# Patient Record
Sex: Female | Born: 1970 | Race: White | Hispanic: No | Marital: Married | Smoking: Never smoker
Health system: Southern US, Community
[De-identification: ages and names within clinical notes are randomized; demographics above are authoritative.]

## PROBLEM LIST (undated history)

## (undated) DIAGNOSIS — G35 Multiple sclerosis: Secondary | ICD-10-CM

## (undated) DIAGNOSIS — F32A Depression, unspecified: Secondary | ICD-10-CM

## (undated) DIAGNOSIS — E119 Type 2 diabetes mellitus without complications: Secondary | ICD-10-CM

## (undated) DIAGNOSIS — A630 Anogenital (venereal) warts: Secondary | ICD-10-CM

## (undated) DIAGNOSIS — F329 Major depressive disorder, single episode, unspecified: Secondary | ICD-10-CM

## (undated) DIAGNOSIS — H539 Unspecified visual disturbance: Secondary | ICD-10-CM

## (undated) DIAGNOSIS — E7800 Pure hypercholesterolemia, unspecified: Secondary | ICD-10-CM

## (undated) DIAGNOSIS — F419 Anxiety disorder, unspecified: Secondary | ICD-10-CM

## (undated) DIAGNOSIS — R200 Anesthesia of skin: Secondary | ICD-10-CM

## (undated) DIAGNOSIS — C801 Malignant (primary) neoplasm, unspecified: Secondary | ICD-10-CM

## (undated) DIAGNOSIS — I10 Essential (primary) hypertension: Secondary | ICD-10-CM

## (undated) HISTORY — DX: Unspecified visual disturbance: H53.9

## (undated) HISTORY — DX: Anxiety disorder, unspecified: F41.9

## (undated) HISTORY — DX: Multiple sclerosis: G35

## (undated) HISTORY — PX: COLPOSCOPY: SHX161

## (undated) HISTORY — PX: CERVICAL BIOPSY  W/ LOOP ELECTRODE EXCISION: SUR135

## (undated) HISTORY — DX: Major depressive disorder, single episode, unspecified: F32.9

## (undated) HISTORY — DX: Anogenital (venereal) warts: A63.0

## (undated) HISTORY — PX: ABDOMINAL HYSTERECTOMY: SHX81

## (undated) HISTORY — DX: Type 2 diabetes mellitus without complications: E11.9

## (undated) HISTORY — DX: Depression, unspecified: F32.A

## (undated) HISTORY — DX: Malignant (primary) neoplasm, unspecified: C80.1

---

## 1992-10-18 HISTORY — PX: DILATION AND CURETTAGE OF UTERUS: SHX78

## 2016-01-17 DIAGNOSIS — G35 Multiple sclerosis: Secondary | ICD-10-CM

## 2016-01-17 HISTORY — DX: Multiple sclerosis: G35

## 2016-02-03 ENCOUNTER — Observation Stay (HOSPITAL_COMMUNITY): Payer: 59

## 2016-02-03 ENCOUNTER — Emergency Department (HOSPITAL_COMMUNITY): Payer: 59

## 2016-02-03 ENCOUNTER — Encounter (HOSPITAL_COMMUNITY): Payer: Self-pay | Admitting: Emergency Medicine

## 2016-02-03 ENCOUNTER — Inpatient Hospital Stay (HOSPITAL_COMMUNITY)
Admission: EM | Admit: 2016-02-03 | Discharge: 2016-02-09 | DRG: 060 | Disposition: A | Discharge status: Home / Self Care | Payer: 59 | Attending: Internal Medicine | Admitting: Internal Medicine

## 2016-02-03 DIAGNOSIS — R471 Dysarthria and anarthria: Secondary | ICD-10-CM

## 2016-02-03 DIAGNOSIS — J3089 Other allergic rhinitis: Secondary | ICD-10-CM | POA: Diagnosis not present

## 2016-02-03 DIAGNOSIS — I10 Essential (primary) hypertension: Secondary | ICD-10-CM | POA: Diagnosis not present

## 2016-02-03 DIAGNOSIS — R202 Paresthesia of skin: Secondary | ICD-10-CM | POA: Diagnosis present

## 2016-02-03 DIAGNOSIS — Z791 Long term (current) use of non-steroidal anti-inflammatories (NSAID): Secondary | ICD-10-CM

## 2016-02-03 DIAGNOSIS — G969 Disorder of central nervous system, unspecified: Secondary | ICD-10-CM | POA: Diagnosis present

## 2016-02-03 DIAGNOSIS — J309 Allergic rhinitis, unspecified: Secondary | ICD-10-CM | POA: Diagnosis present

## 2016-02-03 DIAGNOSIS — R4701 Aphasia: Secondary | ICD-10-CM | POA: Diagnosis present

## 2016-02-03 DIAGNOSIS — R4781 Slurred speech: Secondary | ICD-10-CM | POA: Diagnosis present

## 2016-02-03 DIAGNOSIS — R208 Other disturbances of skin sensation: Secondary | ICD-10-CM | POA: Diagnosis not present

## 2016-02-03 DIAGNOSIS — T502X5A Adverse effect of carbonic-anhydrase inhibitors, benzothiadiazides and other diuretics, initial encounter: Secondary | ICD-10-CM | POA: Diagnosis not present

## 2016-02-03 DIAGNOSIS — E785 Hyperlipidemia, unspecified: Secondary | ICD-10-CM | POA: Diagnosis present

## 2016-02-03 DIAGNOSIS — R200 Anesthesia of skin: Secondary | ICD-10-CM | POA: Diagnosis present

## 2016-02-03 DIAGNOSIS — R209 Unspecified disturbances of skin sensation: Secondary | ICD-10-CM | POA: Diagnosis present

## 2016-02-03 DIAGNOSIS — G35 Multiple sclerosis: Secondary | ICD-10-CM | POA: Diagnosis not present

## 2016-02-03 DIAGNOSIS — M4802 Spinal stenosis, cervical region: Secondary | ICD-10-CM | POA: Diagnosis present

## 2016-02-03 DIAGNOSIS — E876 Hypokalemia: Secondary | ICD-10-CM | POA: Diagnosis not present

## 2016-02-03 DIAGNOSIS — Z79899 Other long term (current) drug therapy: Secondary | ICD-10-CM

## 2016-02-03 HISTORY — DX: Pure hypercholesterolemia, unspecified: E78.00

## 2016-02-03 HISTORY — DX: Essential (primary) hypertension: I10

## 2016-02-03 LAB — I-STAT CHEM 8, ED
BUN: 17 mg/dL (ref 6–20)
CALCIUM ION: 1.19 mmol/L (ref 1.12–1.23)
CHLORIDE: 103 mmol/L (ref 101–111)
Creatinine, Ser: 0.7 mg/dL (ref 0.44–1.00)
GLUCOSE: 108 mg/dL — AB (ref 65–99)
HCT: 51 % — ABNORMAL HIGH (ref 36.0–46.0)
Hemoglobin: 17.3 g/dL — ABNORMAL HIGH (ref 12.0–15.0)
Potassium: 3.4 mmol/L — ABNORMAL LOW (ref 3.5–5.1)
Sodium: 139 mmol/L (ref 135–145)
TCO2: 22 mmol/L (ref 0–100)

## 2016-02-03 LAB — DIFFERENTIAL
BASOS ABS: 0 10*3/uL (ref 0.0–0.1)
BASOS PCT: 0 %
EOS ABS: 0.2 10*3/uL (ref 0.0–0.7)
Eosinophils Relative: 2 %
Lymphocytes Relative: 32 %
Lymphs Abs: 2.5 10*3/uL (ref 0.7–4.0)
Monocytes Absolute: 0.5 10*3/uL (ref 0.1–1.0)
Monocytes Relative: 6 %
NEUTROS PCT: 60 %
Neutro Abs: 4.7 10*3/uL (ref 1.7–7.7)

## 2016-02-03 LAB — COMPREHENSIVE METABOLIC PANEL
ALBUMIN: 4.6 g/dL (ref 3.5–5.0)
ALT: 26 U/L (ref 14–54)
AST: 23 U/L (ref 15–41)
Alkaline Phosphatase: 61 U/L (ref 38–126)
Anion gap: 14 (ref 5–15)
BUN: 15 mg/dL (ref 6–20)
CHLORIDE: 102 mmol/L (ref 101–111)
CO2: 20 mmol/L — ABNORMAL LOW (ref 22–32)
Calcium: 10.2 mg/dL (ref 8.9–10.3)
Creatinine, Ser: 0.74 mg/dL (ref 0.44–1.00)
GFR calc Af Amer: >60 mL/min (ref >60)
GFR calc non Af Amer: >60 mL/min (ref >60)
GLUCOSE: 104 mg/dL — AB (ref 65–99)
POTASSIUM: 3.5 mmol/L (ref 3.5–5.1)
SODIUM: 136 mmol/L (ref 135–145)
Total Bilirubin: 0.8 mg/dL (ref 0.3–1.2)
Total Protein: 8.7 g/dL — ABNORMAL HIGH (ref 6.5–8.1)

## 2016-02-03 LAB — PROTIME-INR
INR: 1 (ref 0.00–1.49)
Prothrombin Time: 13.4 seconds (ref 11.6–15.2)

## 2016-02-03 LAB — APTT: APTT: 30 s (ref 24–37)

## 2016-02-03 LAB — CBC
HCT: 44.6 % (ref 36.0–46.0)
HEMOGLOBIN: 14.4 g/dL (ref 12.0–15.0)
MCH: 26.1 pg (ref 26.0–34.0)
MCHC: 32.3 g/dL (ref 30.0–36.0)
MCV: 80.9 fL (ref 78.0–100.0)
Platelets: 266 10*3/uL (ref 150–400)
RBC: 5.51 MIL/uL — ABNORMAL HIGH (ref 3.87–5.11)
RDW: 13.9 % (ref 11.5–15.5)
WBC: 7.9 10*3/uL (ref 4.0–10.5)

## 2016-02-03 MED ORDER — VITAMIN C 500 MG PO TABS
500.0000 mg | ORAL_TABLET | Freq: Every day | ORAL | Status: DC
  Administered 2016-02-04 – 2016-02-09 (×6): 500 mg via ORAL
  Filled 2016-02-03 (×6): qty 1

## 2016-02-03 MED ORDER — ONDANSETRON HCL 4 MG/2ML IJ SOLN: 4.0000 mg | Freq: Four times a day (QID) | INTRAMUSCULAR | Status: DC | PRN

## 2016-02-03 MED ORDER — LISINOPRIL-HYDROCHLOROTHIAZIDE 20-12.5 MG PO TABS: 1.0000 | ORAL_TABLET | Freq: Every day | ORAL | Status: DC

## 2016-02-03 MED ORDER — HYDROCODONE-ACETAMINOPHEN 5-325 MG PO TABS: 1.0000 | ORAL_TABLET | ORAL | Status: DC | PRN

## 2016-02-03 MED ORDER — NAPROXEN SODIUM 220 MG PO TABS: 220.0000 mg | ORAL_TABLET | Freq: Every day | ORAL | Status: DC

## 2016-02-03 MED ORDER — BISACODYL 5 MG PO TBEC: 5.0000 mg | DELAYED_RELEASE_TABLET | Freq: Every day | ORAL | Status: DC | PRN

## 2016-02-03 MED ORDER — VITAMIN D 1000 UNITS PO TABS
1000.0000 [IU] | ORAL_TABLET | Freq: Every day | ORAL | Status: DC
  Administered 2016-02-04 – 2016-02-09 (×6): 1000 [IU] via ORAL
  Filled 2016-02-03 (×8): qty 1

## 2016-02-03 MED ORDER — MONTELUKAST SODIUM 10 MG PO TABS
10.0000 mg | ORAL_TABLET | Freq: Every day | ORAL | Status: DC
  Administered 2016-02-04 – 2016-02-08 (×5): 10 mg via ORAL
  Filled 2016-02-03 (×5): qty 1

## 2016-02-03 MED ORDER — ACETAMINOPHEN 325 MG PO TABS: 650.0000 mg | ORAL_TABLET | Freq: Four times a day (QID) | ORAL | Status: DC | PRN

## 2016-02-03 MED ORDER — ONDANSETRON HCL 4 MG PO TABS: 4.0000 mg | ORAL_TABLET | Freq: Four times a day (QID) | ORAL | Status: DC | PRN

## 2016-02-03 MED ORDER — LORATADINE 10 MG PO TABS
10.0000 mg | ORAL_TABLET | Freq: Every day | ORAL | Status: DC
  Administered 2016-02-04 – 2016-02-09 (×6): 10 mg via ORAL
  Filled 2016-02-03 (×6): qty 1

## 2016-02-03 MED ORDER — ACETAMINOPHEN 650 MG RE SUPP: 650.0000 mg | Freq: Four times a day (QID) | RECTAL | Status: DC | PRN

## 2016-02-03 MED ORDER — ENOXAPARIN SODIUM 40 MG/0.4ML ~~LOC~~ SOLN
40.0000 mg | SUBCUTANEOUS | Status: DC
  Administered 2016-02-04 – 2016-02-08 (×6): 40 mg via SUBCUTANEOUS
  Filled 2016-02-03 (×6): qty 0.4

## 2016-02-03 MED ORDER — POLYETHYLENE GLYCOL 3350 17 G PO PACK: 17.0000 g | PACK | Freq: Every day | ORAL | Status: DC | PRN

## 2016-02-03 NOTE — ED Notes (Signed)
Ptmoob to br with steady gait.

## 2016-02-03 NOTE — Consult Note (Signed)
Neurology Consultation Reason for Consult: dysarthria and left hand tingling Referring Physician: ED  CC: as above  History is obtained from patient  HPI: Beverly Beltran is a 45 y.o. female hx of ebesity, HTN and prediabetes.  P/w dysarthria starting last Wednesday and without improvement since.  Around same time also had left arm numbness.  Denies any prior neurological sx.  This week has had trouble urinating.  pateint takes aleve every day.  occassional gait unsteadiness as well. No fevers or chills.  No fam hx of MS or autoimmune dz    ROS: A 14 point ROS was performed and is negative except as noted in the HPI.  Past Medical History  Diagnosis Date  . Hypertension   . Hypercholesteremia     History reviewed. No pertinent family history.  Social History:  reports that she has never smoked. She does not have any smokeless tobacco history on file. She reports that she does not drink alcohol or use illicit drugs.  Exam: Current vital signs: BP 110/66 mmHg  Pulse 76  Temp(Src) 98.9 F (37.2 C) (Oral)  Resp 10  SpO2 99%  LMP 01/16/2016 Vital signs in last 24 hours: Temp:  [98.9 F (37.2 C)] 98.9 F (37.2 C) (04/18 1454) Pulse Rate:  [65-94] 76 (04/18 2200) Resp:  [8-19] 10 (04/18 1900) BP: (101-146)/(59-103) 110/66 mmHg (04/18 2200) SpO2:  [96 %-100 %] 99 % (04/18 2200)   Physical Exam  Constitutional: Appears well-developed and well-nourished.  Psych: Affect appropriate to situation Eyes: No scleral injection HENT: No OP obstrucion Head: Normocephalic.  Cardiovascular: Normal rate and regular rhythm.  Respiratory: Effort normal and breath sounds normal to anterior ascultation GI: Soft.  No distension. There is no tenderness.  Skin: WDI  Neuro: Mental Status: Patient is awake, alert, oriented to person, place, month, year, and situation Patient is able to give a clear and coherent history No signs of aphasia or neglect but she has minor but obvious  dysarthria Cranial Nerves: II: Visual Fields are full. Pupils are equal, round, and reactive to light. III,IV, VI: EOMI without ptosis or diploplia.  V: Facial sensation is symmetric to temperature VII: Facial movement is symmetric.  VIII: hearing is intact to voice X: Uvula elevates symmetrically XI: Shoulder shrug is symmetric. XII: tongue is midline without atrophy or fasciculations.  Motor: Tone is normal. Bulk is normal. 5/5 strength was present in all four extremities. Sensory: Decreased sensation in left arm - else normal Deep Tendon Reflexes: 2+ and symmetric in the biceps and patellae Plantars: Toes are downgoing bilaterally. Cerebellar: FNF and HKS are intact bilaterally    I have reviewed labs and imaging in epic and the results pertinent to this consultation are:  MRI and MRI with contrast C and T spine MRI   Impression: possible MS - needs C and T spine MRI for dx.  Also needs MRI +/- to better understand lesions.  Will sign out to to nandigan.  Will follow MRI ;ater and decide whether steroids are necessary.  Recommendations: 1) as above

## 2016-02-03 NOTE — ED Notes (Signed)
Patient transported to MRI 

## 2016-02-03 NOTE — H&P (Signed)
History and Physical    Beverly Beltran IWO:[id number] DOB: 03-09-1971 DOA: 02/03/2016  Referring Provider: EDP PCP: No primary care provider on file.  Outpatient Specialists: None listed  Patient coming from: home   Chief Complaint: Slurred speech  HPI: Beverly Beltran is a 45 y.o. female with medical history significant for hypertension, hyperlipidemia, and allergic rhinitis who presents to the ED with slurred speech and left arm paresthesia of 6 days duration. Patient reports that she typically enjoys fairly good health and was at her baseline until approximately 6 days ago when she developed some slurred speech. She had been in the process of moving with her family from [place] and attributed the speech changed to her exhaustion after moving boxes all day. The next day, the speech difficulty persisted and now remains unchanged from time of onset. Over the same interval, the patient is noted numbness in her left upper extremity extending from near the shoulder down through to the hand. Patient denies any chest pain or palpitations. She denies any headaches, vision change, or hearing loss. There is been no loss of coordination or confusion. Patient does not drink alcohol and denies use of illicit drugs. She has never experienced similar symptoms previously.   ED Course: Upon arrival to the ED, patient is found to be afebrile, saturating well on room air, and with vital signs stable. EKG features a normal sinus rhythm and head CT without contrast is negative for acute intracranial abnormality. CMP is unremarkable and CBC is entirely within the normal limits. MRI of the brain was obtained and notable for a 16 mm dominant lesion in the subcortical white matter of the left middle frontal gyrus with suspicion for MS. Neurology was consulted by the EDP and has evaluated the patient in the ER. Admission to the hospital as recommended with MRI of the spinal cord. Neurology will follow up on the imaging and  make a determination as to whether steroids are recommended.  Review of Systems:  All other systems reviewed and apart from HPI, are negative.  Past Medical History  Diagnosis Date  . Hypertension   . Hypercholesteremia     History reviewed. No pertinent past surgical history.   reports that she has never smoked. She does not have any smokeless tobacco history on file. She reports that she does not drink alcohol or use illicit drugs.  No Known Allergies  History reviewed. No pertinent family history.   Prior to Admission medications   Medication Sig Start Date End Date Taking? Authorizing Provider  cetirizine (ZYRTEC) 10 MG tablet Take 10 mg by mouth daily.   Yes Historical Provider, MD  cholecalciferol (VITAMIN D) 1000 units tablet Take 1,000 Units by mouth daily.   Yes Historical Provider, MD  Cyanocobalamin (VITAMIN B 12 PO) Take 1 tablet by mouth daily.    Yes Historical Provider, MD  Lactobacillus (PROBIOTIC ACIDOPHILUS PO) Take 1 capsule by mouth daily.   Yes Historical Provider, MD  lisinopril-hydrochlorothiazide (PRINZIDE,ZESTORETIC) 20-12.5 MG tablet Take 1 tablet by mouth daily.   Yes Historical Provider, MD  MAGNESIUM PO Take by mouth.   Yes Historical Provider, MD  montelukast (SINGULAIR) 10 MG tablet Take 10 mg by mouth at bedtime.   Yes Historical Provider, MD  naproxen sodium (ANAPROX) 220 MG tablet Take 220 mg by mouth at bedtime.   Yes Historical Provider, MD  vitamin C (ASCORBIC ACID) 500 MG tablet Take 500 mg by mouth daily.   Yes Historical Provider, MD    Physical Exam: Danley Danker  Vitals:   02/03/16 2145 02/03/16 2200 02/03/16 2230 02/03/16 2300  BP: 132/84 110/66 109/63 107/62  Pulse: 84 76 78 78  Temp:      TempSrc:      Resp:      SpO2: 99% 99% 99% 98%      Constitutional: NAD, calm, comfortable Eyes: PERTLA, lids and conjunctivae normal ENMT: Mucous membranes are moist. Posterior pharynx clear of any exudate or lesions. Normal dentition.  Neck:  normal, supple, no masses, no thyromegaly Respiratory: clear to auscultation bilaterally, no wheezing, no crackles. Normal respiratory effort. No accessory muscle use.  Cardiovascular: S1 & S2 heard, regular rate and rhythm, no murmurs / rubs / gallops. No extremity edema. 2+ pedal pulses. No carotid bruits.  Abdomen: No distension, no tenderness, no masses palpated. No hepatosplenomegaly. Bowel sounds normal.  Musculoskeletal: no clubbing / cyanosis. No joint deformity upper and lower extremities. Good ROM, no contractures. Normal muscle tone.  Skin: no rashes, lesions, ulcers. No induration Neurologic: CN 2-12 grossly intact. Sensation intact, DTR normal. Strength 5/5 in all 4 limbs.  Psychiatric: Normal judgment and insight. Alert and oriented x 3. Normal mood.     Labs on Admission: I have personally reviewed following labs and imaging studies  CBC:  Recent Labs Lab 02/03/16 1426 02/03/16 1454  WBC 7.9  --   NEUTROABS 4.7  --   HGB 14.4 17.3*  HCT 44.6 51.0*  MCV 80.9  --   PLT 266  --    Basic Metabolic Panel:  Recent Labs Lab 02/03/16 1426 02/03/16 1454  NA 136 139  K 3.5 3.4*  CL 102 103  CO2 20*  --   GLUCOSE 104* 108*  BUN 15 17  CREATININE 0.74 0.70  CALCIUM 10.2  --    GFR: CrCl cannot be calculated (Unknown ideal weight.). Liver Function Tests:  Recent Labs Lab 02/03/16 1426  AST 23  ALT 26  ALKPHOS 61  BILITOT 0.8  PROT 8.7*  ALBUMIN 4.6   No results for input(s): LIPASE, AMYLASE in the last 168 hours. No results for input(s): AMMONIA in the last 168 hours. Coagulation Profile:  Recent Labs Lab 02/03/16 1426  INR 1.00   Cardiac Enzymes: No results for input(s): CKTOTAL, CKMB, CKMBINDEX, TROPONINI in the last 168 hours. BNP (last 3 results) No results for input(s): PROBNP in the last 8760 hours. HbA1C: No results for input(s): HGBA1C in the last 72 hours. CBG: No results for input(s): GLUCAP in the last 168 hours. Lipid Profile: No  results for input(s): CHOL, HDL, LDLCALC, TRIG, CHOLHDL, LDLDIRECT in the last 72 hours. Thyroid Function Tests: No results for input(s): TSH, T4TOTAL, FREET4, T3FREE, THYROIDAB in the last 72 hours. Anemia Panel: No results for input(s): VITAMINB12, FOLATE, FERRITIN, TIBC, IRON, RETICCTPCT in the last 72 hours. Urine analysis: No results found for: COLORURINE, APPEARANCEUR, LABSPEC, PHURINE, GLUCOSEU, HGBUR, BILIRUBINUR, KETONESUR, PROTEINUR, UROBILINOGEN, NITRITE, LEUKOCYTESUR Sepsis Labs: @LABRCNTIP (procalcitonin:4,lacticidven:4) )No results found for this or any previous visit (from the past 240 hour(s)).   Radiological Exams on Admission: Ct Head Wo Contrast  02/03/2016  CLINICAL DATA:  Slurred speech for 6 days. Gait disturbance. Dyscoordination. EXAM: CT HEAD WITHOUT CONTRAST TECHNIQUE: Contiguous axial images were obtained from the base of the skull through the vertex without intravenous contrast. COMPARISON:  None. FINDINGS: The lateral and third ventricles are normal in size and configuration. There is enlargement of the fourth ventricle. 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 There is no intracranial mass,  hemorrhage, extra-axial fluid collection, or midline shift. No gray or white compartment lesions are evident. No acute infarct evident. The bony calvarium appears intact. The mastoid air cells are clear. No intraorbital lesions are evident. 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 Lateral and third ventricles appear normal. No intracranial mass, hemorrhage, or focal gray - white compartment lesion. No acute infarct evident. Electronically Signed   By: Lowella Grip III M.D.   On: 02/03/2016 14:45   Mr Brain Wo Contrast  02/03/2016  CLINICAL DATA:  45 year old female with slurred speech for 6 days. Mild facial droop, dropping things with her left hand this week. Initial encounter.  EXAM: MRI HEAD WITHOUT CONTRAST TECHNIQUE: Multiplanar, multiecho pulse sequences of the brain and surrounding structures were obtained without intravenous contrast. COMPARISON:  Noncontrast head CT 1438 hours today FINDINGS: 16 mm round area of abnormal white matter T2 and FLAIR hyperintensity associated with increased trace diffusion signal, but appears facilitated on ADC. This is in the left middle frontal gyrus (series 10, image 16) in proximity to Broca's area. Mild if any associated mass effect. This is largely occult on the earlier CT. Multiple additional smaller bilateral cerebral white matter lesions including some periventricular and subcortical areas of involvement elsewhere in both hemispheres. The temporal lobes appear spared. No definite corpus callosum involvement. None of these are restricted or conspicuous on diffusion weighted imaging. Cerebral volume is within normal limits. Mega cisterna magna variant. No restricted diffusion to suggest acute infarction. No midline shift, ventriculomegaly, extra-axial collection or acute intracranial hemorrhage. Cervicomedullary junction and pituitary are within normal limits. Major intracranial vascular flow voids are preserved with mild vertebrobasilar tortuosity. No cortical encephalomalacia or chronic cerebral blood products. Signal in the deep gray matter, brainstem, and cerebellum is normal. Visible internal auditory structures appear normal. Mastoids are clear. Trace paranasal sinus mucosal thickening. Negative orbit soft tissues. Grossly negative visualized cervical spine. Negative scalp soft tissues. Visualized bone marrow signal is within normal limits. IMPRESSION: 1. Dominant lesion measuring 16 mm in the subcortical white matter of the left middle frontal gyrus is favored to be the symptomatic abnormality. The noncontrast appearance of that lesion, as well as the presence of multiple additional age advanced white matter signal changes, favors Multiple  Sclerosis. Other inflammatory, infectious, or neoplastic considerations are all less likely. Recommend Neurology consultation. 2. No deep gray matter or posterior fossa involvement, and otherwise negative noncontrast MRI appearance of the brain. Electronically Signed   By: Genevie Ann M.D.   On: 02/03/2016 20:24    EKG: Independently reviewed. Sinus rhythm  Assessment/Plan  1. Aphasia, left arm paresthesia  - Subcortical white matter lesion noted on MRI suspicious for MS  - Neurology consulting and much appreciated  - Spinal cord MRI pending; neurology to review and decide on whether to treat with steroids   2. Hypertension  - At goal currently  - Managed with lisinopril and HCTZ at home, will continue    3. Allergic rhinitis   - Stable  - Continue Singulair, antihistamine     DVT prophylaxis: sq Lovenox  Code Status: Full   Family Communication: Husband at bedside  Disposition Plan: Admit to med-surg  Consults called: Neurology   Admission status: Observation   Vianne Bulls, MD [facility] Pager [phone]  If 7PM-7AM, please contact night-coverage [email or web address] Password [facility]  02/03/2016, 11:24 PM

## 2016-02-03 NOTE — ED Provider Notes (Signed)
CSN: [id number]     Arrival date & time 02/03/16  1347 History   First MD Initiated Contact with Patient 02/03/16 1503     Chief Complaint  Patient presents with  . Aphasia     (Consider location/radiation/quality/duration/timing/severity/associated sxs/prior Treatment) Patient is a 45 y.o. female presenting with weakness. The history is provided by the patient (Patient states Wednesday night she noticed that she would occasionally slur speech she's also had some occasional numbness and weakness in her left hand).  Weakness This is a new problem. The current episode started more than 2 days ago. The problem occurs constantly. The problem has not changed since onset.Pertinent negatives include no chest pain, no abdominal pain and no headaches. Nothing aggravates the symptoms. Nothing relieves the symptoms.    Past Medical History  Diagnosis Date  . Hypertension   . Hypercholesteremia    History reviewed. No pertinent past surgical history. History reviewed. No pertinent family history. Social History  Substance Use Topics  . Smoking status: Never Smoker   . Smokeless tobacco: None  . Alcohol Use: No   OB History    No data available     Review of Systems  Constitutional: Negative for appetite change and fatigue.  HENT: Negative for congestion, ear discharge and sinus pressure.   Eyes: Negative for discharge.  Respiratory: Negative for cough.   Cardiovascular: Negative for chest pain.  Gastrointestinal: Negative for abdominal pain and diarrhea.  Genitourinary: Negative for frequency and hematuria.  Musculoskeletal: Negative for back pain.  Skin: Negative for rash.  Neurological: Positive for speech difficulty and weakness. Negative for seizures and headaches.  Psychiatric/Behavioral: Negative for hallucinations.      Allergies  Review of patient's allergies indicates no known allergies.  Home Medications   Prior to Admission medications   Medication Sig Start Date  End Date Taking? Authorizing Provider  cetirizine (ZYRTEC) 10 MG tablet Take 10 mg by mouth daily.   Yes Historical Provider, MD  cholecalciferol (VITAMIN D) 1000 units tablet Take 1,000 Units by mouth daily.   Yes Historical Provider, MD  Cyanocobalamin (VITAMIN B 12 PO) Take 1 tablet by mouth daily.    Yes Historical Provider, MD  Lactobacillus (PROBIOTIC ACIDOPHILUS PO) Take 1 capsule by mouth daily.   Yes Historical Provider, MD  lisinopril-hydrochlorothiazide (PRINZIDE,ZESTORETIC) 20-12.5 MG tablet Take 1 tablet by mouth daily.   Yes Historical Provider, MD  MAGNESIUM PO Take by mouth.   Yes Historical Provider, MD  montelukast (SINGULAIR) 10 MG tablet Take 10 mg by mouth at bedtime.   Yes Historical Provider, MD  naproxen sodium (ANAPROX) 220 MG tablet Take 220 mg by mouth at bedtime.   Yes Historical Provider, MD  vitamin C (ASCORBIC ACID) 500 MG tablet Take 500 mg by mouth daily.   Yes Historical Provider, MD   BP 112/70 mmHg  Pulse 75  Temp(Src) 98.9 F (37.2 C) (Oral)  Resp 15  SpO2 100% Physical Exam  Constitutional: She is oriented to person, place, and time. She appears well-developed.  HENT:  Head: Normocephalic.  Eyes: Conjunctivae and EOM are normal. No scleral icterus.  Neck: Neck supple. No thyromegaly present.  Cardiovascular: Normal rate and regular rhythm.  Exam reveals no gallop and no friction rub.   No murmur heard. Pulmonary/Chest: No stridor. She has no wheezes. She has no rales. She exhibits no tenderness.  Abdominal: She exhibits no distension. There is no tenderness. There is no rebound.  Musculoskeletal: Normal range of motion. She exhibits no edema.  Lymphadenopathy:    She has no cervical adenopathy.  Neurological: She is oriented to person, place, and time. She exhibits normal muscle tone. Coordination normal.  Very minimal slurring of speech. Sounds like patient has a very minor lisp at times  Skin: No rash noted. No erythema.  Psychiatric: She has a  normal mood and affect. Her behavior is normal.    ED Course  Procedures (including critical care time) Labs Review Labs Reviewed  CBC - Abnormal; Notable for the following:    RBC 5.51 (*)    All other components within normal limits  COMPREHENSIVE METABOLIC PANEL - Abnormal; Notable for the following:    CO2 20 (*)    Glucose, Bld 104 (*)    Total Protein 8.7 (*)    All other components within normal limits  I-STAT CHEM 8, ED - Abnormal; Notable for the following:    Potassium 3.4 (*)    Glucose, Bld 108 (*)    Hemoglobin 17.3 (*)    HCT 51.0 (*)    All other components within normal limits  PROTIME-INR  APTT  DIFFERENTIAL  I-STAT TROPOININ, ED  CBG MONITORING, ED    Imaging Review Ct Head Wo Contrast  02/03/2016  CLINICAL DATA:  Slurred speech for 6 days. Gait disturbance. Dyscoordination. EXAM: CT HEAD WITHOUT CONTRAST TECHNIQUE: Contiguous axial images were obtained from the base of the skull through the vertex without intravenous contrast. COMPARISON:  None. FINDINGS: The lateral and third ventricles are normal in size and configuration. There is enlargement of the fourth ventricle. 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 There is no intracranial mass, hemorrhage, extra-axial fluid collection, or midline shift. No gray or white compartment lesions are evident. No acute infarct evident. The bony calvarium appears intact. The mastoid air cells are clear. No intraorbital lesions are evident. 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 Lateral and third ventricles appear normal. No intracranial mass, hemorrhage, or focal gray - white compartment lesion. No acute infarct evident. Electronically Signed   By: Lowella Grip III M.D.   On: 02/03/2016 14:45   I have personally reviewed and evaluated these images and lab results as part of my medical decision-making.   EKG  Interpretation None      MDM   Final diagnoses:  None    Slurred speech.  Mri suggests ms   Milton Ferguson, MD 02/04/16 1319

## 2016-02-03 NOTE — ED Notes (Signed)
Pt here with slurred speech and aphasia x 6 days; pt noted to have mild facial droop; pt sts dropping some things with left hand this week

## 2016-02-03 NOTE — ED Notes (Signed)
Dr. Wendee Beavers and Rapid response at bedside

## 2016-02-03 NOTE — ED Notes (Signed)
IV attempted x2 without success.

## 2016-02-03 NOTE — ED Notes (Signed)
Pt awaiting MRI

## 2016-02-04 DIAGNOSIS — E785 Hyperlipidemia, unspecified: Secondary | ICD-10-CM

## 2016-02-04 DIAGNOSIS — E876 Hypokalemia: Secondary | ICD-10-CM | POA: Diagnosis not present

## 2016-02-04 DIAGNOSIS — J309 Allergic rhinitis, unspecified: Secondary | ICD-10-CM | POA: Diagnosis present

## 2016-02-04 DIAGNOSIS — R4701 Aphasia: Secondary | ICD-10-CM

## 2016-02-04 DIAGNOSIS — Z79899 Other long term (current) drug therapy: Secondary | ICD-10-CM | POA: Diagnosis not present

## 2016-02-04 DIAGNOSIS — R200 Anesthesia of skin: Secondary | ICD-10-CM | POA: Diagnosis present

## 2016-02-04 DIAGNOSIS — J3089 Other allergic rhinitis: Secondary | ICD-10-CM | POA: Diagnosis present

## 2016-02-04 DIAGNOSIS — T502X5A Adverse effect of carbonic-anhydrase inhibitors, benzothiadiazides and other diuretics, initial encounter: Secondary | ICD-10-CM | POA: Diagnosis not present

## 2016-02-04 DIAGNOSIS — M4802 Spinal stenosis, cervical region: Secondary | ICD-10-CM

## 2016-02-04 DIAGNOSIS — R202 Paresthesia of skin: Secondary | ICD-10-CM

## 2016-02-04 DIAGNOSIS — G35 Multiple sclerosis: Secondary | ICD-10-CM | POA: Diagnosis present

## 2016-02-04 DIAGNOSIS — R209 Unspecified disturbances of skin sensation: Secondary | ICD-10-CM | POA: Diagnosis present

## 2016-02-04 DIAGNOSIS — I10 Essential (primary) hypertension: Secondary | ICD-10-CM | POA: Diagnosis present

## 2016-02-04 DIAGNOSIS — Z791 Long term (current) use of non-steroidal anti-inflammatories (NSAID): Secondary | ICD-10-CM | POA: Diagnosis not present

## 2016-02-04 DIAGNOSIS — R4781 Slurred speech: Secondary | ICD-10-CM | POA: Diagnosis present

## 2016-02-04 DIAGNOSIS — G969 Disorder of central nervous system, unspecified: Secondary | ICD-10-CM | POA: Diagnosis not present

## 2016-02-04 LAB — GLUCOSE, CAPILLARY: GLUCOSE-CAPILLARY: 106 mg/dL — AB (ref 65–99)

## 2016-02-04 MED ORDER — SODIUM CHLORIDE 0.9 % IV SOLN
1000.0000 mg | Freq: Every day | INTRAVENOUS | Status: DC
  Administered 2016-02-04 – 2016-02-05 (×2): 1000 mg via INTRAVENOUS
  Filled 2016-02-04 (×2): qty 8

## 2016-02-04 MED ORDER — GADOBENATE DIMEGLUMINE 529 MG/ML IV SOLN
20.0000 mL | Freq: Once | INTRAVENOUS | Status: AC | PRN
  Administered 2016-02-04: 20 mL via INTRAVENOUS

## 2016-02-04 MED ORDER — LISINOPRIL 20 MG PO TABS
20.0000 mg | ORAL_TABLET | Freq: Every day | ORAL | Status: DC
  Administered 2016-02-04 – 2016-02-09 (×6): 20 mg via ORAL
  Filled 2016-02-04 (×6): qty 1

## 2016-02-04 MED ORDER — HYDROCHLOROTHIAZIDE 12.5 MG PO CAPS
12.5000 mg | ORAL_CAPSULE | Freq: Every day | ORAL | Status: DC
  Administered 2016-02-04 – 2016-02-09 (×6): 12.5 mg via ORAL
  Filled 2016-02-04 (×6): qty 1

## 2016-02-04 MED ORDER — NAPROXEN 250 MG PO TABS
250.0000 mg | ORAL_TABLET | Freq: Every day | ORAL | Status: DC
  Administered 2016-02-04 – 2016-02-08 (×6): 250 mg via ORAL
  Filled 2016-02-04 (×6): qty 1

## 2016-02-04 MED ORDER — POTASSIUM CHLORIDE CRYS ER 20 MEQ PO TBCR
20.0000 meq | EXTENDED_RELEASE_TABLET | Freq: Every day | ORAL | Status: DC
  Administered 2016-02-04 – 2016-02-05 (×2): 20 meq via ORAL
  Filled 2016-02-04 (×2): qty 1

## 2016-02-04 NOTE — Progress Notes (Signed)
Progress Note    PROGRESS NOTE  Beverly Beltran  WJX:[id number] DOB: [birth year]  DOA: 02/03/2016 PCP: No primary care provider on file.   Outpatient Specialists:   None.   Brief Narrative:   Beverly Beltran is an 45 y.o. female with a PMH of hypertension, hyperlipidemia and allergic rhinitis who was admitted 02/03/16 with a 6 day history of slurred speech and left arm paresthesias. MRI of the brain was obtained and notable for a 16 mm dominant lesion in the subcortical white matter of the left middle frontal gyrus with suspicion for MS. Neurology was consulted by the EDP and evaluated the patient in the ER.   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 Brain MRI showed a left frontal lobe enhancing lesion characteristic of active demyelination. No cervical or thoracic spinal demyelinating lesions. High-dose steroids initiated by neurology.  Active Problems:   Cervical spinal stenosis/paresthesia of left arm MRI of the cervical spine shows severe right and moderate to severe left C5-6 neural foraminal narrowing. Steroids might improve.    Hypokalemia Likely from diuretics. Will place on routine low dose supplementation.    Hypertension Continue lisinopril/HCTZ.    Hyperlipidemia Heart healthy diet.    Other allergic rhinitis Continue Singulair and Claritin.    Family Communication/Anticipated D/C date and plan/Code Status   DVT prophylaxis: Lovenox ordered. Code Status: Full Code.  Family Communication: No family currently at the bedside. Disposition Plan: Home in several days once complete with high-dose steroids.   Medical Consultants:    Neurology   Procedures:    Anti-Infectives:   Anti-infectives    None      Subjective:   Beverly Beltran continues to have speech changes  including sensation that she has a new list and some mild word finding difficulty. Continues to have left arm/hand paresthesias. No dyspnea, pain, or nausea/vomiting.  Objective:    Filed Vitals:   02/03/16 2230 02/03/16 2300 02/04/16 0155 02/04/16 0447  BP: 109/63 107/62 125/81 128/75  Pulse: 78 78 81 87  Temp:   98 F (36.7 C) 98.2 F (36.8 C)  TempSrc:   Oral Oral  Resp:   16 18  Height:   5' 3"  (1.6 m)   Weight:   93.078 kg (205 lb 3.2 oz)   SpO2: 99% 98% 99% 97%    Intake/Output Summary (Last 24 hours) at 02/04/16 0830 Last data filed at 02/04/16 0200  Gross per 24 hour  Intake    240 ml  Output      0 ml  Net    240 ml   Filed Weights   02/04/16 0155  Weight: 93.078 kg (205 lb 3.2 oz)    Exam: General exam: Appears calm and comfortable.  Respiratory system: Clear to auscultation. Respiratory effort normal. Cardiovascular system: S1 & S2 heard, RRR. No JVD, murmurs, rubs, gallops or clicks. No pedal edema. Gastrointestinal system: Abdomen is nondistended, soft and nontender. No organomegaly or masses felt. Normal bowel sounds heard. Central nervous system: Alert and oriented. No focal neurological deficits. Extremities: Symmetric 5 x 5 power. No clubbing, edema, or cyanosis. Skin: No rashes, lesions or ulcers Psychiatry: Judgement and insight appear normal. Mood & affect appropriate.   Data Reviewed:   I have personally reviewed following labs and imaging studies:  Labs: Basic Metabolic Panel:  Recent Labs Lab 02/03/16 1426 02/03/16 1454  NA 136 139  K 3.5 3.4*  CL 102 103  CO2 20*  --   GLUCOSE 104* 108*  BUN 15 17  CREATININE 0.74 0.70  CALCIUM 10.2  --    GFR Estimated Creatinine Clearance: 97.3 mL/min (by C-G formula based on Cr of 0.7). Liver Function Tests:  Recent Labs Lab 02/03/16 1426  AST 23  ALT 26  ALKPHOS 61  BILITOT 0.8  PROT 8.7*  ALBUMIN 4.6   Coagulation profile  Recent Labs Lab 02/03/16 1426  INR 1.00     CBC:  Recent Labs Lab 02/03/16 1426 02/03/16 1454  WBC 7.9  --   NEUTROABS 4.7  --   HGB 14.4 17.3*  HCT 44.6 51.0*  MCV 80.9  --   PLT 266  --    CBG:  Recent Labs Lab 02/04/16 0609  GLUCAP 106*   Microbiology No results found for this or any previous visit (from the past 240 hour(s)).  Radiology: Ct Head Wo Contrast  02/03/2016  CLINICAL DATA:  Slurred speech for 6 days. Gait disturbance. Dyscoordination. EXAM: CT HEAD WITHOUT CONTRAST TECHNIQUE: Contiguous axial images were obtained from the base of the skull through the vertex without intravenous contrast. COMPARISON:  None. FINDINGS: The lateral and third ventricles are normal in size and configuration. There is enlargement of the fourth ventricle. 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 There is no intracranial mass, hemorrhage, extra-axial fluid collection, or midline shift. No gray or white compartment lesions are evident. No acute infarct evident. The bony calvarium appears intact. The mastoid air cells are clear. No intraorbital lesions are evident. 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 Lateral and third ventricles appear normal. No intracranial mass, hemorrhage, or focal gray - white compartment lesion. No acute infarct evident. Electronically Signed   By: Lowella Grip III M.D.   On: 02/03/2016 14:45   Mr Brain Wo Contrast  02/03/2016  CLINICAL DATA:  45 year old female with slurred speech for 6 days. Mild facial droop, dropping things with her left hand this week. Initial encounter. EXAM: MRI HEAD WITHOUT CONTRAST TECHNIQUE: Multiplanar, multiecho pulse sequences of the brain and surrounding structures were obtained without intravenous contrast. COMPARISON:  Noncontrast head CT 1438 hours today FINDINGS: 16 mm round area of abnormal white matter T2 and FLAIR hyperintensity associated with increased trace  diffusion signal, but appears facilitated on ADC. This is in the left middle frontal gyrus (series 10, image 16) in proximity to Broca's area. Mild if any associated mass effect. This is largely occult on the earlier CT. Multiple additional smaller bilateral cerebral white matter lesions including some periventricular and subcortical areas of involvement elsewhere in both hemispheres. The temporal lobes appear spared. No definite corpus callosum involvement. None of these are restricted or conspicuous on diffusion weighted imaging. Cerebral volume is within normal limits. Mega cisterna magna variant. No restricted diffusion to suggest acute infarction. No midline shift, ventriculomegaly, extra-axial collection or acute intracranial hemorrhage. Cervicomedullary junction and pituitary are within normal limits. Major intracranial vascular flow voids are preserved with mild vertebrobasilar tortuosity. No cortical encephalomalacia or chronic cerebral blood products. Signal in the deep gray matter, brainstem, and cerebellum is normal. Visible internal auditory structures appear normal. Mastoids are clear. Trace paranasal sinus mucosal thickening. Negative orbit soft tissues. Grossly negative visualized cervical spine. Negative scalp soft tissues. Visualized bone marrow signal is within normal limits. IMPRESSION: 1. Dominant lesion measuring 16 mm in the subcortical  white matter of the left middle frontal gyrus is favored to be the symptomatic abnormality. The noncontrast appearance of that lesion, as well as the presence of multiple additional age advanced white matter signal changes, favors Multiple Sclerosis. Other inflammatory, infectious, or neoplastic considerations are all less likely. Recommend Neurology consultation. 2. No deep gray matter or posterior fossa involvement, and otherwise negative noncontrast MRI appearance of the brain. Electronically Signed   By: Genevie Ann M.D.   On: 02/03/2016 20:24   Mr Brain W  Contrast  02/04/2016  CLINICAL DATA:  Dysarthria beginning 6 days ago, LEFT arm numbness. Symptoms not improving. Difficulty urinating, and unsteady gait. History of multiple sclerosis, hypertension and hypercholesterolemia. EXAM: MRI HEAD WITH CONTRAST TECHNIQUE: Multiplanar, multiecho pulse sequences of the brain and surrounding structures were obtained with intravenous contrast. COMPARISON:  MRI of the brain without contrast February 03, 2016 CONTRAST:  40m MULTIHANCE GADOBENATE DIMEGLUMINE 529 MG/ML IV SOLN FINDINGS: 15 x 12 mm peripherally enhancing LEFT frontal lobe mass corresponding to FLAIR abnormality from yesterday's MRI, with central area nonenhancing halo type appearance. No additional enhancing lesions. No abnormal extra-axial enhancement or extra-axial masses. IMPRESSION: 15 x 12 mm LEFT frontal lobe enhancing lesion with imaging characteristics of active demyelination. Electronically Signed   By: CElon AlasM.D.   On: 02/04/2016 01:26   Mr Cervical Spine Wo Contrast  02/04/2016  CLINICAL DATA:  Dysarthria beginning 6 days ago, LEFT arm numbness. Symptoms not improving. Difficulty urinating. Unsteady gait. History of multiple sclerosis, hypertension and hypercholesterolemia. EXAM: MRI CERVICAL SPINE WITHOUT CONTRAST TECHNIQUE: Multiplanar, multisequence MR imaging of the cervical spine was performed. No intravenous contrast was administered. COMPARISON:  MRI of the brain January 24, 2016 at 1941 hours FINDINGS: Cervical vertebral bodies intact and aligned with straightened cervical lordosis. Moderate C5-6 disc height loss, remaining disc morphology's maintained. Mild chronic discogenic endplate changes CB7-6and C5-6. No bone marrow signal abnormality to suggest acute osseous process. Cervical spinal cord is normal morphology and signal characteristics from the cervical medullary junction to level of T2-3 , the most caudal well visualized level. Included prevertebral and paraspinal soft tissues  are normal. Level by level evaluation: C2-3: No disc bulge, canal stenosis nor neural foraminal narrowing. C3-4: Tiny central disc protrusion without canal stenosis. Mild RIGHT facet arthropathy and mild RIGHT neural foraminal narrowing. C4-5: Tiny central disc protrusion. Mild facet arthropathy without canal stenosis or neural foraminal narrowing. C5-6: 3 mm broad-based RIGHT central disc protrusion results in mild canal stenosis. Severe RIGHT, moderate to severe LEFT neural foraminal narrowing. C6-7 and C7-T1: No disc bulge, canal stenosis nor neural foraminal narrowing. IMPRESSION: No MR findings of cervical spine acute nor chronic demyelination. Degenerative change of the cervical spine results in mild canal stenosis at C5-6. Severe RIGHT and moderate to severe LEFT C5-6 neural foraminal narrowing. Electronically Signed   By: CElon AlasM.D.   On: 02/04/2016 00:43   Mr Thoracic Spine Wo Contrast  02/04/2016  CLINICAL DATA:  Dysarthria for 6 days, not improving. LEFT arm numbness. History of hypertension, hypercholesterolemia and multiple sclerosis. EXAM: MRI THORACIC SPINE WITHOUT CONTRAST TECHNIQUE: Multiplanar, multisequence MR imaging of the thoracic spine was performed. No intravenous contrast was administered. COMPARISON:  MRI of the cervical spine February 03, 2016. FINDINGS: Thoracic vertebral bodies intact and aligned and maintenance of thoracic kyphosis. Intervertebral disc morphology is generally preserved with slightly decreased T2 signal within the mid to lower thoracic disc compatible with mild desiccation and mild subacute to chronic discogenic endplate  changes. No STIR signal abnormality to suggest acute osseous process. Multilevel ventral thoracic spinal cord deformity as described below due to disc extrusions without MR findings of cord edema. No myelomalacia. No syrinx. Included prevertebral and paraspinal soft tissues are normal. At T7-8 is a 5 x 8 mm (AP by transverse) RIGHT central  disc extrusion without migration deforming the RIGHT ventral spinal cord and, resulting in mild canal stenosis. At T8-9 is a 7 x 6 mm (AP by transverse) central disc extrusion with 10 mm of superior and to lesser extent inferior migration deforming the ventral spinal cord at resulting in mild canal stenosis. At T9-10 is a 4 x 6 mm (AP by transverse) central disc extrusion with 9 mm of superior contiguous migration mildly deforming the ventral spinal cord and resulting in mild canal stenosis. No neural foraminal narrowing at any level. IMPRESSION: No MR findings of acute nor chronic demyelination in the thoracic spine. 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 No neural foraminal narrowing. Electronically Signed   By: Elon Alas M.D.   On: 02/04/2016 01:13    Medications:   . cholecalciferol  1,000 Units Oral Daily  . enoxaparin (LOVENOX) injection  40 mg Subcutaneous Q24H  . lisinopril  20 mg Oral Daily   And  . hydrochlorothiazide  12.5 mg Oral Daily  . loratadine  10 mg Oral Daily  . methylPREDNISolone (SOLU-MEDROL) injection  1,000 mg Intravenous Daily  . montelukast  10 mg Oral QHS  . naproxen  250 mg Oral QHS  . vitamin C  500 mg Oral Daily   Continuous Infusions:   Time spent: 35 minutes with > 50% of time discussing current diagnostic test results, clinical impression and plan of care.   LOS: 1 day   RAMA,CHRISTINA  [facility] Pager [phone]. If unable to reach me by pager, please call my cell phone at [phone].  *Please refer to [email or web address], password [email or web address] to get updated schedule on who will round on this patient, as hospitalists switch teams weekly. If 7PM-7AM, please contact night-coverage at [email or web address], password [email or web address] for any overnight needs.  02/04/2016, 8:30 AM

## 2016-02-04 NOTE — Care Management Note (Signed)
Case Management Note  Patient Details  Name: Beverly Beltran MRN: [id number] Date of Birth: April 13, 1971  Subjective/Objective:                    Action/Plan: Patient admitted with CNS lesion. She is from home with her spouse. CM following for discharge needs.   Expected Discharge Date:                  Expected Discharge Plan:     In-House Referral:     Discharge planning Services     Post Acute Care Choice:    Choice offered to:     DME Arranged:    DME Agency:     HH Arranged:    HH Agency:     Status of Service:  In process, will continue to follow  Medicare Important Message Given:    Date Medicare IM Given:    Medicare IM give by:    Date Additional Medicare IM Given:    Additional Medicare Important Message give by:     If discussed at [place] of Stay Meetings, dates discussed:    Additional Comments:  Pollie Friar, RN 02/04/2016, 3:14 PM

## 2016-02-04 NOTE — Progress Notes (Signed)
Pt arrived on unit 0130 hrs, A&O, no C/O, no obvious distress, admission orders implemented, pt oriented to room and equipment.

## 2016-02-05 LAB — GLUCOSE, CAPILLARY: GLUCOSE-CAPILLARY: 157 mg/dL — AB (ref 65–99)

## 2016-02-05 MED ORDER — PANTOPRAZOLE SODIUM 40 MG PO TBEC
40.0000 mg | DELAYED_RELEASE_TABLET | Freq: Every day | ORAL | Status: DC
  Administered 2016-02-05 – 2016-02-09 (×5): 40 mg via ORAL
  Filled 2016-02-05 (×5): qty 1

## 2016-02-05 MED ORDER — SODIUM CHLORIDE 0.9 % IV SOLN
250.0000 mg | Freq: Four times a day (QID) | INTRAVENOUS | Status: DC
  Administered 2016-02-05 – 2016-02-09 (×15): 250 mg via INTRAVENOUS
  Filled 2016-02-05 (×22): qty 2

## 2016-02-05 NOTE — Progress Notes (Signed)
Pt has questions and concerns about test results requesting to speak with Neurologist after speaking with MD.  Neuro MD and PA paged per pt request.

## 2016-02-05 NOTE — Progress Notes (Signed)
Progress Note    PROGRESS NOTE  Beverly Beltran  KGU:[id number] DOB: October 18, 1971  DOA: 02/03/2016 PCP: No primary care provider on file.   Outpatient Specialists:   None.   Brief Narrative:   Beverly Beltran is an 45 y.o. female with a PMH of hypertension, hyperlipidemia and allergic rhinitis who was admitted 02/03/16 with a 6 day history of slurred speech and left arm paresthesias. MRI of the brain was obtained and notable for a 16 mm dominant lesion in the subcortical white matter of the left middle frontal gyrus with suspicion for MS. Neurology was consulted by the EDP and evaluated the patient in the ER.   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 Brain MRI showed a left frontal lobe enhancing lesion characteristic of active demyelination. No cervical or thoracic spinal demyelinating lesions. High-dose steroids initiated by neurology.  Active Problems:   Cervical spinal stenosis/paresthesia of left arm MRI of the cervical spine shows severe right and moderate to severe left C5-6 neural foraminal narrowing. Steroids might improve.    Hypokalemia Likely from diuretics. Will place on routine low dose supplementation.    Hypertension Continue lisinopril/HCTZ.    Hyperlipidemia Heart healthy diet.    Other allergic rhinitis Continue Singulair and Claritin.    Family Communication/Anticipated D/C date and plan/Code Status   DVT prophylaxis: Lovenox ordered. Code Status: Full Code.  Family Communication: Husband and children at the bedside. Disposition Plan: Home in several days once complete with high-dose steroids.   Medical Consultants:    Neurology   Procedures:    Anti-Infectives:   Anti-infectives    None      Subjective:   Beverly Beltran says she thinks her speech has  improved.  She does not have any paresthesias today.  No new complaints.  Appetite OK.    Objective:    Filed Vitals:   02/04/16 1758 02/04/16 2116 02/05/16 0120 02/05/16 0645  BP: 116/74 135/75 121/63 129/69  Pulse: 109 104 84 80  Temp: 98.8 F (37.1 C) 98.3 F (36.8 C) 97.9 F (36.6 C) 97.9 F (36.6 C)  TempSrc: Oral Oral Oral Oral  Resp: 18 18 18 18   Height:      Weight:      SpO2: 95% 97% 96% 97%   No intake or output data in the 24 hours ending 02/05/16 0814 Filed Weights   02/04/16 0155  Weight: 93.078 kg (205 lb 3.2 oz)    Exam: General exam: Appears calm and comfortable.  Respiratory system: Clear to auscultation. Respiratory effort normal. Cardiovascular system: S1 & S2 heard, RRR. No JVD, murmurs, rubs, gallops or clicks. No pedal edema. Gastrointestinal system: Abdomen is nondistended, soft and nontender. No organomegaly or masses felt. Normal bowel sounds heard. Central nervous system: Alert and oriented. No focal neurological deficits. Extremities: Symmetric 5 x 5 power. No clubbing, edema, or cyanosis. Skin: No rashes, lesions or ulcers Psychiatry: Judgement and insight appear normal. Mood & affect appropriate.   Data Reviewed:   I have personally reviewed following labs and imaging studies:  Labs: Basic Metabolic Panel:  Recent Labs Lab 02/03/16 1426 02/03/16 1454  NA 136 139  K 3.5 3.4*  CL 102 103  CO2 20*  --   GLUCOSE 104* 108*  BUN 15 17  CREATININE 0.74 0.70  CALCIUM 10.2  --    GFR Estimated Creatinine  Clearance: 97.3 mL/min (by C-G formula based on Cr of 0.7). Liver Function Tests:  Recent Labs Lab 02/03/16 1426  AST 23  ALT 26  ALKPHOS 61  BILITOT 0.8  PROT 8.7*  ALBUMIN 4.6   Coagulation profile  Recent Labs Lab 02/03/16 1426  INR 1.00    CBC:  Recent Labs Lab 02/03/16 1426 02/03/16 1454  WBC 7.9  --   NEUTROABS 4.7  --   HGB 14.4 17.3*  HCT 44.6 51.0*  MCV 80.9  --   PLT 266  --    CBG:  Recent  Labs Lab 02/04/16 0609 02/05/16 0631  GLUCAP 106* 157*   Microbiology No results found for this or any previous visit (from the past 240 hour(s)).  Radiology: Ct Head Wo Contrast  02/03/2016  CLINICAL DATA:  Slurred speech for 6 days. Gait disturbance. Dyscoordination. EXAM: CT HEAD WITHOUT CONTRAST TECHNIQUE: Contiguous axial images were obtained from the base of the skull through the vertex without intravenous contrast. COMPARISON:  None. FINDINGS: The lateral and third ventricles are normal in size and configuration. There is enlargement of the fourth ventricle. 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 There is no intracranial mass, hemorrhage, extra-axial fluid collection, or midline shift. No gray or white compartment lesions are evident. No acute infarct evident. The bony calvarium appears intact. The mastoid air cells are clear. No intraorbital lesions are evident. 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 Lateral and third ventricles appear normal. No intracranial mass, hemorrhage, or focal gray - white compartment lesion. No acute infarct evident. Electronically Signed   By: Lowella Grip III M.D.   On: 02/03/2016 14:45   Mr Brain Wo Contrast  02/03/2016  CLINICAL DATA:  45 year old female with slurred speech for 6 days. Mild facial droop, dropping things with her left hand this week. Initial encounter. EXAM: MRI HEAD WITHOUT CONTRAST TECHNIQUE: Multiplanar, multiecho pulse sequences of the brain and surrounding structures were obtained without intravenous contrast. COMPARISON:  Noncontrast head CT 1438 hours today FINDINGS: 16 mm round area of abnormal white matter T2 and FLAIR hyperintensity associated with increased trace diffusion signal, but appears facilitated on ADC. This is in the left middle frontal gyrus (series 10, image 16) in proximity to Broca's area. Mild if any associated  mass effect. This is largely occult on the earlier CT. Multiple additional smaller bilateral cerebral white matter lesions including some periventricular and subcortical areas of involvement elsewhere in both hemispheres. The temporal lobes appear spared. No definite corpus callosum involvement. None of these are restricted or conspicuous on diffusion weighted imaging. Cerebral volume is within normal limits. Mega cisterna magna variant. No restricted diffusion to suggest acute infarction. No midline shift, ventriculomegaly, extra-axial collection or acute intracranial hemorrhage. Cervicomedullary junction and pituitary are within normal limits. Major intracranial vascular flow voids are preserved with mild vertebrobasilar tortuosity. No cortical encephalomalacia or chronic cerebral blood products. Signal in the deep gray matter, brainstem, and cerebellum is normal. Visible internal auditory structures appear normal. Mastoids are clear. Trace paranasal sinus mucosal thickening. Negative orbit soft tissues. Grossly negative visualized cervical spine. Negative scalp soft tissues. Visualized bone marrow signal is within normal limits. IMPRESSION: 1. Dominant lesion measuring 16 mm in the subcortical white matter of the left middle frontal gyrus is favored to be the symptomatic abnormality. The noncontrast appearance of that lesion, as well as the presence of multiple additional age advanced white matter signal changes, favors Multiple  Sclerosis. Other inflammatory, infectious, or neoplastic considerations are all less likely. Recommend Neurology consultation. 2. No deep gray matter or posterior fossa involvement, and otherwise negative noncontrast MRI appearance of the brain. Electronically Signed   By: Genevie Ann M.D.   On: 02/03/2016 20:24   Mr Brain W Contrast  02/04/2016  CLINICAL DATA:  Dysarthria beginning 6 days ago, LEFT arm numbness. Symptoms not improving. Difficulty urinating, and unsteady gait. History of  multiple sclerosis, hypertension and hypercholesterolemia. EXAM: MRI HEAD WITH CONTRAST TECHNIQUE: Multiplanar, multiecho pulse sequences of the brain and surrounding structures were obtained with intravenous contrast. COMPARISON:  MRI of the brain without contrast February 03, 2016 CONTRAST:  55m MULTIHANCE GADOBENATE DIMEGLUMINE 529 MG/ML IV SOLN FINDINGS: 15 x 12 mm peripherally enhancing LEFT frontal lobe mass corresponding to FLAIR abnormality from yesterday's MRI, with central area nonenhancing halo type appearance. No additional enhancing lesions. No abnormal extra-axial enhancement or extra-axial masses. IMPRESSION: 15 x 12 mm LEFT frontal lobe enhancing lesion with imaging characteristics of active demyelination. Electronically Signed   By: CElon AlasM.D.   On: 02/04/2016 01:26   Mr Cervical Spine Wo Contrast  02/04/2016  CLINICAL DATA:  Dysarthria beginning 6 days ago, LEFT arm numbness. Symptoms not improving. Difficulty urinating. Unsteady gait. History of multiple sclerosis, hypertension and hypercholesterolemia. EXAM: MRI CERVICAL SPINE WITHOUT CONTRAST TECHNIQUE: Multiplanar, multisequence MR imaging of the cervical spine was performed. No intravenous contrast was administered. COMPARISON:  MRI of the brain January 24, 2016 at 1941 hours FINDINGS: Cervical vertebral bodies intact and aligned with straightened cervical lordosis. Moderate C5-6 disc height loss, remaining disc morphology's maintained. Mild chronic discogenic endplate changes CY0-7and C5-6. No bone marrow signal abnormality to suggest acute osseous process. Cervical spinal cord is normal morphology and signal characteristics from the cervical medullary junction to level of T2-3 , the most caudal well visualized level. Included prevertebral and paraspinal soft tissues are normal. Level by level evaluation: C2-3: No disc bulge, canal stenosis nor neural foraminal narrowing. C3-4: Tiny central disc protrusion without canal stenosis.  Mild RIGHT facet arthropathy and mild RIGHT neural foraminal narrowing. C4-5: Tiny central disc protrusion. Mild facet arthropathy without canal stenosis or neural foraminal narrowing. C5-6: 3 mm broad-based RIGHT central disc protrusion results in mild canal stenosis. Severe RIGHT, moderate to severe LEFT neural foraminal narrowing. C6-7 and C7-T1: No disc bulge, canal stenosis nor neural foraminal narrowing. IMPRESSION: No MR findings of cervical spine acute nor chronic demyelination. Degenerative change of the cervical spine results in mild canal stenosis at C5-6. Severe RIGHT and moderate to severe LEFT C5-6 neural foraminal narrowing. Electronically Signed   By: CElon AlasM.D.   On: 02/04/2016 00:43   Mr Thoracic Spine Wo Contrast  02/04/2016  CLINICAL DATA:  Dysarthria for 6 days, not improving. LEFT arm numbness. History of hypertension, hypercholesterolemia and multiple sclerosis. EXAM: MRI THORACIC SPINE WITHOUT CONTRAST TECHNIQUE: Multiplanar, multisequence MR imaging of the thoracic spine was performed. No intravenous contrast was administered. COMPARISON:  MRI of the cervical spine February 03, 2016. FINDINGS: Thoracic vertebral bodies intact and aligned and maintenance of thoracic kyphosis. Intervertebral disc morphology is generally preserved with slightly decreased T2 signal within the mid to lower thoracic disc compatible with mild desiccation and mild subacute to chronic discogenic endplate changes. No STIR signal abnormality to suggest acute osseous process. Multilevel ventral thoracic spinal cord deformity as described below due to disc extrusions without MR findings of cord edema. No myelomalacia. No syrinx. Included prevertebral and paraspinal  soft tissues are normal. At T7-8 is a 5 x 8 mm (AP by transverse) RIGHT central disc extrusion without migration deforming the RIGHT ventral spinal cord and, resulting in mild canal stenosis. At T8-9 is a 7 x 6 mm (AP by transverse) central disc  extrusion with 10 mm of superior and to lesser extent inferior migration deforming the ventral spinal cord at resulting in mild canal stenosis. At T9-10 is a 4 x 6 mm (AP by transverse) central disc extrusion with 9 mm of superior contiguous migration mildly deforming the ventral spinal cord and resulting in mild canal stenosis. No neural foraminal narrowing at any level. IMPRESSION: No MR findings of acute nor chronic demyelination in the thoracic spine. 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 No neural foraminal narrowing. Electronically Signed   By: Elon Alas M.D.   On: 02/04/2016 01:13    Medications:   . cholecalciferol  1,000 Units Oral Daily  . enoxaparin (LOVENOX) injection  40 mg Subcutaneous Q24H  . lisinopril  20 mg Oral Daily   And  . hydrochlorothiazide  12.5 mg Oral Daily  . loratadine  10 mg Oral Daily  . methylPREDNISolone (SOLU-MEDROL) injection  1,000 mg Intravenous Daily  . montelukast  10 mg Oral QHS  . naproxen  250 mg Oral QHS  . potassium chloride  20 mEq Oral Daily  . vitamin C  500 mg Oral Daily   Continuous Infusions:   Time spent: 35 minutes with > 50% of time discussing current diagnostic test results, clinical impression and plan of care with the patient's husband.   LOS: 2 days   RAMA,CHRISTINA  [facility] Pager [phone]. If unable to reach me by pager, please call my cell phone at [phone].  *Please refer to [email or web address], password [email or web address] to get updated schedule on who will round on this patient, as hospitalists switch teams weekly. If 7PM-7AM, please contact night-coverage at [email or web address], password [email or web address] for any overnight needs.  02/05/2016, 8:14 AM

## 2016-02-06 LAB — BASIC METABOLIC PANEL
Anion gap: 14 (ref 5–15)
BUN: 19 mg/dL (ref 6–20)
CHLORIDE: 103 mmol/L (ref 101–111)
CO2: 20 mmol/L — ABNORMAL LOW (ref 22–32)
CREATININE: 0.84 mg/dL (ref 0.44–1.00)
Calcium: 9.9 mg/dL (ref 8.9–10.3)
GFR calc Af Amer: >60 mL/min (ref >60)
GFR calc non Af Amer: >60 mL/min (ref >60)
GLUCOSE: 150 mg/dL — AB (ref 65–99)
Potassium: 4.7 mmol/L (ref 3.5–5.1)
SODIUM: 137 mmol/L (ref 135–145)

## 2016-02-06 LAB — GLUCOSE, CAPILLARY: GLUCOSE-CAPILLARY: 139 mg/dL — AB (ref 65–99)

## 2016-02-06 MED ORDER — POTASSIUM CHLORIDE CRYS ER 10 MEQ PO TBCR
10.0000 meq | EXTENDED_RELEASE_TABLET | Freq: Every day | ORAL | Status: DC
  Administered 2016-02-06 – 2016-02-09 (×4): 10 meq via ORAL
  Filled 2016-02-06 (×4): qty 1

## 2016-02-06 NOTE — Progress Notes (Signed)
Interval History:                                                                                                                      Beverly Beltran is an 45 y.o. female patient who presented to the emergency room with a new onset of word finding difficulties and slurred speech as described in the initial consultation note. Her MRI of the brain showed several demyelinating lesions intracranially in the topography highly suspicious for multiple strokes as. Follow-up contrast brain MRI showed ring enhancement of a left posterior frontal demyelinating lesion suggestive of an acute demyelinating lesion .  she's been on IV Solu-Medrol which has improved her speech symptoms. She feels that she is at her baseline now does of her speech. Intermittently she would have some mild numbness and paresthesias in the left upper extremity otherwise no other new neurological symptoms at this time.  Denies any family history of MS.  No family at bedside   Past Medical History: Past Medical History  Diagnosis Date  . Hypertension   . Hypercholesteremia     History reviewed. No pertinent past surgical history.  Family History: History reviewed. No pertinent family history.  Social History:   reports that she has never smoked. She does not have any smokeless tobacco history on file. She reports that she does not drink alcohol or use illicit drugs.  Allergies:  No Known Allergies   Medications:                                                                                                                         Current facility-administered medications:  .  acetaminophen (TYLENOL) tablet 650 mg, 650 mg, Oral, Q6H PRN **OR** acetaminophen (TYLENOL) suppository 650 mg, 650 mg, Rectal, Q6H PRN, Ilene Qua Opyd, MD .  bisacodyl (DULCOLAX) EC tablet 5 mg, 5 mg, Oral, Daily PRN, Ilene Qua Opyd, MD .  cholecalciferol (VITAMIN D) tablet 1,000 Units, 1,000 Units, Oral, Daily, Vianne Bulls, MD, 1,000 Units at  02/05/16 [phone] .  enoxaparin (LOVENOX) injection 40 mg, 40 mg, Subcutaneous, Q24H, Ilene Qua Opyd, MD, 40 mg at 02/05/16 2120 .  lisinopril (PRINIVIL,ZESTRIL) tablet 20 mg, 20 mg, Oral, Daily, 20 mg at 02/05/16 0951 **AND** hydrochlorothiazide (MICROZIDE) capsule 12.5 mg, 12.5 mg, Oral, Daily, Ilene Qua Opyd, MD, 12.5 mg at 02/05/16 1000 .  HYDROcodone-acetaminophen (NORCO/VICODIN) 5-325 MG per tablet 1-2 tablet, 1-2 tablet, Oral, Q4H PRN, Vianne Bulls, MD .  loratadine (CLARITIN) tablet  10 mg, 10 mg, Oral, Daily, Ilene Qua Opyd, MD, 10 mg at 02/05/16 7793 .  methylPREDNISolone sodium succinate (SOLU-MEDROL) 250 mg in sodium chloride 0.9 % 50 mL IVPB, 250 mg, Intravenous, Q6H, Dorien Bessent Fuller Mandril, MD, 250 mg at 02/06/16 9030 .  montelukast (SINGULAIR) tablet 10 mg, 10 mg, Oral, QHS, Ilene Qua Opyd, MD, 10 mg at 02/05/16 2120 .  naproxen (NAPROSYN) tablet 250 mg, 250 mg, Oral, QHS, Ilene Qua Opyd, MD, 250 mg at 02/05/16 2120 .  ondansetron (ZOFRAN) tablet 4 mg, 4 mg, Oral, Q6H PRN **OR** ondansetron (ZOFRAN) injection 4 mg, 4 mg, Intravenous, Q6H PRN, Ilene Qua Opyd, MD .  pantoprazole (PROTONIX) EC tablet 40 mg, 40 mg, Oral, Daily, Narvel Kozub Fuller Mandril, MD, 40 mg at 02/05/16 1756 .  polyethylene glycol (MIRALAX / GLYCOLAX) packet 17 g, 17 g, Oral, Daily PRN, Ilene Qua Opyd, MD .  potassium chloride (K-DUR,KLOR-CON) CR tablet 10 mEq, 10 mEq, Oral, Daily, Christina P Rama, MD .  vitamin C (ASCORBIC ACID) tablet 500 mg, 500 mg, Oral, Daily, Ilene Qua Opyd, MD, 500 mg at 02/05/16 0951   Neurologic Examination:                                                                                                     Today's Vitals   02/05/16 2125 02/05/16 2220 02/06/16 0135 02/06/16 0609  BP: 119/67  112/59 138/68  Pulse: 83  81 79  Temp: 98.1 F (36.7 C)  98.1 F (36.7 C) 98.1 F (36.7 C)  TempSrc: Oral  Oral Oral  Resp: 18  16 16   Height:      Weight:      SpO2: 96%  98% 97%    PainSc:  0-No pain      Evaluation of higher integrative functions including: Level of alertness: Alert,  Oriented to time, place and person Speech: fluent, no evidence of dysarthria or aphasia noted.  Test the following cranial nerves: 2-12 grossly intact Motor examination: Normal tone, bulk, full 5/5 motor strength in all 4 extremities Examination of sensation : Normal and symmetric sensation to pinprick in all 4 extremities and on face Test coordination: Normal finger nose testing, with no evidence of limb appendicular ataxia or abnormal involuntary movements or tremors noted.     Lab Results: Basic Metabolic Panel:  Recent Labs Lab 02/03/16 1426 02/03/16 1454 02/06/16 0558  NA 136 139 137  K 3.5 3.4* 4.7  CL 102 103 103  CO2 20*  --  20*  GLUCOSE 104* 108* 150*  BUN 15 17 19   CREATININE 0.74 0.70 0.84  CALCIUM 10.2  --  9.9    Liver Function Tests:  Recent Labs Lab 02/03/16 1426  AST 23  ALT 26  ALKPHOS 61  BILITOT 0.8  PROT 8.7*  ALBUMIN 4.6   No results for input(s): LIPASE, AMYLASE in the last 168 hours. No results for input(s): AMMONIA in the last 168 hours.  CBC:  Recent Labs Lab 02/03/16 1426 02/03/16 1454  WBC 7.9  --   NEUTROABS 4.7  --  HGB 14.4 17.3*  HCT 44.6 51.0*  MCV 80.9  --   PLT 266  --     Cardiac Enzymes: No results for input(s): CKTOTAL, CKMB, CKMBINDEX, TROPONINI in the last 168 hours.  Lipid Panel: No results for input(s): CHOL, TRIG, HDL, CHOLHDL, VLDL, LDLCALC in the last 168 hours.  CBG:  Recent Labs Lab 02/04/16 0609 02/05/16 0631 02/06/16 0612  GLUCAP 106* 157* 139*    Microbiology: No results found for this or any previous visit.  Imaging: Ct Head Wo Contrast  02/03/2016  CLINICAL DATA:  Slurred speech for 6 days. Gait disturbance. Dyscoordination. EXAM: CT HEAD WITHOUT CONTRAST TECHNIQUE: Contiguous axial images were obtained from the base of the skull through the vertex without intravenous contrast.  COMPARISON:  None. FINDINGS: The lateral and third ventricles are normal in size and configuration. There is enlargement of the fourth ventricle. 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 There is no intracranial mass, hemorrhage, extra-axial fluid collection, or midline shift. No gray or white compartment lesions are evident. No acute infarct evident. The bony calvarium appears intact. The mastoid air cells are clear. No intraorbital lesions are evident. 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 Lateral and third ventricles appear normal. No intracranial mass, hemorrhage, or focal gray - white compartment lesion. No acute infarct evident. Electronically Signed   By: Lowella Grip III M.D.   On: 02/03/2016 14:45   Mr Brain Wo Contrast  02/03/2016  CLINICAL DATA:  45 year old female with slurred speech for 6 days. Mild facial droop, dropping things with her left hand this week. Initial encounter. EXAM: MRI HEAD WITHOUT CONTRAST TECHNIQUE: Multiplanar, multiecho pulse sequences of the brain and surrounding structures were obtained without intravenous contrast. COMPARISON:  Noncontrast head CT 1438 hours today FINDINGS: 16 mm round area of abnormal white matter T2 and FLAIR hyperintensity associated with increased trace diffusion signal, but appears facilitated on ADC. This is in the left middle frontal gyrus (series 10, image 16) in proximity to Broca's area. Mild if any associated mass effect. This is largely occult on the earlier CT. Multiple additional smaller bilateral cerebral white matter lesions including some periventricular and subcortical areas of involvement elsewhere in both hemispheres. The temporal lobes appear spared. No definite corpus callosum involvement. None of these are restricted or conspicuous on diffusion weighted imaging. Cerebral volume is within normal limits. Mega cisterna magna  variant. No restricted diffusion to suggest acute infarction. No midline shift, ventriculomegaly, extra-axial collection or acute intracranial hemorrhage. Cervicomedullary junction and pituitary are within normal limits. Major intracranial vascular flow voids are preserved with mild vertebrobasilar tortuosity. No cortical encephalomalacia or chronic cerebral blood products. Signal in the deep gray matter, brainstem, and cerebellum is normal. Visible internal auditory structures appear normal. Mastoids are clear. Trace paranasal sinus mucosal thickening. Negative orbit soft tissues. Grossly negative visualized cervical spine. Negative scalp soft tissues. Visualized bone marrow signal is within normal limits. IMPRESSION: 1. Dominant lesion measuring 16 mm in the subcortical white matter of the left middle frontal gyrus is favored to be the symptomatic abnormality. The noncontrast appearance of that lesion, as well as the presence of multiple additional age advanced white matter signal changes, favors Multiple Sclerosis. Other inflammatory, infectious, or neoplastic considerations are all less likely. Recommend Neurology consultation. 2. No deep gray matter or posterior fossa involvement, and otherwise negative noncontrast MRI appearance of the brain. Electronically Signed   By: Herminio Heads.D.  On: 02/03/2016 20:24   Mr Brain W Contrast  02/04/2016  CLINICAL DATA:  Dysarthria beginning 6 days ago, LEFT arm numbness. Symptoms not improving. Difficulty urinating, and unsteady gait. History of multiple sclerosis, hypertension and hypercholesterolemia. EXAM: MRI HEAD WITH CONTRAST TECHNIQUE: Multiplanar, multiecho pulse sequences of the brain and surrounding structures were obtained with intravenous contrast. COMPARISON:  MRI of the brain without contrast February 03, 2016 CONTRAST:  5m MULTIHANCE GADOBENATE DIMEGLUMINE 529 MG/ML IV SOLN FINDINGS: 15 x 12 mm peripherally enhancing LEFT frontal lobe mass corresponding to  FLAIR abnormality from yesterday's MRI, with central area nonenhancing halo type appearance. No additional enhancing lesions. No abnormal extra-axial enhancement or extra-axial masses. IMPRESSION: 15 x 12 mm LEFT frontal lobe enhancing lesion with imaging characteristics of active demyelination. Electronically Signed   By: CElon AlasM.D.   On: 02/04/2016 01:26   Mr Cervical Spine Wo Contrast  02/04/2016  CLINICAL DATA:  Dysarthria beginning 6 days ago, LEFT arm numbness. Symptoms not improving. Difficulty urinating. Unsteady gait. History of multiple sclerosis, hypertension and hypercholesterolemia. EXAM: MRI CERVICAL SPINE WITHOUT CONTRAST TECHNIQUE: Multiplanar, multisequence MR imaging of the cervical spine was performed. No intravenous contrast was administered. COMPARISON:  MRI of the brain January 24, 2016 at 1941 hours FINDINGS: Cervical vertebral bodies intact and aligned with straightened cervical lordosis. Moderate C5-6 disc height loss, remaining disc morphology's maintained. Mild chronic discogenic endplate changes CQ9-4and C5-6. No bone marrow signal abnormality to suggest acute osseous process. Cervical spinal cord is normal morphology and signal characteristics from the cervical medullary junction to level of T2-3 , the most caudal well visualized level. Included prevertebral and paraspinal soft tissues are normal. Level by level evaluation: C2-3: No disc bulge, canal stenosis nor neural foraminal narrowing. C3-4: Tiny central disc protrusion without canal stenosis. Mild RIGHT facet arthropathy and mild RIGHT neural foraminal narrowing. C4-5: Tiny central disc protrusion. Mild facet arthropathy without canal stenosis or neural foraminal narrowing. C5-6: 3 mm broad-based RIGHT central disc protrusion results in mild canal stenosis. Severe RIGHT, moderate to severe LEFT neural foraminal narrowing. C6-7 and C7-T1: No disc bulge, canal stenosis nor neural foraminal narrowing. IMPRESSION: No MR  findings of cervical spine acute nor chronic demyelination. Degenerative change of the cervical spine results in mild canal stenosis at C5-6. Severe RIGHT and moderate to severe LEFT C5-6 neural foraminal narrowing. Electronically Signed   By: CElon AlasM.D.   On: 02/04/2016 00:43   Mr Thoracic Spine Wo Contrast  02/04/2016  CLINICAL DATA:  Dysarthria for 6 days, not improving. LEFT arm numbness. History of hypertension, hypercholesterolemia and multiple sclerosis. EXAM: MRI THORACIC SPINE WITHOUT CONTRAST TECHNIQUE: Multiplanar, multisequence MR imaging of the thoracic spine was performed. No intravenous contrast was administered. COMPARISON:  MRI of the cervical spine February 03, 2016. FINDINGS: Thoracic vertebral bodies intact and aligned and maintenance of thoracic kyphosis. Intervertebral disc morphology is generally preserved with slightly decreased T2 signal within the mid to lower thoracic disc compatible with mild desiccation and mild subacute to chronic discogenic endplate changes. No STIR signal abnormality to suggest acute osseous process. Multilevel ventral thoracic spinal cord deformity as described below due to disc extrusions without MR findings of cord edema. No myelomalacia. No syrinx. Included prevertebral and paraspinal soft tissues are normal. At T7-8 is a 5 x 8 mm (AP by transverse) RIGHT central disc extrusion without migration deforming the RIGHT ventral spinal cord and, resulting in mild canal stenosis. At T8-9 is a 7 x 6 mm (AP by  transverse) central disc extrusion with 10 mm of superior and to lesser extent inferior migration deforming the ventral spinal cord at resulting in mild canal stenosis. At T9-10 is a 4 x 6 mm (AP by transverse) central disc extrusion with 9 mm of superior contiguous migration mildly deforming the ventral spinal cord and resulting in mild canal stenosis. No neural foraminal narrowing at any level. IMPRESSION: No MR findings of acute nor chronic  demyelination in the thoracic spine. 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 No neural foraminal narrowing. Electronically Signed   By: Elon Alas M.D.   On: 02/04/2016 01:13    Assessment and plan:   Beverly Beltran is an 45 y.o. female patient withwith a new diagnosis of multiple strokes is based on abnormal brain MRI showing several demyelinating lesions in a typical topography consistent with primary demyelinating condition such as MS and presence of an acute enhancing demyelinating lesion in the left posterior frontal lobe which likely caused her speech symptoms of possible motor aphasia at presentation. MRI of the cervical and thoracic spine did not reveal any spinal cord demyelinating lesions. Of note, MRI of the cervical spine does show a large posterior disc protrusion at C5-6 level and also midthoracic posterior disc protrusions at T7-8, T8-9 and T9-10 levels, causing at least moderate spinal canal stenosis at these levels. Recommend outpatient follow-up with neurosurgery for further evaluation of this cervical and thoracic spinal Stenosis from the Posterior Disc Protrusions.   Her speech symptoms have resolved with IV Solu-Medrol. At this time, recommend a total of 5 days of IV Solu-Medrol. Her first dose of IV Solu-Medrol 1 g was given on 02/04/2016, second dose earlier today on 02/05/2016. Recommend switching the dose to 250 g every 6 hours for better tolerability, and she'll complete 5 days of steroids on 02/08/2016. Continue physical, occupational and speech therapy evaluations. After discharge from the hospital, she is advised to follow-up with neurologist specialized in treating MS patients and start disease modifying therapy for MS.  Reviewed MRI images with the patient, and answered several of her questions to her satisfaction.   will continue to follow-up .

## 2016-02-06 NOTE — Progress Notes (Signed)
Progress Note    PROGRESS NOTE  Beverly Beltran  OMB:[id number] DOB: 1971-09-17  DOA: 02/03/2016 PCP: No primary care provider on file.   Outpatient Specialists:   None.   Brief Narrative:   Beverly Beltran is an 45 y.o. female with a PMH of hypertension, hyperlipidemia and allergic rhinitis who was admitted 02/03/16 with a 6 day history of slurred speech and left arm paresthesias. MRI of the brain was obtained and notable for a 16 mm dominant lesion in the subcortical white matter of the left middle frontal gyrus with suspicion for MS. Neurology was consulted by the EDP and evaluated the patient in the ER.   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 Brain MRI showed a left frontal lobe enhancing lesion characteristic of active demyelination. No cervical or thoracic spinal demyelinating lesions. High-dose steroids initiated by neurology.  Active Problems:   Cervical spinal stenosis/paresthesia of left arm MRI of the cervical spine shows severe right and moderate to severe left C5-6 neural foraminal narrowing. Steroids might improve.Will need outpatient neurosurgical follow-up.    Hypokalemia Likely from diuretics. Resolved with supplementation. We'll decrease potassium supplement dose to 10 mEq daily.    Hypertension Continue lisinopril/HCTZ.    Hyperlipidemia Heart healthy diet.    Other allergic rhinitis Continue Singulair and Claritin.    Family Communication/Anticipated D/C date and plan/Code Status   DVT prophylaxis: Lovenox ordered. Code Status: Full Code.  Family Communication: Husband and children at the bedside 02/05/16. Disposition Plan: Home 02/08/16 once course of high-dose steroids completed.   Medical Consultants:    Neurology   Procedures:    Anti-Infectives:    Anti-infectives    None      Subjective:   [name] continues to report improvement in speech. No nausea, vomiting, diarrhea or dyspnea. Appetite is fine. No complaints of paresthesias.   Objective:    Filed Vitals:   02/05/16 1747 02/05/16 2125 02/06/16 0135 02/06/16 0609  BP: 133/70 119/67 112/59 138/68  Pulse: 94 83 81 79  Temp: 99.1 F (37.3 C) 98.1 F (36.7 C) 98.1 F (36.7 C) 98.1 F (36.7 C)  TempSrc: Oral Oral Oral Oral  Resp: 18 18 16 16   Height:      Weight:      SpO2: 96% 96% 98% 97%   No intake or output data in the 24 hours ending 02/06/16 0845 Filed Weights   02/04/16 0155  Weight: 93.078 kg (205 lb 3.2 oz)    Exam: General exam: Appears calm and comfortable.  Respiratory system: Clear to auscultation. Respiratory effort normal. Cardiovascular system: S1 & S2 heard, RRR. No JVD, murmurs, rubs, gallops or clicks. No pedal edema. Gastrointestinal system: Abdomen is nondistended, soft and nontender. No organomegaly or masses felt. Normal bowel sounds heard. Central nervous system: Alert and oriented. No focal neurological deficits. Extremities: Symmetric 5 x 5 power. No clubbing, edema, or cyanosis. Skin: No rashes, lesions or ulcers Psychiatry: Judgement and insight appear normal. Mood & affect appropriate.   Data Reviewed:   I have personally reviewed following labs and imaging studies:  Labs: Basic Metabolic Panel:  Recent Labs Lab 02/03/16 1426 02/03/16 1454 02/06/16 0558  NA 136 139 137  K 3.5 3.4* 4.7  CL 102 103 103  CO2 20*  --  20*  GLUCOSE 104* 108* 150*  BUN 15 17 19   CREATININE  0.74 0.70 0.84  CALCIUM 10.2  --  9.9   GFR Estimated Creatinine Clearance: 92.7 mL/min (by C-G formula based on Cr of 0.84). Liver Function Tests:  Recent Labs Lab 02/03/16 1426  AST 23  ALT 26  ALKPHOS 61  BILITOT 0.8  PROT 8.7*  ALBUMIN 4.6   Coagulation profile  Recent Labs Lab 02/03/16 1426  INR 1.00    CBC:  Recent  Labs Lab 02/03/16 1426 02/03/16 1454  WBC 7.9  --   NEUTROABS 4.7  --   HGB 14.4 17.3*  HCT 44.6 51.0*  MCV 80.9  --   PLT 266  --    CBG:  Recent Labs Lab 02/04/16 0609 02/05/16 0631 02/06/16 0612  GLUCAP 106* 157* 139*   Microbiology No results found for this or any previous visit (from the past 240 hour(s)).  Radiology: No results found.  Medications:   . cholecalciferol  1,000 Units Oral Daily  . enoxaparin (LOVENOX) injection  40 mg Subcutaneous Q24H  . lisinopril  20 mg Oral Daily   And  . hydrochlorothiazide  12.5 mg Oral Daily  . loratadine  10 mg Oral Daily  . methylPREDNISolone (SOLU-MEDROL) injection  250 mg Intravenous Q6H  . montelukast  10 mg Oral QHS  . naproxen  250 mg Oral QHS  . pantoprazole  40 mg Oral Daily  . potassium chloride  20 mEq Oral Daily  . vitamin C  500 mg Oral Daily   Continuous Infusions:   Time spent: 25 minutes.   LOS: 3 days   [facility] Pager [phone]. If unable to reach me by pager, please call my cell phone at [phone].  *Please refer to [email or web address], password [email or web address] to get updated schedule on who will round on this patient, as hospitalists switch teams weekly. If 7PM-7AM, please contact night-coverage at [email or web address], password [email or web address] for any overnight needs.  02/06/2016, 8:45 AM

## 2016-02-07 LAB — GLUCOSE, CAPILLARY: Glucose-Capillary: 143 mg/dL — ABNORMAL HIGH (ref 65–99)

## 2016-02-07 NOTE — Progress Notes (Signed)
Progress Note    PROGRESS NOTE  Beverly Beltran  YKD:[id number] DOB: 12-Apr-1971  DOA: 02/03/2016 PCP: No primary care provider on file.   Outpatient Specialists:   None.   Brief Narrative:   Beverly Beltran is an 45 y.o. female with a PMH of hypertension, hyperlipidemia and allergic rhinitis who was admitted 02/03/16 with a 6 day history of slurred speech and left arm paresthesias. MRI of the brain was obtained and notable for a 16 mm dominant lesion in the subcortical white matter of the left middle frontal gyrus with suspicion for MS. Neurology was consulted by the EDP and evaluated the patient in the ER.   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 Brain MRI showed a left frontal lobe enhancing lesion characteristic of active demyelination. No cervical or thoracic spinal demyelinating lesions. High-dose steroids initiated by neurology.Will complete high-dose steroids 02/08/16.  Active Problems:   Cervical spinal stenosis/paresthesia of left arm MRI of the cervical spine shows severe right and moderate to severe left C5-6 neural foraminal narrowing. Steroids might improve.Will need outpatient neurosurgical follow-up.    Hypokalemia Likely from diuretics. Resolved with supplementation. Continue potassium supplement of 10 mEq daily.    Hypertension Continue lisinopril/HCTZ.    Hyperlipidemia Heart healthy diet.    Other allergic rhinitis Continue Singulair and Claritin.    Family Communication/Anticipated D/C date and plan/Code Status   DVT prophylaxis: Lovenox ordered. Code Status: Full Code.  Family Communication: Husband and children at the bedside 02/05/16. Disposition Plan: Home 02/09/16 once course of high-dose steroids completed.   Medical Consultants:    Neurology   Procedures:     Anti-Infectives:   Anti-infectives    None      Subjective:   Beverly Beltran continues to report improvement in speech. No nausea, vomiting, diarrhea or dyspnea. Appetite is fine. No complaints of paresthesias. Anxious to go home.  Objective:    Filed Vitals:   02/06/16 1826 02/06/16 2218 02/07/16 0242 02/07/16 0644  BP: 115/66 109/71 111/59 107/59  Pulse: 78 73 62 69  Temp: 97.8 F (36.6 C) 97.7 F (36.5 C) 98.1 F (36.7 C) 98.1 F (36.7 C)  TempSrc: Oral Oral Oral Oral  Resp: 18 18 18 16   Height:      Weight:      SpO2: 98% 96% 96% 94%   No intake or output data in the 24 hours ending 02/07/16 0759 Filed Weights   02/04/16 0155  Weight: 93.078 kg (205 lb 3.2 oz)    Exam: General exam: Appears calm and comfortable.  Respiratory system: Clear to auscultation. Respiratory effort normal. Cardiovascular system: S1 & S2 heard, RRR. No JVD, murmurs, rubs, gallops or clicks. No pedal edema. Gastrointestinal system: Abdomen is nondistended, soft and nontender. No organomegaly or masses felt. Normal bowel sounds heard. Central nervous system: Alert and oriented. No focal neurological deficits. Extremities: Symmetric 5 x 5 power. No clubbing, edema, or cyanosis. Skin: No rashes, lesions or ulcers Psychiatry: Judgement and insight appear normal. Mood & affect appropriate.   Data Reviewed:   I have personally reviewed following labs and imaging studies:  Labs: Basic Metabolic Panel:  Recent Labs Lab 02/03/16 1426 02/03/16 1454 02/06/16 0558  NA 136 139 137  K 3.5 3.4* 4.7  CL 102 103 103  CO2 20*  --  20*  GLUCOSE 104* 108* 150*  BUN  15 17 19   CREATININE 0.74 0.70 0.84  CALCIUM 10.2  --  9.9   GFR Estimated Creatinine Clearance: 92.7 mL/min (by C-G formula based on Cr of 0.84). Liver Function Tests:  Recent Labs Lab 02/03/16 1426  AST 23  ALT 26  ALKPHOS 61  BILITOT 0.8  PROT 8.7*  ALBUMIN 4.6   Coagulation profile  Recent Labs Lab  02/03/16 1426  INR 1.00    CBC:  Recent Labs Lab 02/03/16 1426 02/03/16 1454  WBC 7.9  --   NEUTROABS 4.7  --   HGB 14.4 17.3*  HCT 44.6 51.0*  MCV 80.9  --   PLT 266  --    CBG:  Recent Labs Lab 02/04/16 0609 02/05/16 0631 02/06/16 0612 02/07/16 0641  GLUCAP 106* 157* 139* 143*   Microbiology No results found for this or any previous visit (from the past 240 hour(s)).  Radiology: No results found.  Medications:   . cholecalciferol  1,000 Units Oral Daily  . enoxaparin (LOVENOX) injection  40 mg Subcutaneous Q24H  . lisinopril  20 mg Oral Daily   And  . hydrochlorothiazide  12.5 mg Oral Daily  . loratadine  10 mg Oral Daily  . methylPREDNISolone (SOLU-MEDROL) injection  250 mg Intravenous Q6H  . montelukast  10 mg Oral QHS  . naproxen  250 mg Oral QHS  . pantoprazole  40 mg Oral Daily  . potassium chloride  10 mEq Oral Daily  . vitamin C  500 mg Oral Daily   Continuous Infusions:   Time spent: 15 minutes.   LOS: 4 days   [facility] Pager [phone]. If unable to reach me by pager, please call my cell phone at [phone].  *Please refer to [email or web address], password [email or web address] to get updated schedule on who will round on this patient, as hospitalists switch teams weekly. If 7PM-7AM, please contact night-coverage at [email or web address], password [email or web address] for any overnight needs.  02/07/2016, 7:59 AM

## 2016-02-08 DIAGNOSIS — G35 Multiple sclerosis: Principal | ICD-10-CM

## 2016-02-08 LAB — GLUCOSE, CAPILLARY
GLUCOSE-CAPILLARY: 278 mg/dL — AB (ref 65–99)
Glucose-Capillary: 161 mg/dL — ABNORMAL HIGH (ref 65–99)
Glucose-Capillary: 258 mg/dL — ABNORMAL HIGH (ref 65–99)
Glucose-Capillary: 160 mg/dL — ABNORMAL HIGH (ref 65–99)

## 2016-02-08 NOTE — Progress Notes (Signed)
Interval History:                                                                                                                      Beverly Beltran is an 45 y.o. female patient who presented to the emergency room with a new onset of word finding difficulties and slurred speech as described in the initial consultation note. Her MRI of the brain showed several demyelinating lesions intracranially in the topography highly suspicious for multiple strokes as. Follow-up contrast brain MRI showed ring enhancement of a left posterior frontal demyelinating lesion suggestive of an acute demyelinating lesion .    she's been on IV Solu-Medrol which has improved her speech symptoms. She feels that she is at her baseline now. No residual neuro sx.   Completing 5 days of iv solumedrol tonight.   Past Medical History: Past Medical History  Diagnosis Date  . Hypertension   . Hypercholesteremia     History reviewed. No pertinent past surgical history.  Family History: History reviewed. No pertinent family history.  Social History:   reports that she has never smoked. She does not have any smokeless tobacco history on file. She reports that she does not drink alcohol or use illicit drugs.  Allergies:  No Known Allergies   Medications:                                                                                                                         Current facility-administered medications:  .  acetaminophen (TYLENOL) tablet 650 mg, 650 mg, Oral, Q6H PRN **OR** acetaminophen (TYLENOL) suppository 650 mg, 650 mg, Rectal, Q6H PRN, Ilene Qua Opyd, MD .  bisacodyl (DULCOLAX) EC tablet 5 mg, 5 mg, Oral, Daily PRN, Ilene Qua Opyd, MD .  cholecalciferol (VITAMIN D) tablet 1,000 Units, 1,000 Units, Oral, Daily, Vianne Bulls, MD, 1,000 Units at 02/08/16 [phone] .  enoxaparin (LOVENOX) injection 40 mg, 40 mg, Subcutaneous, Q24H, Ilene Qua Opyd, MD, 40 mg at 02/07/16 2227 .  lisinopril (PRINIVIL,ZESTRIL)  tablet 20 mg, 20 mg, Oral, Daily, 20 mg at 02/08/16 0952 **AND** hydrochlorothiazide (MICROZIDE) capsule 12.5 mg, 12.5 mg, Oral, Daily, Ilene Qua Opyd, MD, 12.5 mg at 02/08/16 0952 .  HYDROcodone-acetaminophen (NORCO/VICODIN) 5-325 MG per tablet 1-2 tablet, 1-2 tablet, Oral, Q4H PRN, Ilene Qua Opyd, MD .  loratadine (CLARITIN) tablet 10 mg, 10 mg, Oral, Daily, Vianne Bulls, MD, 10 mg at 02/08/16 0953 .  methylPREDNISolone sodium succinate (SOLU-MEDROL) 250 mg in  sodium chloride 0.9 % 50 mL IVPB, 250 mg, Intravenous, Q6H, Aragon Scarantino Fuller Mandril, MD, 250 mg at 02/08/16 1714 .  montelukast (SINGULAIR) tablet 10 mg, 10 mg, Oral, QHS, Ilene Qua Opyd, MD, 10 mg at 02/07/16 2226 .  naproxen (NAPROSYN) tablet 250 mg, 250 mg, Oral, QHS, Ilene Qua Opyd, MD, 250 mg at 02/07/16 2227 .  ondansetron (ZOFRAN) tablet 4 mg, 4 mg, Oral, Q6H PRN **OR** ondansetron (ZOFRAN) injection 4 mg, 4 mg, Intravenous, Q6H PRN, Ilene Qua Opyd, MD .  pantoprazole (PROTONIX) EC tablet 40 mg, 40 mg, Oral, Daily, Alisah Grandberry Fuller Mandril, MD, 40 mg at 02/08/16 [phone] .  polyethylene glycol (MIRALAX / GLYCOLAX) packet 17 g, 17 g, Oral, Daily PRN, Ilene Qua Opyd, MD .  potassium chloride (K-DUR,KLOR-CON) CR tablet 10 mEq, 10 mEq, Oral, Daily, Venetia Maxon Rama, MD, 10 mEq at 02/08/16 0952 .  vitamin C (ASCORBIC ACID) tablet 500 mg, 500 mg, Oral, Daily, Ilene Qua Opyd, MD, 500 mg at 02/08/16 0952   Neurologic Examination:                                                                                                     Today's Vitals   02/08/16 0800 02/08/16 0908 02/08/16 1442 02/08/16 1745  BP:  136/77 131/67 130/76  Pulse:  87 69 65  Temp:  98.1 F (36.7 C) 98.1 F (36.7 C) 98.3 F (36.8 C)  TempSrc:  Oral Oral Oral  Resp:  20 20 20   Height:      Weight:      SpO2:  98% 98% 98%  PainSc: 0-No pain       Evaluation of higher integrative functions including: Level of alertness: Alert,  Oriented to time, place and  person Speech: fluent, no evidence of dysarthria or aphasia noted.  Test the following cranial nerves: 2-12 grossly intact Motor examination: Normal tone, bulk, full 5/5 motor strength in all 4 extremities Examination of sensation : Normal and symmetric sensation to pinprick in all 4 extremities and on face Test coordination: Normal finger nose testing, with no evidence of limb appendicular ataxia or abnormal involuntary movements or tremors noted.     Lab Results: Basic Metabolic Panel:  Recent Labs Lab 02/03/16 1426 02/03/16 1454 02/06/16 0558  NA 136 139 137  K 3.5 3.4* 4.7  CL 102 103 103  CO2 20*  --  20*  GLUCOSE 104* 108* 150*  BUN 15 17 19   CREATININE 0.74 0.70 0.84  CALCIUM 10.2  --  9.9    Liver Function Tests:  Recent Labs Lab 02/03/16 1426  AST 23  ALT 26  ALKPHOS 61  BILITOT 0.8  PROT 8.7*  ALBUMIN 4.6   No results for input(s): LIPASE, AMYLASE in the last 168 hours. No results for input(s): AMMONIA in the last 168 hours.  CBC:  Recent Labs Lab 02/03/16 1426 02/03/16 1454  WBC 7.9  --   NEUTROABS 4.7  --   HGB 14.4 17.3*  HCT 44.6 51.0*  MCV 80.9  --   PLT 266  --  Cardiac Enzymes: No results for input(s): CKTOTAL, CKMB, CKMBINDEX, TROPONINI in the last 168 hours.  Lipid Panel: No results for input(s): CHOL, TRIG, HDL, CHOLHDL, VLDL, LDLCALC in the last 168 hours.  CBG:  Recent Labs Lab 02/07/16 0641 02/08/16 0609 02/08/16 1113 02/08/16 1626 02/08/16 1956  GLUCAP 143* 161* 258* 160* 278*    Microbiology: No results found for this or any previous visit.  Imaging: No results found.  Assessment and plan:   Beverly Beltran is an 45 y.o. female patient withwith a new diagnosis of multiple strokes is based on abnormal brain MRI showing several demyelinating lesions in a typical topography consistent with primary demyelinating condition such as MS and presence of an acute enhancing demyelinating lesion in the left posterior  frontal lobe which likely caused her speech symptoms of possible motor aphasia at presentation. MRI of the cervical and thoracic spine did not reveal any spinal cord demyelinating lesions. Of note, MRI of the cervical spine does show a large posterior disc protrusion at C5-6 level and also midthoracic posterior disc protrusions at T7-8, T8-9 and T9-10 levels, causing at least moderate spinal canal stenosis at these levels. Recommend outpatient follow-up with neurosurgery for further evaluation of this cervical and thoracic spinal Stenosis from the Posterior Disc Protrusions.   Her speech symptoms have resolved with IV Solu-Medrol. she'll complete 5 days of steroids tonight.  Planned to d/c home tomor am.   After discharge from the hospital, she is advised to follow-up with neurologist specialized in treating MS patients and start disease modifying therapy for MS.

## 2016-02-08 NOTE — Progress Notes (Signed)
Progress Note    PROGRESS NOTE  Beverly Beltran  FBP:[id number] DOB: 05-24-1971  DOA: 02/03/2016 PCP: No primary care provider on file.   Outpatient Specialists:   None.   Brief Narrative:   Beverly Beltran is an 45 y.o. female with a PMH of hypertension, hyperlipidemia and allergic rhinitis who was admitted 02/03/16 with a 6 day history of slurred speech and left arm paresthesias. MRI of the brain was obtained and notable for a 16 mm dominant lesion in the subcortical white matter of the left middle frontal gyrus with suspicion for MS. Neurology was consulted by the EDP and evaluated the patient in the ER.   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 Brain MRI showed a left frontal lobe enhancing lesion characteristic of active demyelination. No cervical or thoracic spinal demyelinating lesions. High-dose steroids initiated by neurology.Will complete high-dose steroids 02/08/16.  Active Problems:   Cervical spinal stenosis/paresthesia of left arm MRI of the cervical spine shows severe right and moderate to severe left C5-6 neural foraminal narrowing. Steroids might improve.Will need outpatient neurosurgical follow-up.    Hypokalemia Likely from diuretics. Resolved with supplementation. Continue potassium supplement of 10 mEq daily.    Hypertension Continue lisinopril/HCTZ.    Hyperlipidemia Heart healthy diet.    Other allergic rhinitis Continue Singulair and Claritin.    Family Communication/Anticipated D/C date and plan/Code Status   DVT prophylaxis: Lovenox ordered. Code Status: Full Code.  Family Communication: Husband and children at the bedside 02/05/16. Disposition Plan: Home 02/09/16 once course of high-dose steroids completed.   Medical Consultants:    Neurology   Procedures:     Anti-Infectives:   Anti-infectives    None      Subjective:   Beverly Beltran is without complaint today. No nausea, vomiting or diarrhea although her stools have been a little bit on the loose side. Appetite is okay. Speech seems to be back to baseline. No complaints of paresthesias today.  Objective:    Filed Vitals:   02/07/16 1659 02/07/16 2122 02/08/16 0118 02/08/16 0606  BP: 131/68 125/75 126/64 115/64  Pulse: 69 62 60 70  Temp: 98.2 F (36.8 C) 98.1 F (36.7 C) 98.6 F (37 C) 98.4 F (36.9 C)  TempSrc: Oral Oral Oral Oral  Resp: 20 20 20 20   Height:      Weight:      SpO2: 100% 98% 97% 97%   No intake or output data in the 24 hours ending 02/08/16 0809 Filed Weights   02/04/16 0155  Weight: 93.078 kg (205 lb 3.2 oz)    Exam: General exam: Appears calm and comfortable.  Respiratory system: Clear to auscultation. Respiratory effort normal. Cardiovascular system: S1 & S2 heard, RRR. No JVD, murmurs, rubs, gallops or clicks. No pedal edema. Gastrointestinal system: Abdomen is nondistended, soft and nontender. No organomegaly or masses felt. Normal bowel sounds heard. Central nervous system: Alert and oriented. No focal neurological deficits. Extremities: Symmetric 5 x 5 power. No clubbing, edema, or cyanosis. Skin: No rashes, lesions or ulcers Psychiatry: Judgement and insight appear normal. Mood & affect appropriate.   Data Reviewed:   I have personally reviewed following labs and imaging studies:  Labs: Basic Metabolic Panel:  Recent Labs Lab 02/03/16 1426 02/03/16 1454 02/06/16 0558  NA 136 139 137  K 3.5 3.4* 4.7  CL 102 103 103  CO2 20*  --  20*  GLUCOSE 104* 108* 150*  BUN 15 17 19   CREATININE 0.74 0.70 0.84  CALCIUM 10.2  --  9.9   GFR Estimated Creatinine Clearance: 92.7 mL/min (by C-G formula based on Cr of 0.84). Liver Function Tests:  Recent Labs Lab 02/03/16 1426  AST 23  ALT 26  ALKPHOS 61  BILITOT 0.8  PROT 8.7*   ALBUMIN 4.6   Coagulation profile  Recent Labs Lab 02/03/16 1426  INR 1.00    CBC:  Recent Labs Lab 02/03/16 1426 02/03/16 1454  WBC 7.9  --   NEUTROABS 4.7  --   HGB 14.4 17.3*  HCT 44.6 51.0*  MCV 80.9  --   PLT 266  --    CBG:  Recent Labs Lab 02/04/16 0609 02/05/16 0631 02/06/16 0612 02/07/16 0641 02/08/16 0609  GLUCAP 106* 157* 139* 143* 161*   Microbiology No results found for this or any previous visit (from the past 240 hour(s)).  Radiology: No results found.  Medications:   . cholecalciferol  1,000 Units Oral Daily  . enoxaparin (LOVENOX) injection  40 mg Subcutaneous Q24H  . lisinopril  20 mg Oral Daily   And  . hydrochlorothiazide  12.5 mg Oral Daily  . loratadine  10 mg Oral Daily  . methylPREDNISolone (SOLU-MEDROL) injection  250 mg Intravenous Q6H  . montelukast  10 mg Oral QHS  . naproxen  250 mg Oral QHS  . pantoprazole  40 mg Oral Daily  . potassium chloride  10 mEq Oral Daily  . vitamin C  500 mg Oral Daily   Continuous Infusions:   Time spent: 15 minutes.   LOS: 5 days   [facility] Pager [phone]. If unable to reach me by pager, please call my cell phone at [phone].  *Please refer to [email or web address], password [email or web address] to get updated schedule on who will round on this patient, as hospitalists switch teams weekly. If 7PM-7AM, please contact night-coverage at [email or web address], password [email or web address] for any overnight needs.  02/08/2016, 8:09 AM

## 2016-02-09 DIAGNOSIS — G35 Multiple sclerosis: Secondary | ICD-10-CM | POA: Diagnosis present

## 2016-02-09 LAB — GLUCOSE, CAPILLARY: GLUCOSE-CAPILLARY: 152 mg/dL — AB (ref 65–99)

## 2016-02-09 MED ORDER — PANTOPRAZOLE SODIUM 40 MG PO TBEC: 40.0000 mg | DELAYED_RELEASE_TABLET | Freq: Every day | ORAL | Status: DC

## 2016-02-09 NOTE — Care Management Note (Signed)
Case Management Note  Patient Details  Name: Jameeka Marcy MRN: [id number] Date of Birth: [birth year]  Subjective/Objective:                    Action/Plan: Patient discharging home with self care. No further needs per CM.   Expected Discharge Date:                  Expected Discharge Plan:  Home/Self Care  In-House Referral:     Discharge planning Services     Post Acute Care Choice:    Choice offered to:     DME Arranged:    DME Agency:     HH Arranged:    [place] Agency:     Status of Service:  Completed, signed off  Medicare Important Message Given:    Date Medicare IM Given:    Medicare IM give by:    Date Additional Medicare IM Given:    Additional Medicare Important Message give by:     If discussed at [place] of Stay Meetings, dates discussed:    Additional Comments:  Pollie Friar, RN 02/09/2016, 11:29 AM

## 2016-02-09 NOTE — Discharge Summary (Signed)
Physician Discharge Summary  Beverly Beltran XNT:[id number] DOB: 09-24-1971 DOA: 02/03/2016  PCP: Lilian Coma, MD  Admit date: 02/03/2016 Discharge date: 02/09/2016   Recommendations for Outpatient Follow-Up:   1. The patient will F/U with [facility] for newly diagnosed MS. 2. PCP: Please refer to outpatient neurosurgery for evaluation of cervical spinal stenosis non-urgently.   Discharge Diagnosis:   Principal Problem:    Multiple sclerosis (HCC)/Acquired CNS lesion with aphasia and paresthesia of the left arm Active Problems:    Hypertension    Hyperlipidemia    Other allergic rhinitis    Spinal stenosis in cervical region    Hypokalemia  Discharge disposition:  Home.    Discharge Condition: Improved.  Diet recommendation: Low sodium, heart healthy.     History of Present Illness:   Beverly Beltran is an 45 y.o. female with a PMH of hypertension, hyperlipidemia and allergic rhinitis who was admitted 02/03/16 with a 6 day history of slurred speech and left arm paresthesias. MRI of the brain was obtained and notable for a 16 mm dominant lesion in the subcortical white matter of the left middle frontal gyrus with suspicion for MS. Neurology was consulted by the EDP and evaluated the patient in the ER.   Hospital Course by Problem:    Principal Problem:  Acquired CNS lesion consistent with newly diagnosed MS/aphasia Neurology following with recommendations to obtain further diagnostic imaging with cervical and thoracic spine MRI. Brain MRI showed a left frontal lobe enhancing lesion characteristic of active demyelination. No cervical or thoracic spinal demyelinating lesions. High-dose steroids initiated by neurology.Will complete high-dose steroids 02/08/16, and F/U with [facility].  Active Problems:  Cervical spinal stenosis/paresthesia of left arm MRI of the cervical spine shows severe right and moderate to severe left C5-6 neural foraminal narrowing. Steroids might  improve.Will need outpatient neurosurgical follow-up.   Hypokalemia Likely from diuretics. Resolved with supplementation.    Hypertension Continue lisinopril/HCTZ.   Hyperlipidemia Heart healthy diet.   Other allergic rhinitis Continue Singulair and Claritin.    Medical Consultants:    Neurology   Discharge Exam:   Filed Vitals:   02/09/16 0518 02/09/16 0941  BP: 121/67 122/71  Pulse: 54 67  Temp: 97.9 F (36.6 C) 98.8 F (37.1 C)  Resp: 20 16   Filed Vitals:   02/08/16 2142 02/09/16 0124 02/09/16 0518 02/09/16 0941  BP: 129/77 118/68 121/67 122/71  Pulse: 56 51 54 67  Temp: 98.2 F (36.8 C) 97.9 F (36.6 C) 97.9 F (36.6 C) 98.8 F (37.1 C)  TempSrc: Oral Oral Oral Oral  Resp: 20 20 20 16   Height:      Weight:      SpO2: 98% 95% 98% 99%    Gen:  NAD Cardiovascular:  RRR, No M/R/G Respiratory: Lungs CTAB Gastrointestinal: Abdomen soft, NT/ND with normal active bowel sounds. Extremities: No C/E/C   The results of significant diagnostics from this hospitalization (including imaging, microbiology, ancillary and laboratory) are listed below for reference.     Procedures and Diagnostic Studies:   Ct Head Wo Contrast  02/03/2016  CLINICAL DATA:  Slurred speech for 6 days. Gait disturbance. Dyscoordination. EXAM: CT HEAD WITHOUT CONTRAST TECHNIQUE: Contiguous axial images were obtained from the base of the skull through the vertex without intravenous contrast. COMPARISON:  None. FINDINGS: The lateral and third ventricles are normal in size and configuration. There is enlargement of the fourth ventricle. 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 There is no intracranial mass, hemorrhage,  extra-axial fluid collection, or midline shift. No gray or white compartment lesions are evident. No acute infarct evident. The bony calvarium appears intact. The mastoid air cells are clear. No intraorbital lesions are evident. 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 Lateral and third ventricles appear normal. No intracranial mass, hemorrhage, or focal gray - white compartment lesion. No acute infarct evident. Electronically Signed   By: Lowella Grip III M.D.   On: 02/03/2016 14:45   Mr Brain Wo Contrast  02/03/2016  CLINICAL DATA:  45 year old female with slurred speech for 6 days. Mild facial droop, dropping things with her left hand this week. Initial encounter. EXAM: MRI HEAD WITHOUT CONTRAST TECHNIQUE: Multiplanar, multiecho pulse sequences of the brain and surrounding structures were obtained without intravenous contrast. COMPARISON:  Noncontrast head CT 1438 hours today FINDINGS: 16 mm round area of abnormal white matter T2 and FLAIR hyperintensity associated with increased trace diffusion signal, but appears facilitated on ADC. This is in the left middle frontal gyrus (series 10, image 16) in proximity to Broca's area. Mild if any associated mass effect. This is largely occult on the earlier CT. Multiple additional smaller bilateral cerebral white matter lesions including some periventricular and subcortical areas of involvement elsewhere in both hemispheres. The temporal lobes appear spared. No definite corpus callosum involvement. None of these are restricted or conspicuous on diffusion weighted imaging. Cerebral volume is within normal limits. Mega cisterna magna variant. No restricted diffusion to suggest acute infarction. No midline shift, ventriculomegaly, extra-axial collection or acute intracranial hemorrhage. Cervicomedullary junction and pituitary are within normal limits. Major intracranial vascular flow voids are preserved with mild vertebrobasilar tortuosity. No cortical encephalomalacia or chronic cerebral blood products. Signal in the deep gray matter, brainstem, and cerebellum is normal. Visible internal auditory structures appear normal. Mastoids are  clear. Trace paranasal sinus mucosal thickening. Negative orbit soft tissues. Grossly negative visualized cervical spine. Negative scalp soft tissues. Visualized bone marrow signal is within normal limits. IMPRESSION: 1. Dominant lesion measuring 16 mm in the subcortical white matter of the left middle frontal gyrus is favored to be the symptomatic abnormality. The noncontrast appearance of that lesion, as well as the presence of multiple additional age advanced white matter signal changes, favors Multiple Sclerosis. Other inflammatory, infectious, or neoplastic considerations are all less likely. Recommend Neurology consultation. 2. No deep gray matter or posterior fossa involvement, and otherwise negative noncontrast MRI appearance of the brain. Electronically Signed   By: Genevie Ann M.D.   On: 02/03/2016 20:24   Mr Brain W Contrast  02/04/2016  CLINICAL DATA:  Dysarthria beginning 6 days ago, LEFT arm numbness. Symptoms not improving. Difficulty urinating, and unsteady gait. History of multiple sclerosis, hypertension and hypercholesterolemia. EXAM: MRI HEAD WITH CONTRAST TECHNIQUE: Multiplanar, multiecho pulse sequences of the brain and surrounding structures were obtained with intravenous contrast. COMPARISON:  MRI of the brain without contrast February 03, 2016 CONTRAST:  23m MULTIHANCE GADOBENATE DIMEGLUMINE 529 MG/ML IV SOLN FINDINGS: 15 x 12 mm peripherally enhancing LEFT frontal lobe mass corresponding to FLAIR abnormality from yesterday's MRI, with central area nonenhancing halo type appearance. No additional enhancing lesions. No abnormal extra-axial enhancement or extra-axial masses. IMPRESSION: 15 x 12 mm LEFT frontal lobe enhancing lesion with imaging characteristics of active demyelination. Electronically Signed   By: CElon AlasM.D.   On: 02/04/2016 01:26   Mr Cervical Spine Wo Contrast  02/04/2016  CLINICAL DATA:  Dysarthria beginning 6 days ago, LEFT arm  numbness. Symptoms not improving.  Difficulty urinating. Unsteady gait. History of multiple sclerosis, hypertension and hypercholesterolemia. EXAM: MRI CERVICAL SPINE WITHOUT CONTRAST TECHNIQUE: Multiplanar, multisequence MR imaging of the cervical spine was performed. No intravenous contrast was administered. COMPARISON:  MRI of the brain January 24, 2016 at 1941 hours FINDINGS: Cervical vertebral bodies intact and aligned with straightened cervical lordosis. Moderate C5-6 disc height loss, remaining disc morphology's maintained. Mild chronic discogenic endplate changes I0-9 and C5-6. No bone marrow signal abnormality to suggest acute osseous process. Cervical spinal cord is normal morphology and signal characteristics from the cervical medullary junction to level of T2-3 , the most caudal well visualized level. Included prevertebral and paraspinal soft tissues are normal. Level by level evaluation: C2-3: No disc bulge, canal stenosis nor neural foraminal narrowing. C3-4: Tiny central disc protrusion without canal stenosis. Mild RIGHT facet arthropathy and mild RIGHT neural foraminal narrowing. C4-5: Tiny central disc protrusion. Mild facet arthropathy without canal stenosis or neural foraminal narrowing. C5-6: 3 mm broad-based RIGHT central disc protrusion results in mild canal stenosis. Severe RIGHT, moderate to severe LEFT neural foraminal narrowing. C6-7 and C7-T1: No disc bulge, canal stenosis nor neural foraminal narrowing. IMPRESSION: No MR findings of cervical spine acute nor chronic demyelination. Degenerative change of the cervical spine results in mild canal stenosis at C5-6. Severe RIGHT and moderate to severe LEFT C5-6 neural foraminal narrowing. Electronically Signed   By: Elon Alas M.D.   On: 02/04/2016 00:43   Mr Thoracic Spine Wo Contrast  02/04/2016  CLINICAL DATA:  Dysarthria for 6 days, not improving. LEFT arm numbness. History of hypertension, hypercholesterolemia and multiple sclerosis. EXAM: MRI THORACIC SPINE WITHOUT  CONTRAST TECHNIQUE: Multiplanar, multisequence MR imaging of the thoracic spine was performed. No intravenous contrast was administered. COMPARISON:  MRI of the cervical spine February 03, 2016. FINDINGS: Thoracic vertebral bodies intact and aligned and maintenance of thoracic kyphosis. Intervertebral disc morphology is generally preserved with slightly decreased T2 signal within the mid to lower thoracic disc compatible with mild desiccation and mild subacute to chronic discogenic endplate changes. No STIR signal abnormality to suggest acute osseous process. Multilevel ventral thoracic spinal cord deformity as described below due to disc extrusions without MR findings of cord edema. No myelomalacia. No syrinx. Included prevertebral and paraspinal soft tissues are normal. At T7-8 is a 5 x 8 mm (AP by transverse) RIGHT central disc extrusion without migration deforming the RIGHT ventral spinal cord and, resulting in mild canal stenosis. At T8-9 is a 7 x 6 mm (AP by transverse) central disc extrusion with 10 mm of superior and to lesser extent inferior migration deforming the ventral spinal cord at resulting in mild canal stenosis. At T9-10 is a 4 x 6 mm (AP by transverse) central disc extrusion with 9 mm of superior contiguous migration mildly deforming the ventral spinal cord and resulting in mild canal stenosis. No neural foraminal narrowing at any level. IMPRESSION: No MR findings of acute nor chronic demyelination in the thoracic spine. 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 No neural foraminal narrowing. Electronically Signed   By: Elon Alas M.D.   On: 02/04/2016 01:13       Labs:   Basic Metabolic Panel:  Recent Labs Lab 02/03/16 1426 02/03/16 1454 02/06/16 0558  NA 136 139 137  K 3.5 3.4* 4.7  CL 102 103 103  CO2 20*  --  20*  GLUCOSE 104* 108* 150*  BUN 15  17 19  CREATININE 0.74 0.70 0.84  CALCIUM 10.2  --  9.9    GFR Estimated Creatinine Clearance: 92.7 mL/min (by C-G formula based on Cr of 0.84). Liver Function Tests:  Recent Labs Lab 02/03/16 1426  AST 23  ALT 26  ALKPHOS 61  BILITOT 0.8  PROT 8.7*  ALBUMIN 4.6   Coagulation profile  Recent Labs Lab 02/03/16 1426  INR 1.00    CBC:  Recent Labs Lab 02/03/16 1426 02/03/16 1454  WBC 7.9  --   NEUTROABS 4.7  --   HGB 14.4 17.3*  HCT 44.6 51.0*  MCV 80.9  --   PLT 266  --    CBG:  Recent Labs Lab 02/08/16 0609 02/08/16 1113 02/08/16 1626 02/08/16 1956 02/09/16 0602  GLUCAP 161* 258* 160* 278* 152*     Discharge Instructions:       Discharge Instructions    Call MD for:  extreme fatigue    Complete by:  As directed      Call MD for:  persistant dizziness or light-headedness    Complete by:  As directed      Call MD for:  persistant nausea and vomiting    Complete by:  As directed      Call MD for:    Complete by:  As directed   Any new symptoms such as numbness/tingling, visual changes.     Diet general    Complete by:  As directed      Increase activity slowly    Complete by:  As directed             Medication List    TAKE these medications        cetirizine 10 MG tablet  Commonly known as:  ZYRTEC  Take 10 mg by mouth daily.     cholecalciferol 1000 units tablet  Commonly known as:  VITAMIN D  Take 1,000 Units by mouth daily.     lisinopril-hydrochlorothiazide 20-12.5 MG tablet  Commonly known as:  PRINZIDE,ZESTORETIC  Take 1 tablet by mouth daily.     MAGNESIUM PO  Take by mouth.     montelukast 10 MG tablet  Commonly known as:  SINGULAIR  Take 10 mg by mouth at bedtime.     naproxen sodium 220 MG tablet  Commonly known as:  ANAPROX  Take 220 mg by mouth at bedtime.     pantoprazole 40 MG tablet  Commonly known as:  PROTONIX  Take 1 tablet (40 mg total) by mouth daily.     PROBIOTIC ACIDOPHILUS PO  Take 1 capsule by mouth daily.     VITAMIN B 12 PO  Take 1 tablet by  mouth daily.     vitamin C 500 MG tablet  Commonly known as:  ASCORBIC ACID  Take 500 mg by mouth daily.       Follow-up Information    Follow up with [facility].   Specialty:  Neurology   Contact information:   [address] [place] [phone]       Time coordinating discharge: 35 minutes.  Signed:  RAMA,CHRISTINA  Pager [phone] [facility] 02/09/2016, 11:41 AM

## 2016-02-09 NOTE — Progress Notes (Signed)
Discharge orders received, Pt for discharge home today.  IV d/c'd. D/c instructions and RX given with verbalized understanding. Family at bedside to assist patient with discharge. Staff bought pt downstairs via wheelchair. 02/09/16 1427

## 2016-02-09 NOTE — Progress Notes (Signed)
Inpatient Diabetes Program Recommendations  AACE/ADA: New Consensus Statement on Inpatient Glycemic Control (2015)  Target Ranges:  Prepandial:   less than 140 mg/dL      Peak postprandial:   less than 180 mg/dL (1-2 hours)      Critically ill patients:  140 - 180 mg/dL   Review of Glycemic Control  Diabetes history: No hx Outpatient Diabetes medications: None Current orders for Inpatient glycemic control: None On solumedrol 250 mg Q6H.  Results for Beverly Beltran, Beverly Beltran (MRN [id number]) as of 02/09/2016 10:23  Ref. Range 02/08/2016 06:09 02/08/2016 11:13 02/08/2016 16:26 02/08/2016 19:56 02/09/2016 06:02  Glucose-Capillary Latest Ref Range: 65-99 mg/dL 161 (H) 258 (H) 160 (H) 278 (H) 152 (H)  Post-prandial blood sugars elevated with steroids. May benefit from addition of correction insulin.  Inpatient Diabetes Program Recommendations:    Check HgbA1C to assess glycemic control prior to hospitalization Consider addition of Novolog sensitive tidwc.  Will follow. Thank you. Lorenda Peck, RD, LDN, CDE Inpatient Diabetes Coordinator [phone]

## 2016-02-09 NOTE — Discharge Instructions (Signed)
Multiple Sclerosis Multiple sclerosis (MS) is a disease of the central nervous system. It leads to the loss of the insulating covering of the nerves (myelin sheath) of your brain. When this happens, brain signals do not get sent properly or may not get sent at all. The age of onset of MS varies.  CAUSES The cause of MS is unknown. However, it is more common in the [place] than in the [place]. RISK FACTORS There is a higher number of women with MS than men. MS is not an illness that is passed down to you from your family members (inherited). However, your risk of MS is higher if you have a relative with MS. SIGNS AND SYMPTOMS  The symptoms of MS occur in episodes or attacks. These attacks may last weeks to months. There may be long periods of almost no symptoms between attacks. The symptoms of MS vary. This is because of the many different ways it affects the central nervous system. The main symptoms of MS include:  Vision problems and eye pain.  Numbness.  Weakness.  Inability to move your arms, hands, feet, or legs (paralysis).  Balance problems.  Tremors. DIAGNOSIS  Your health care provider can diagnose MS with the help of imaging exams and lab tests. These may include specialized X-ray exams and spinal fluid tests. The best imaging exam to confirm a diagnosis of MS is an MRI. TREATMENT  There is no known cure for MS, but there are medicines that can decrease the number and frequency of attacks. Steroids are often used for short-term relief. Physical and occupational therapy may also help. There are also many new alternative or complementary treatments available to help control the symptoms of MS. Ask your health care provider if any of these other options are right for you. HOME CARE INSTRUCTIONS   Take medicines as directed by your health care provider.  Exercise as directed by your health care provider. SEEK MEDICAL CARE IF: You begin to feel  depressed. SEEK IMMEDIATE MEDICAL CARE IF:  You develop paralysis.  You have problems with bladder, bowel, or sexual function.  You develop mental changes, such as forgetfulness or mood swings.  You have a period of uncontrolled movements (seizure).   This information is not intended to replace advice given to you by your health care provider. Make sure you discuss any questions you have with your health care provider.   Document Released: 10/01/2000 Document Revised: 10/09/2013 Document Reviewed: 06/11/2013 Elsevier Interactive Patient Education [employer].

## 2016-02-23 ENCOUNTER — Encounter: Payer: Self-pay | Admitting: Neurology

## 2016-02-23 ENCOUNTER — Ambulatory Visit (INDEPENDENT_AMBULATORY_CARE_PROVIDER_SITE_OTHER): Payer: 59 | Admitting: Neurology

## 2016-02-23 VITALS — BP 124/62 | HR 68 | Resp 16 | Ht 63.0 in | Wt 204.0 lb

## 2016-02-23 DIAGNOSIS — R7989 Other specified abnormal findings of blood chemistry: Secondary | ICD-10-CM | POA: Diagnosis not present

## 2016-02-23 DIAGNOSIS — R5383 Other fatigue: Secondary | ICD-10-CM | POA: Insufficient documentation

## 2016-02-23 DIAGNOSIS — R4701 Aphasia: Secondary | ICD-10-CM

## 2016-02-23 DIAGNOSIS — G35 Multiple sclerosis: Secondary | ICD-10-CM | POA: Diagnosis not present

## 2016-02-23 DIAGNOSIS — G4700 Insomnia, unspecified: Secondary | ICD-10-CM | POA: Insufficient documentation

## 2016-02-23 DIAGNOSIS — R202 Paresthesia of skin: Secondary | ICD-10-CM

## 2016-02-23 MED ORDER — CYCLOBENZAPRINE HCL 5 MG PO TABS: 5.0000 mg | ORAL_TABLET | Freq: Every day | ORAL | Status: DC

## 2016-02-23 NOTE — Progress Notes (Addendum)
p  [facility]  PATIENT: Beverly Beltran DOB: 04/24/1971  REFERRING DOCTOR OR PCP:  Maurice Small SOURCE: Patient, ED and hospital notes in [place], labs and imaging reports in Epic, personal review of MRI images on PACS.  _________________________________   HISTORICAL  CHIEF COMPLAINT:  Chief Complaint  Patient presents with  . Abnormal MRI Brain    Sts. in mid April, she began having slurred speech, trouble with word finding.  She was seen at [facility] ED, where she sts. MRI showed MS lesions. No LP done. She was admitted and given 5 days of IV SM.   Speech disturbances have resolved.  She is here today to confirm dx. and discuss tx. options/fim  . Speech Disturbance    HISTORY OF PRESENT ILLNESS:  I had the pleasure seeing your patient, Beverly Beltran, at [facility] for neurologic consultation regarding her slurred speech, arm numbness and recent diagnosis of multiple sclerosis.   Around 01/28/2016, she had the onset of slurred speech and also felt more fatigue. Additionally, there was a dysesthetic numbness going into the left arm towards the thumb. She notes that she had just moved into a new house and had done quite a bit of activity over the preceding couple of weeks when symptoms did not improve after couple days, she went to the emergency room. There, she was admitted. The initial CT scan was normal but MRI of the brain was consistent with MS. MRI of the spine did not show any MS lesions but did show significant degenerative changes at C5-C6 as well as T7-T8, T8-T9 and T9-T10..   She received 5 days of steroids and was discharged home.  By the third or fourth day of IV steroids, symptoms were much better.   She was discharged after 5 days of steroids.  Gait/strength/sensation: She denies any difficulty with gait. She could climb a ladder if she wanted to. She denies any significant difficulty with strength or sensation at this time.  She still gets some  dysesthetic sensation down the left arm towards the thumb at times. This can occur with sitting and not necessarily with changes in position.  Bladder/bowel: She notes some urinary frequency and urgency but is not having incontinence. There is no hesitancy. She has nocturia x 2.  She does note some bowel constipation. Both of these issues have occurred for several years at least.  Vision: She notes that several years ago when she was in [place] she had visual blurring and was noted to have an abnormality and was having a procedure that sounds like OCT every 6 months to measure the nerve fibers.  Fatigue/sleep: She does note quite a bit of fatigue that is worse as the day goes on and worse with heat. She has some excessive daytime sleepiness and would doze off if she is resting and sometimes will watching TV and other activities. She notes insomnia with more difficulty falling than staying asleep.    Mood/cognition: She is on sertraline 50 mg for anxiety more than for depression.   This has been beneficial to her. She currently denies any cognitive issues but noted difficulty coming out with the right words in the midst of her exacerbation last month. This has improved.  I personally reviewed the CT scan from 02/03/2016, MRI from 02/03/2016 of the brain and spine and the contrasted MRI of the brain from 02/04/2016 on PACS. MRI of the brain shows an enhancing large focus in the left frontal lobe, is also hyperintense on  diffusion-weighted images. There are several other T2/FLAIR hyperintense foci in the periventricular, deep and juxtacortical white matter. The MRI of the cervical spine shows degenerative changes at C5-C6 causing severe right and moderate left foraminal narrowing that could lead to right C6 nerve root compression there are milder degenerative changes at C3-C4 and C4-C5 with less potential for nerve root impingement.  MRI of the thoracic spine showed central disc extrusions at T7-T8, T8-T9  and T9-T10 deforming the spinal cord was in mild spinal stenosis of these levels. The spinal cord had normal signal.  Notes from her emergency room and hospital visits from 02/03/2016 through 02/09/2016 were reviewed. She presented with several days of slurred speech and left arm numbness. He received IV Solu-Medrol and was discharged for 02/09/2016.   She had elevated glucose while on steroids.  REVIEW OF SYSTEMS: Constitutional: No fevers, chills, sweats, or change in appetite   she notes fatigue, insomnia and daytime hypersomnia Eyes: No visual changes, double vision, eye pain Ear, nose and throat: No hearing loss, ear pain, nasal congestion, sore throat Cardiovascular: No chest pain, palpitations Respiratory: No shortness of breath at rest or with exertion.   No wheezes.   She snores GastrointestinaI: No nausea, vomiting, diarrhea, abdominal pain, fecal incontinence.  Some constipation Genitourinary: No dysuria, urinary retention.  She has frequency and nocturia. Musculoskeletal: No neck pain, back pain Integumentary: No rash, pruritus, skin lesions Neurological: as above Psychiatric: No depression at this time.  No anxiety now (had in past) Endocrine: No palpitations, diaphoresis, change in appetite, change in weigh or increased thirst Hematologic/Lymphatic: No anemia, purpura, petechiae. Allergic/Immunologic: No itchy/runny eyes, nasal congestion, recent allergic reactions, rashes  ALLERGIES: No Known Allergies  HOME MEDICATIONS:  Current outpatient prescriptions:  .  cetirizine (ZYRTEC) 10 MG tablet, Take 10 mg by mouth daily., Disp: , Rfl:  .  cholecalciferol (VITAMIN D) 1000 units tablet, Take 1,000 Units by mouth daily., Disp: , Rfl:  .  Cyanocobalamin (VITAMIN B 12 PO), Take 1 tablet by mouth daily. , Disp: , Rfl:  .  Lactobacillus (PROBIOTIC ACIDOPHILUS PO), Take 1 capsule by mouth daily., Disp: , Rfl:  .  lisinopril-hydrochlorothiazide (PRINZIDE,ZESTORETIC) 20-12.5 MG  tablet, Take 1 tablet by mouth daily., Disp: , Rfl:  .  MAGNESIUM PO, Take by mouth., Disp: , Rfl:  .  montelukast (SINGULAIR) 10 MG tablet, Take 10 mg by mouth at bedtime., Disp: , Rfl:  .  naproxen sodium (ANAPROX) 220 MG tablet, Take 220 mg by mouth at bedtime., Disp: , Rfl:  .  pantoprazole (PROTONIX) 40 MG tablet, Take 1 tablet (40 mg total) by mouth daily., Disp: 14 tablet, Rfl: 0 .  pravastatin (PRAVACHOL) 20 MG tablet, Take 20 mg by mouth daily., Disp: , Rfl: 3 .  sertraline (ZOLOFT) 50 MG tablet, TAKE 1/2 TAB FOR 1 WEEK, THEN 1 TAB ONCE A DAY ORALLY 90 DAYS, Disp: , Rfl: 3 .  vitamin C (ASCORBIC ACID) 500 MG tablet, Take 500 mg by mouth daily., Disp: , Rfl:   PAST MEDICAL HISTORY: Past Medical History  Diagnosis Date  . Hypertension   . Hypercholesteremia   . Vision abnormalities     PAST SURGICAL HISTORY: No past surgical history on file.  FAMILY HISTORY: Family History  Problem Relation Age of Onset  . Diabetes Mellitus II Mother   . COPD Father     SOCIAL HISTORY:  Social History   Social History  . Marital Status: Married    Spouse Name: N/A  . Number  of Children: N/A  . Years of Education: N/A   Occupational History  . Not on file.   Social History Main Topics  . Smoking status: Never Smoker   . Smokeless tobacco: Not on file  . Alcohol Use: No  . Drug Use: No  . Sexual Activity: Not on file   Other Topics Concern  . Not on file   Social History Narrative     PHYSICAL EXAM  Filed Vitals:   02/23/16 0930  BP: 124/62  Pulse: 68  Resp: 16  Height: 5' 3"  (1.6 m)  Weight: 204 lb (92.534 kg)    Body mass index is 36.15 kg/(m^2).   General: The patient is well-developed and well-nourished and in no acute distress  Eyes:  Funduscopic exam shows normal optic discs and retinal vessels.  Neck: The neck is supple, no carotid bruits are noted.  The neck is nontender.  Cardiovascular: The heart has a regular rate and rhythm with a normal S1  and S2. There were no murmurs, gallops or rubs. Lungs are clear to auscultation.  Skin: Extremities are without significant edema.  Musculoskeletal:  Back is nontender  Neurologic Exam  Mental status: The patient is alert and oriented x 3 at the time of the examination. The patient has apparent normal recent and remote memory, with an apparently normal attention span and concentration ability.   Speech is normal.  Cranial nerves: Extraocular movements are full. Pupils are equal, round, and reactive to light and accomodation.  Visual fields are full.  Facial symmetry is present. There is good facial sensation to soft touch bilaterally.Facial strength is normal.  Trapezius and sternocleidomastoid strength is normal. No dysarthria is noted.  The tongue is midline, and the patient has symmetric elevation of the soft palate. No obvious hearing deficits are noted.  Motor:  Muscle bulk is normal.   Tone is normal. Strength is  5 / 5 in all 4 extremities.   Sensory: Sensory testing is intact to pinprick, soft touch and vibration sensation in all 4 extremities.  Coordination: Cerebellar testing reveals good finger-nose-finger and heel-to-shin bilaterally.  Gait and station: Station is normal.   Gait is normal. Tandem gait is slightly wide. Romberg is negative.   Reflexes: Deep tendon reflexes are symmetric and normal bilaterally.   Plantar responses are flexor.    DIAGNOSTIC DATA (LABS, IMAGING, TESTING) - I reviewed patient records, labs, notes, testing and imaging myself where available.  Lab Results  Component Value Date   WBC 7.9 02/03/2016   HGB 17.3* 02/03/2016   HCT 51.0* 02/03/2016   MCV 80.9 02/03/2016   PLT 266 02/03/2016      Component Value Date/Time   NA 137 02/06/2016 0558   K 4.7 02/06/2016 0558   CL 103 02/06/2016 0558   CO2 20* 02/06/2016 0558   GLUCOSE 150* 02/06/2016 0558   BUN 19 02/06/2016 0558   CREATININE 0.84 02/06/2016 0558   CALCIUM 9.9 02/06/2016 0558    PROT 8.7* 02/03/2016 1426   ALBUMIN 4.6 02/03/2016 1426   AST 23 02/03/2016 1426   ALT 26 02/03/2016 1426   ALKPHOS 61 02/03/2016 1426   BILITOT 0.8 02/03/2016 1426   GFRNONAA >60 02/06/2016 0558   GFRAA >60 02/06/2016 0558       ASSESSMENT AND PLAN  Multiple sclerosis ([place]) - Plan: Stratify JCV Antibody Test ([facility]), Pan-ANCA, Angiotensin converting enzyme, VITAMIN D 25 Hydroxy (Vit-D Deficiency, Fractures)  Aphasia  Paresthesia of left arm  Low serum vitamin D -  Plan: VITAMIN D 25 Hydroxy (Vit-D Deficiency, Fractures)  Other fatigue - Plan: Pan-ANCA, Angiotensin converting enzyme, VITAMIN D 25 Hydroxy (Vit-D Deficiency, Fractures)  Insomnia   In summary, Mrs. Mcglade is a 45 year old woman who presented with speech difficulties and was found to have an MRI consistent with multiple sclerosis. The existence of an enhancing lesion with the presence of the other lesions makes MS by far the most likely diagnosis. I will check a pan-ANCA and ACE level to rule out PAN and neurosarcoidosis, though I feel the possibility of either of these is slim. We had a long discussion about MS symptoms, prognosis and treatments.    After going over several of the therapeutic options, she is most interested in [place] and would also consider a drug trial we are participating in comparing a drug similar to Tecfidera to [place].   She had had low vitamin D in the past and that has been linked to a higher incidence of MS and worsening relapses. Therefore, we will also check a vitamin D level and supplement as needed.  I believe the left arm symptoms are more likely to be due to her degenerative changes at C5-C6 than to MS.  As she is just experiencing sensory symptoms but no weakness, this could probably be treated medically and I would consider adding gabapentin or similar medicine if symptoms do not improve. She also has mid to lower thoracic ideations at 3 levels but does not appear to be having symptoms  from this.  I will add nighttime cyclobenzaprine for her insomnia. It excessive daytime sleepiness, we will consider obtaining a sleep study.  She will return to see me in 2 months or sooner if there are new or worsening neurologic symptoms.  Thank you for asking me to see Mrs. Siharath or neurologic consultation. Please let me know if I can be of further assistance with her or other patients in the future.  Terril Chestnut A. Felecia Shelling, MD, PhD 03/26/6294, 2:84 AM Certified in Neurology, Clinical Neurophysiology, Sleep Medicine, Pain Medicine and Neuroimaging  [facility] [address], [place], [place] [address] [phone]   ADDENDUM:    The angiotensin converting enzyme and PAN-ANCA both returned normal.   She meets McDonald's criteria for MS as the MRI shows dissemination in both space and time.  Additionally, the mild visual changes she experienced in the past could be related.

## 2016-02-24 LAB — PAN-ANCA
ANCA Proteinase 3: <3.5 U/mL (ref 0.0–3.5)
Atypical pANCA: <1:20 {titer}
Myeloperoxidase Ab: <9 U/mL (ref 0.0–9.0)
P-ANCA: <1:20 {titer}

## 2016-02-24 LAB — VITAMIN D 25 HYDROXY (VIT D DEFICIENCY, FRACTURES): VIT D 25 HYDROXY: 32.9 ng/mL (ref 30.0–100.0)

## 2016-02-24 LAB — ANGIOTENSIN CONVERTING ENZYME

## 2016-02-26 ENCOUNTER — Telehealth: Payer: Self-pay | Admitting: Neurology

## 2016-02-26 NOTE — Telephone Encounter (Signed)
PAN-ANCA and ACE were normal  We discussed her possible participation in the [employer] drug study comparing the new drug with Tecfidera. She is potentially interested. I will forward her name to [facility]

## 2016-02-27 ENCOUNTER — Encounter: Payer: Self-pay | Admitting: *Deleted

## 2016-04-05 ENCOUNTER — Ambulatory Visit (INDEPENDENT_AMBULATORY_CARE_PROVIDER_SITE_OTHER): Payer: Self-pay | Admitting: Neurology

## 2016-04-05 DIAGNOSIS — G35 Multiple sclerosis: Secondary | ICD-10-CM

## 2016-04-05 NOTE — Progress Notes (Signed)
Beverly Beltran is here today for screening for the ALKS-302 MS study.   EDSS 1.5

## 2016-04-14 ENCOUNTER — Other Ambulatory Visit: Payer: 59

## 2016-04-22 ENCOUNTER — Ambulatory Visit
Admission: RE | Admit: 2016-04-22 | Discharge: 2016-04-22 | Disposition: A | Discharge status: Home / Self Care | Payer: No Typology Code available for payment source | Source: Ambulatory Visit | Attending: Neurology | Admitting: Neurology

## 2016-04-22 DIAGNOSIS — G35 Multiple sclerosis: Secondary | ICD-10-CM

## 2016-04-22 MED ORDER — GADOBENATE DIMEGLUMINE 529 MG/ML IV SOLN
19.0000 mL | Freq: Once | INTRAVENOUS | Status: AC | PRN
  Administered 2016-04-22: 19 mL via INTRAVENOUS

## 2016-04-27 ENCOUNTER — Ambulatory Visit: Payer: 59 | Admitting: Neurology

## 2016-05-04 ENCOUNTER — Ambulatory Visit: Payer: Self-pay | Admitting: Neurology

## 2016-05-28 ENCOUNTER — Other Ambulatory Visit: Payer: Self-pay | Admitting: Neurology

## 2016-05-28 DIAGNOSIS — G35 Multiple sclerosis: Secondary | ICD-10-CM

## 2016-06-09 ENCOUNTER — Ambulatory Visit: Payer: Self-pay | Admitting: Neurology

## 2016-06-09 ENCOUNTER — Ambulatory Visit
Admission: RE | Admit: 2016-06-09 | Discharge: 2016-06-09 | Disposition: A | Discharge status: Home / Self Care | Payer: No Typology Code available for payment source | Source: Ambulatory Visit | Attending: Neurology | Admitting: Neurology

## 2016-06-09 DIAGNOSIS — G35 Multiple sclerosis: Secondary | ICD-10-CM

## 2016-06-09 MED ORDER — GADOBENATE DIMEGLUMINE 529 MG/ML IV SOLN
19.0000 mL | Freq: Once | INTRAVENOUS | Status: AC | PRN
  Administered 2016-06-09: 19 mL via INTRAVENOUS

## 2016-06-18 ENCOUNTER — Ambulatory Visit: Payer: Self-pay | Admitting: Neurology

## 2016-06-23 ENCOUNTER — Ambulatory Visit (INDEPENDENT_AMBULATORY_CARE_PROVIDER_SITE_OTHER): Payer: Self-pay | Admitting: Neurology

## 2016-06-23 DIAGNOSIS — Z0289 Encounter for other administrative examinations: Secondary | ICD-10-CM

## 2016-07-07 ENCOUNTER — Ambulatory Visit: Payer: Self-pay | Admitting: Neurology

## 2016-08-05 ENCOUNTER — Other Ambulatory Visit: Payer: Self-pay | Admitting: Neurology

## 2016-08-05 ENCOUNTER — Ambulatory Visit (INDEPENDENT_AMBULATORY_CARE_PROVIDER_SITE_OTHER): Payer: Self-pay | Admitting: Neurology

## 2016-08-05 DIAGNOSIS — G35 Multiple sclerosis: Secondary | ICD-10-CM

## 2016-08-05 MED ORDER — CYCLOBENZAPRINE HCL 5 MG PO TABS: 5.0000 mg | ORAL_TABLET | Freq: Every day | ORAL | 11 refills | Status: DC

## 2016-08-05 NOTE — Progress Notes (Signed)
She is here today for a research visit.

## 2016-09-01 ENCOUNTER — Ambulatory Visit: Payer: Self-pay | Admitting: Neurology

## 2016-09-01 ENCOUNTER — Other Ambulatory Visit: Payer: Self-pay | Admitting: Neurology

## 2016-09-01 MED ORDER — MELOXICAM 7.5 MG PO TABS: 7.5000 mg | ORAL_TABLET | Freq: Every day | ORAL | 5 refills | Status: DC | PRN

## 2016-09-01 NOTE — Progress Notes (Signed)
melox

## 2016-09-28 ENCOUNTER — Ambulatory Visit: Payer: Self-pay | Admitting: Neurology

## 2016-10-22 DIAGNOSIS — K760 Fatty (change of) liver, not elsewhere classified: Secondary | ICD-10-CM | POA: Diagnosis not present

## 2016-10-22 DIAGNOSIS — R310 Gross hematuria: Secondary | ICD-10-CM | POA: Diagnosis not present

## 2016-10-28 ENCOUNTER — Ambulatory Visit: Payer: Self-pay | Admitting: Neurology

## 2016-11-11 DIAGNOSIS — R310 Gross hematuria: Secondary | ICD-10-CM | POA: Diagnosis not present

## 2016-11-24 ENCOUNTER — Ambulatory Visit: Payer: Self-pay | Admitting: Neurology

## 2016-12-08 ENCOUNTER — Ambulatory Visit: Payer: 59 | Admitting: Obstetrics and Gynecology

## 2016-12-08 ENCOUNTER — Encounter: Payer: Self-pay | Admitting: Obstetrics and Gynecology

## 2016-12-08 VITALS — BP 102/60 | HR 72 | Resp 16 | Ht 63.25 in | Wt 213.0 lb

## 2016-12-08 DIAGNOSIS — Z01419 Encounter for gynecological examination (general) (routine) without abnormal findings: Secondary | ICD-10-CM

## 2016-12-08 DIAGNOSIS — Z8742 Personal history of other diseases of the female genital tract: Secondary | ICD-10-CM | POA: Diagnosis not present

## 2016-12-08 DIAGNOSIS — R87610 Atypical squamous cells of undetermined significance on cytologic smear of cervix (ASC-US): Secondary | ICD-10-CM | POA: Diagnosis not present

## 2016-12-08 DIAGNOSIS — Z124 Encounter for screening for malignant neoplasm of cervix: Secondary | ICD-10-CM

## 2016-12-08 DIAGNOSIS — Z3009 Encounter for other general counseling and advice on contraception: Secondary | ICD-10-CM | POA: Diagnosis not present

## 2016-12-08 NOTE — Patient Instructions (Signed)
EXERCISE AND DIET:  We recommended that you start or continue a regular exercise program for good health. Regular exercise means any activity that makes your heart beat faster and makes you sweat.  We recommend exercising at least 30 minutes per day at least 3 days a week, preferably 4 or 5.  We also recommend a diet low in fat and sugar.  Inactivity, poor dietary choices and obesity can cause diabetes, heart attack, stroke, and kidney damage, among others.    ALCOHOL AND SMOKING:  Women should limit their alcohol intake to no more than 7 drinks/beers/glasses of wine (combined, not each!) per week. Moderation of alcohol intake to this level decreases your risk of breast cancer and liver damage. And of course, no recreational drugs are part of a healthy lifestyle.  And absolutely no smoking or even second hand smoke. Most people know smoking can cause heart and lung diseases, but did you know it also contributes to weakening of your bones? Aging of your skin?  Yellowing of your teeth and nails?  CALCIUM AND VITAMIN D:  Adequate intake of calcium and Vitamin D are recommended.  The recommendations for exact amounts of these supplements seem to change often, but generally speaking 600 mg of calcium (either carbonate or citrate) and 800 units of Vitamin D per day seems prudent. Certain women may benefit from higher intake of Vitamin D.  If you are among these women, your doctor will have told you during your visit.    PAP SMEARS:  Pap smears, to check for cervical cancer or precancers,  have traditionally been done yearly, although recent scientific advances have shown that most women can have pap smears less often.  However, every woman still should have a physical exam from her gynecologist every year. It will include a breast check, inspection of the vulva and vagina to check for abnormal growths or skin changes, a visual exam of the cervix, and then an exam to evaluate the size and shape of the uterus and  ovaries.  And after 45 years of age, a rectal exam is indicated to check for rectal cancers. We will also provide age appropriate advice regarding health maintenance, like when you should have certain vaccines, screening for sexually transmitted diseases, bone density testing, colonoscopy, mammograms, etc.   MAMMOGRAMS:  All women over 40 years old should have a yearly mammogram. Many facilities now offer a "3D" mammogram, which may cost around $50 extra out of pocket. If possible,  we recommend you accept the option to have the 3D mammogram performed.  It both reduces the number of women who will be called back for extra views which then turn out to be normal, and it is better than the routine mammogram at detecting truly abnormal areas.    COLONOSCOPY:  Colonoscopy to screen for colon cancer is recommended for all women at age 50.  We know, you hate the idea of the prep.  We agree, BUT, having colon cancer and not knowing it is worse!!  Colon cancer so often starts as a polyp that can be seen and removed at colonscopy, which can quite literally save your life!  And if your first colonoscopy is normal and you have no family history of colon cancer, most women don't have to have it again for 10 years.  Once every ten years, you can do something that may end up saving your life, right?  We will be happy to help you get it scheduled when you are ready.    Be sure to check your insurance coverage so you understand how much it will cost.  It may be covered as a preventative service at no cost, but you should check your particular policy.     Kegel Exercises The goal of Kegel exercises is to isolate and exercise your pelvic floor muscles. These muscles act as a hammock that supports the rectum, vagina, small intestine, and uterus. As the muscles weaken, the hammock sags and these organs are displaced from their normal positions. Kegel exercises can strengthen your pelvic floor muscles and help you to improve  bladder and bowel control, improve sexual response, and help reduce many problems and some discomfort during pregnancy. Kegel exercises can be done anywhere and at any time. HOW TO PERFORM KEGEL EXERCISES 1. Locate your pelvic floor muscles. To do this, squeeze (contract) the muscles that you use when you try to stop the flow of urine. You will feel a tightness in the vaginal area (women) and a tight lift in the rectal area (men and women). 2. When you begin, contract your pelvic muscles tight for 2-5 seconds, then relax them for 2-5 seconds. This is one set. Do 4-5 sets with a short pause in between. 3. Contract your pelvic muscles for 8-10 seconds, then relax them for 8-10 seconds. Do 4-5 sets. If you cannot contract your pelvic muscles for 8-10 seconds, try 5-7 seconds and work your way up to 8-10 seconds. Your goal is 4-5 sets of 10 contractions each day. Keep your stomach, buttocks, and legs relaxed during the exercises. Perform sets of both short and long contractions. Vary your positions. Perform these contractions 3-4 times per day. Perform sets while you are:   Lying in bed in the morning.  Standing at lunch.  Sitting in the late afternoon.  Lying in bed at night. You should do 40-50 contractions per day. Do not perform more Kegel exercises per day than recommended. Overexercising can cause muscle fatigue. Continue these exercises for for at least 15-20 weeks or as directed by your caregiver. This information is not intended to replace advice given to you by your health care provider. Make sure you discuss any questions you have with your health care provider. Document Released: 09/20/2012 Document Revised: 10/25/2014 Document Reviewed: 08/24/2015 Elsevier Interactive Patient Education  2017 [employer].

## 2016-12-08 NOTE — Progress Notes (Addendum)
46 y.o. [id number] MarriedCaucasianF here as a new patient for annual exam. Patient would also like to discuss birth control. Per patient possible cyst on left ovary. She was seen by urologist in the fall, had a CT showed a cyst in her left ovary.    Period Cycle (Days):  (about 28) Period Duration (Days): 4-5 days Period Pattern: Regular Menstrual Flow: Moderate, Light Menstrual Control: Tampon, Panty liner Menstrual Control Change Freq (Hours): 4 hours Dysmenorrhea: (!) Mild Dysmenorrhea Symptoms:  (occasional back pain)  Sexually active, no pain with intercourse. She is using a diaphragm, needs more effective contraception for a study she is in. The medication seems to be helping.   Patient's last menstrual period was 11/27/2016.          Sexually active: Yes.    The current method of family planning is diaphragm.    Exercising: Yes.    walk the dog Smoker:  no  Health Maintenance: Pap:  2016 per patient - normal History of abnormal Pap:  No per patient MMG:  2016 per patient normal; done in [place] Colonoscopy:  never BMD:   never TDaP:  2016 per patient Gardasil: never   reports that she has never smoked. She has never used smokeless tobacco. She reports that she does not drink alcohol or use drugs. Homemaker, not on disability. Kids are 13 and 17. Moved her in Summer of 2016.   Past Medical History:  Diagnosis Date  . Anxiety   . Depression   . Genital warts   . Hypercholesteremia   . Hypertension   . Multiple sclerosis ([place]) 01/2016  . Vision abnormalities     Past Surgical History:  Procedure Laterality Date  . DILATION AND CURETTAGE OF UTERUS  1994    Current Outpatient Prescriptions  Medication Sig Dispense Refill  . BIOTIN 5000 PO Take 1 capsule by mouth daily.    . cetirizine (ZYRTEC) 10 MG tablet Take 10 mg by mouth daily.    . cholecalciferol (VITAMIN D) 1000 units tablet Take 1,000 Units by mouth daily.    . Cyanocobalamin (VITAMIN B 12 PO) Take 1  tablet by mouth daily.     . cyclobenzaprine (FLEXERIL) 5 MG tablet Take 1 tablet (5 mg total) by mouth at bedtime. 30 tablet 11  . fluticasone (FLONASE ALLERGY RELIEF) 50 MCG/ACT nasal spray Place 1 spray into both nostrils daily.    . Lactobacillus (PROBIOTIC ACIDOPHILUS PO) Take 1 capsule by mouth daily.    Marland Kitchen lisinopril-hydrochlorothiazide (PRINZIDE,ZESTORETIC) 20-12.5 MG tablet Take 1 tablet by mouth daily.    Marland Kitchen MAGNESIUM PO Take by mouth.    . meloxicam (MOBIC) 7.5 MG tablet Take 1 tablet (7.5 mg total) by mouth daily as needed for pain. 30 tablet 5  . montelukast (SINGULAIR) 10 MG tablet Take 10 mg by mouth at bedtime.    . naproxen sodium (ANAPROX) 220 MG tablet Take 220 mg by mouth at bedtime.    . Nutritional Supplements (MULTIPLE SCLEROSIS SUPPORT PO) Take 2 capsules by mouth 2 times daily at 12 noon and 4 pm.    . pravastatin (PRAVACHOL) 20 MG tablet Take 20 mg by mouth daily.  3  . sertraline (ZOLOFT) 50 MG tablet TAKE 1/2 TAB FOR 1 WEEK, THEN 1 TAB ONCE A DAY ORALLY 90 DAYS  3  . vitamin C (ASCORBIC ACID) 500 MG tablet Take 500 mg by mouth daily.     No current facility-administered medications for this visit.     Family History  Problem Relation Age of Onset  . Diabetes Mellitus II Mother   . COPD Father     Review of Systems  Constitutional: Negative.   HENT: Negative.   Eyes: Negative.   Respiratory: Negative.   Cardiovascular: Negative.   Gastrointestinal: Negative.   Endocrine: Negative.   Genitourinary: Negative.   Musculoskeletal: Negative.   Skin: Negative.   Allergic/Immunologic: Negative.   Neurological: Negative.   Hematological: Negative.   Psychiatric/Behavioral: Negative.   Bladder works fairly well, sometimes needs to double void. Occasional GSI, tolerable. She has fatigue, a few memory issues from her MS. Was diagnosed with MS when she had some slurring of her speech (temporary).   Exam:   BP 102/60 (BP Location: Right Arm, Patient Position:  Sitting, Cuff Size: Normal)   Pulse 72   Resp 16   Ht 5' 3.25" (1.607 m)   Wt 213 lb (96.6 kg)   LMP 11/27/2016   BMI 37.43 kg/m   Weight change: @WEIGHTCHANGE @ Height:   Height: 5' 3.25" (160.7 cm)  Ht Readings from Last 3 Encounters:  12/08/16 5' 3.25" (1.607 m)  02/23/16 5' 3"  (1.6 m)  02/04/16 5' 3"  (1.6 m)    General appearance: alert, cooperative and appears stated age Head: Normocephalic, without obvious abnormality, atraumatic Neck: no adenopathy, supple, symmetrical, trachea midline and thyroid normal to inspection and palpation Lungs: clear to auscultation bilaterally Cardiovascular: regular rate and rhythm Breasts: normal appearance, no masses or tenderness Heart: regular rate and rhythm Abdomen: soft, non-tender; bowel sounds normal; no masses,  no organomegaly Extremities: extremities normal, atraumatic, no cyanosis or edema Skin: Skin color, texture, turgor normal. No rashes or lesions Lymph nodes: Cervical, supraclavicular, and axillary nodes normal. No abnormal inguinal nodes palpated Neurologic: Grossly normal   Pelvic: External genitalia:  no lesions              Urethra:  normal appearing urethra with no masses, tenderness or lesions              Bartholins and Skenes: normal                 Vagina: normal appearing vagina with normal color and discharge, no lesions              Cervix: no lesions               Bimanual Exam:  Uterus:  normal size, contour, position, consistency, mobility, non-tender, anteverted.              Adnexa: no mass, fullness, tenderness               Rectovaginal: Confirms               Anus:  normal sphincter tone, no lesions  Chaperone was present for exam.  A:  Well Woman with normal exam  H/O a left ovarian cyst in the fall (incidental finding on CT)  Contraception    P:   Pap with hpv  Interested in the IUD, will return for a mirena on her cycle, reviewed risks  Will get a copy of her CT, ? Need for ultrasound  Labs  and immunizations with primary  Mammogram  Discussed breast self exam  Discussed calcium and vit D intake   12/12/16 Addendum: CT report from 10/22/16 reviewed She had a 3.7 cm left adnexal cyst, suspicious for a hemorrhagic cyst. Recommendation for a f/u ultrasound in 2-3 months.  Will contact the patient and set up an ultrasound.

## 2016-12-12 ENCOUNTER — Telehealth: Payer: Self-pay | Admitting: Obstetrics and Gynecology

## 2016-12-12 DIAGNOSIS — N83202 Unspecified ovarian cyst, left side: Secondary | ICD-10-CM

## 2016-12-12 NOTE — Telephone Encounter (Signed)
Please call the patient and let her know that I reviewed her CT report. She had a 3.7 cm cyst in her left adnexa on 10/22/16. A f/u ultrasound was recommended in 2-3 months. Please set her up for an ultrasound in 3/18 in our office. Thanks.

## 2016-12-13 ENCOUNTER — Telehealth: Payer: Self-pay | Admitting: Obstetrics and Gynecology

## 2016-12-13 NOTE — Telephone Encounter (Signed)
Left message to call Kaitlyn at [phone]. 

## 2016-12-13 NOTE — Telephone Encounter (Signed)
Spoke with patient. Results given. Patient verbalizes understanding. PUS scheduled for 01/04/2017 at 12:0 pm with 1 pm consult with Dr.Jertson. Patient is agreeable to date and time. Order placed for precert.  Routing to provider for final review. Patient agreeable to disposition. Will close encounter.

## 2016-12-13 NOTE — Telephone Encounter (Signed)
Please see telephone encounter dated 12/12/2016.  Will close encounter.

## 2016-12-13 NOTE — Telephone Encounter (Signed)
Returning a call to [place].

## 2016-12-13 NOTE — Addendum Note (Signed)
Addended by: Gwendlyn Deutscher on: 12/13/2016 12:43 PM   Modules accepted: Orders

## 2016-12-14 LAB — IPS PAP TEST WITH HPV

## 2016-12-20 ENCOUNTER — Telehealth: Payer: Self-pay

## 2016-12-20 NOTE — Telephone Encounter (Signed)
Spoke with patient. Advised of results as seen below from [place]. Patient verbalizes understanding. Patient will cal with the first day of her next menses to schedule colposcopy.  Routing to provider for final review. Patient agreeable to disposition. Will close encounter.

## 2016-12-20 NOTE — Telephone Encounter (Signed)
-----   Message from Salvadore Dom, MD sent at 12/15/2016  5:11 PM EST ----- ASCUS, +HPV, please set her up for a colposcopy. I would recommend doing the colposcopy prior to the IUD incase she needs any treatment

## 2016-12-20 NOTE — Telephone Encounter (Signed)
Left message to call Jeronda Don at [phone]. 

## 2016-12-30 ENCOUNTER — Telehealth: Payer: Self-pay | Admitting: Obstetrics and Gynecology

## 2016-12-30 NOTE — Telephone Encounter (Signed)
Spoke with patient. Patient started her menses on 12/29/2016 and is ready to schedule colposcopy. Appointment scheduled for 01/05/2017 at 1 pm with Dr.Jertson. Patient is agreeable to date and time.   Instructions given. Motrin 800 mg po x , one hour before appointment with food. Make sure to eat a meal before appointment and drink plenty of fluids. Patient verbalized understanding and will call to reschedule if will be on menses or has any concerns regarding pregnancy. Patient agreeable and verbalized understanding of all instructions.   Routing to provider for final review. Patient agreeable to disposition. Will close encounter.

## 2016-12-30 NOTE — Telephone Encounter (Signed)
Patient calling to schedule colposcopy procedure.

## 2017-01-03 ENCOUNTER — Other Ambulatory Visit: Payer: Self-pay

## 2017-01-03 DIAGNOSIS — R87610 Atypical squamous cells of undetermined significance on cytologic smear of cervix (ASC-US): Secondary | ICD-10-CM

## 2017-01-03 DIAGNOSIS — R87810 Cervical high risk human papillomavirus (HPV) DNA test positive: Principal | ICD-10-CM

## 2017-01-04 ENCOUNTER — Ambulatory Visit (INDEPENDENT_AMBULATORY_CARE_PROVIDER_SITE_OTHER): Payer: 59

## 2017-01-04 ENCOUNTER — Encounter: Payer: Self-pay | Admitting: Obstetrics and Gynecology

## 2017-01-04 ENCOUNTER — Ambulatory Visit (INDEPENDENT_AMBULATORY_CARE_PROVIDER_SITE_OTHER): Payer: 59 | Admitting: Obstetrics and Gynecology

## 2017-01-04 VITALS — BP 110/60 | HR 68 | Resp 16 | Wt 214.0 lb

## 2017-01-04 DIAGNOSIS — N83202 Unspecified ovarian cyst, left side: Secondary | ICD-10-CM | POA: Diagnosis not present

## 2017-01-04 NOTE — Progress Notes (Signed)
GYNECOLOGY  VISIT   HPI: 46 y.o.   Married  Caucasian  female   [phone] with Patient's last menstrual period was 12/29/2016.   here for follow up left ovarian cyst. The cyst was noted as an incidental finding on CT.   GYNECOLOGIC HISTORY: Patient's last menstrual period was 12/29/2016. Contraception: diaphragm  Menopausal hormone therapy: none         OB History    Gravida Para Term Preterm AB Living   3 2 2  0 1 2   SAB TAB Ectopic Multiple Live Births   0 0 0 0 0         Patient Active Problem List   Diagnosis Date Noted  . Low serum vitamin D 02/23/2016  . Other fatigue 02/23/2016  . Insomnia 02/23/2016  . Multiple sclerosis ([place])   . Spinal stenosis in cervical region 02/04/2016  . Hypokalemia 02/04/2016  . Other allergic rhinitis   . Aphasia 02/03/2016  . Paresthesia of left arm 02/03/2016  . Hypertension 02/03/2016  . Hyperlipidemia 02/03/2016  . Acquired CNS lesion 02/03/2016    Past Medical History:  Diagnosis Date  . Anxiety   . Depression   . Genital warts   . Hypercholesteremia   . Hypertension   . Multiple sclerosis ([place]) 01/2016  . Vision abnormalities     Past Surgical History:  Procedure Laterality Date  . DILATION AND CURETTAGE OF UTERUS  1994    Current Outpatient Prescriptions  Medication Sig Dispense Refill  . BIOTIN 5000 PO Take 1 capsule by mouth daily.    . cetirizine (ZYRTEC) 10 MG tablet Take 10 mg by mouth daily.    . cholecalciferol (VITAMIN D) 1000 units tablet Take 1,000 Units by mouth daily.    . Cyanocobalamin (VITAMIN B 12 PO) Take 1 tablet by mouth daily.     . cyclobenzaprine (FLEXERIL) 5 MG tablet Take 1 tablet (5 mg total) by mouth at bedtime. 30 tablet 11  . fluticasone (FLONASE ALLERGY RELIEF) 50 MCG/ACT nasal spray Place 1 spray into both nostrils daily.    . Lactobacillus (PROBIOTIC ACIDOPHILUS PO) Take 1 capsule by mouth daily.    Marland Kitchen lisinopril-hydrochlorothiazide (PRINZIDE,ZESTORETIC) 20-12.5 MG tablet Take 1  tablet by mouth daily.    Marland Kitchen MAGNESIUM PO Take by mouth.    . meloxicam (MOBIC) 7.5 MG tablet Take 1 tablet (7.5 mg total) by mouth daily as needed for pain. 30 tablet 5  . montelukast (SINGULAIR) 10 MG tablet Take 10 mg by mouth at bedtime.    . naproxen sodium (ANAPROX) 220 MG tablet Take 220 mg by mouth at bedtime.    . Nutritional Supplements (MULTIPLE SCLEROSIS SUPPORT PO) Take 2 capsules by mouth 2 times daily at 12 noon and 4 pm.    . pravastatin (PRAVACHOL) 20 MG tablet Take 20 mg by mouth daily.  3  . sertraline (ZOLOFT) 50 MG tablet TAKE 1/2 TAB FOR 1 WEEK, THEN 1 TAB ONCE A DAY ORALLY 90 DAYS  3  . vitamin C (ASCORBIC ACID) 500 MG tablet Take 500 mg by mouth daily.     No current facility-administered medications for this visit.      ALLERGIES: Patient has no known allergies.  Family History  Problem Relation Age of Onset  . Diabetes Mellitus II Mother   . COPD Father     Social History   Social History  . Marital status: Married    Spouse name: N/A  . Number of children: N/A  .  Years of education: N/A   Occupational History  . Not on file.   Social History Main Topics  . Smoking status: Never Smoker  . Smokeless tobacco: Never Used  . Alcohol use No  . Drug use: No  . Sexual activity: Yes    Birth control/ protection: Diaphragm   Other Topics Concern  . Not on file   Social History Narrative  . No narrative on file    Review of Systems  Constitutional: Negative.   HENT: Negative.   Eyes: Negative.   Respiratory: Negative.   Cardiovascular: Negative.   Gastrointestinal: Negative.   Genitourinary: Negative.   Musculoskeletal: Negative.   Skin: Negative.   Neurological: Negative.   Endo/Heme/Allergies: Negative.   Psychiatric/Behavioral: Negative.     PHYSICAL EXAMINATION:    BP 110/60 (BP Location: Right Arm, Patient Position: Sitting, Cuff Size: Normal)   Pulse 68   Resp 16   Wt 214 lb (97.1 kg)   LMP 12/29/2016   BMI 37.61 kg/m       General appearance: alert, cooperative and appears stated age  Ultrasound images reviewed with the patient, normal pelvic ultrasound  ASSESSMENT H/O left ovarian cyst, resolved    PLAN Routine f/u   An After Visit Summary was printed and given to the patient.

## 2017-01-05 ENCOUNTER — Ambulatory Visit (INDEPENDENT_AMBULATORY_CARE_PROVIDER_SITE_OTHER): Payer: 59 | Admitting: Obstetrics and Gynecology

## 2017-01-05 ENCOUNTER — Encounter: Payer: Self-pay | Admitting: Obstetrics and Gynecology

## 2017-01-05 VITALS — BP 112/60 | HR 60 | Resp 14 | Wt 214.0 lb

## 2017-01-05 DIAGNOSIS — R87810 Cervical high risk human papillomavirus (HPV) DNA test positive: Secondary | ICD-10-CM | POA: Diagnosis not present

## 2017-01-05 DIAGNOSIS — D069 Carcinoma in situ of cervix, unspecified: Secondary | ICD-10-CM | POA: Diagnosis not present

## 2017-01-05 DIAGNOSIS — N870 Mild cervical dysplasia: Secondary | ICD-10-CM | POA: Diagnosis not present

## 2017-01-05 DIAGNOSIS — R87610 Atypical squamous cells of undetermined significance on cytologic smear of cervix (ASC-US): Secondary | ICD-10-CM | POA: Diagnosis not present

## 2017-01-05 DIAGNOSIS — Z01812 Encounter for preprocedural laboratory examination: Secondary | ICD-10-CM | POA: Diagnosis not present

## 2017-01-05 LAB — POCT URINE PREGNANCY: Preg Test, Ur: NEGATIVE

## 2017-01-05 NOTE — Patient Instructions (Signed)
Colposcopy Post-procedure Instructions . Cramping is common.  You may take Ibuprofen, Aleve, or Tylenol for the cramping.  This should resolve within the next two to three days.   . You may have bright red spotting or blackish discharge for several days after your procedure.  The discharge occurs because of a topical solution used to stop bleeding at the biopsy site(s).  You should wear a mini pad for the next few days. . Refrain from putting anything in the vagina until the bleeding and/or discharge stops (usually less than a week). . You need to call the office if you have any pelvic pain, fever, heavy bleeding, or foul smelling vaginal discharge. . Shower or bathe as normal . You will be notified within one week of your biopsy results or we will discuss your results at your follow-up appointment if needed. . Avoid intercourse for 5 days

## 2017-01-05 NOTE — Progress Notes (Signed)
GYNECOLOGY  VISIT   HPI: 46 y.o.   Married  Caucasian  female   [phone] with Patient's last menstrual period was 12/29/2016.   here for a colposcopy for an ASCUS/+HPV pap. She is planning on returning for a mirena IUD  GYNECOLOGIC HISTORY: Patient's last menstrual period was 12/29/2016. Contraception:none  Menopausal hormone therapy: none         OB History    Gravida Para Term Preterm AB Living   3 2 2  0 1 2   SAB TAB Ectopic Multiple Live Births   0 0 0 0 0         Patient Active Problem List   Diagnosis Date Noted  . Low serum vitamin D 02/23/2016  . Other fatigue 02/23/2016  . Insomnia 02/23/2016  . Multiple sclerosis ([place])   . Spinal stenosis in cervical region 02/04/2016  . Hypokalemia 02/04/2016  . Other allergic rhinitis   . Aphasia 02/03/2016  . Paresthesia of left arm 02/03/2016  . Hypertension 02/03/2016  . Hyperlipidemia 02/03/2016  . Acquired CNS lesion 02/03/2016    Past Medical History:  Diagnosis Date  . Anxiety   . Depression   . Genital warts   . Hypercholesteremia   . Hypertension   . Multiple sclerosis ([place]) 01/2016  . Vision abnormalities     Past Surgical History:  Procedure Laterality Date  . DILATION AND CURETTAGE OF UTERUS  1994    Current Outpatient Prescriptions  Medication Sig Dispense Refill  . BIOTIN 5000 PO Take 1 capsule by mouth daily.    . cetirizine (ZYRTEC) 10 MG tablet Take 10 mg by mouth daily.    . cholecalciferol (VITAMIN D) 1000 units tablet Take 1,000 Units by mouth daily.    . Cyanocobalamin (VITAMIN B 12 PO) Take 1 tablet by mouth daily.     . cyclobenzaprine (FLEXERIL) 5 MG tablet Take 1 tablet (5 mg total) by mouth at bedtime. 30 tablet 11  . fluticasone (FLONASE ALLERGY RELIEF) 50 MCG/ACT nasal spray Place 1 spray into both nostrils daily.    . Lactobacillus (PROBIOTIC ACIDOPHILUS PO) Take 1 capsule by mouth daily.    Marland Kitchen lisinopril-hydrochlorothiazide (PRINZIDE,ZESTORETIC) 20-12.5 MG tablet Take 1 tablet by  mouth daily.    Marland Kitchen MAGNESIUM PO Take by mouth.    . meloxicam (MOBIC) 7.5 MG tablet Take 1 tablet (7.5 mg total) by mouth daily as needed for pain. 30 tablet 5  . montelukast (SINGULAIR) 10 MG tablet Take 10 mg by mouth at bedtime.    . naproxen sodium (ANAPROX) 220 MG tablet Take 220 mg by mouth at bedtime.    . Nutritional Supplements (MULTIPLE SCLEROSIS SUPPORT PO) Take 2 capsules by mouth 2 times daily at 12 noon and 4 pm.    . pravastatin (PRAVACHOL) 20 MG tablet Take 20 mg by mouth daily.  3  . sertraline (ZOLOFT) 50 MG tablet TAKE 1/2 TAB FOR 1 WEEK, THEN 1 TAB ONCE A DAY ORALLY 90 DAYS  3  . vitamin C (ASCORBIC ACID) 500 MG tablet Take 500 mg by mouth daily.     No current facility-administered medications for this visit.      ALLERGIES: Patient has no known allergies.  Family History  Problem Relation Age of Onset  . Diabetes Mellitus II Mother   . COPD Father     Social History   Social History  . Marital status: Married    Spouse name: N/A  . Number of children: N/A  . Years of education:  N/A   Occupational History  . Not on file.   Social History Main Topics  . Smoking status: Never Smoker  . Smokeless tobacco: Never Used  . Alcohol use No  . Drug use: No  . Sexual activity: Yes    Birth control/ protection: Diaphragm   Other Topics Concern  . Not on file   Social History Narrative  . No narrative on file    Review of Systems  Constitutional: Negative.   HENT: Negative.   Eyes: Negative.   Respiratory: Negative.   Cardiovascular: Negative.   Gastrointestinal: Negative.   Genitourinary: Negative.   Musculoskeletal: Negative.   Skin: Negative.   Neurological: Negative.   Endo/Heme/Allergies: Negative.   Psychiatric/Behavioral: Negative.     PHYSICAL EXAMINATION:    BP 112/60 (BP Location: Right Arm, Patient Position: Sitting, Cuff Size: Normal)   Pulse 60   Resp 14   Wt 214 lb (97.1 kg)   LMP 12/29/2016   BMI 37.61 kg/m     General  appearance: alert, cooperative and appears stated age   Pelvic: External genitalia:  no lesions              Urethra:  normal appearing urethra with no masses, tenderness or lesions              Bartholins and Skenes: normal                 Vagina: normal appearing vagina with normal color and discharge, no lesions              Cervix: no gross lesions  Colposcopy: unsatisfactory, acetowhite changes are seen going up into the cervix. Biopsy of acetowhite changes right at the external os a 3 o'clock and more distally at 4 o'clock were taken. ECC was done. Negative lugols examination of the upper vagina.  Bleeding from the biopsy sites was treated with silver nitrate and monsels  Chaperone was present for exam.  ASSESSMENT ASCUS/+HPV pap    PLAN Colposcopy, biopsies and ECC done   An After Visit Summary was printed and given to the patient.

## 2017-01-10 ENCOUNTER — Telehealth: Payer: Self-pay

## 2017-01-10 DIAGNOSIS — D069 Carcinoma in situ of cervix, unspecified: Secondary | ICD-10-CM

## 2017-01-10 NOTE — Telephone Encounter (Signed)
Spoke with patient. Advised patient of results as seen below from [place]. Patient verbalizes understanding. Patient's LMP was 12/29/2016. Patient reports she has not been sexually active since LMP. LEEP scheduled for tomorrow 01/11/2017 at 9 am with Dr.Jertson. Patient is agreeable to date and time. Instructions given. Motrin 800 mg po x , one hour before appointment with food. Make sure to eat a meal before appointment and drink plenty of fluids. Patient verbalized understanding and will call to reschedule if will be on menses. Aware it is recommended that she take it easy for 24 hours following LEEP. Patient may need a driver following procedure and if she has someone available to drive her after it is encouraged. Patient agreeable and verbalized understanding of all instructions.   Dr.Jertson, okay to keep patient's appointment as scheduled?

## 2017-01-10 NOTE — Telephone Encounter (Signed)
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 Please set this up. I wouldn't recommend putting in the IUD until after the leep.

## 2017-01-10 NOTE — Telephone Encounter (Signed)
Yes, that's fine. Thanks!

## 2017-01-11 ENCOUNTER — Encounter: Payer: Self-pay | Admitting: Obstetrics and Gynecology

## 2017-01-11 ENCOUNTER — Ambulatory Visit (INDEPENDENT_AMBULATORY_CARE_PROVIDER_SITE_OTHER): Payer: 59 | Admitting: Obstetrics and Gynecology

## 2017-01-11 VITALS — BP 122/60 | HR 64 | Resp 14 | Wt 218.0 lb

## 2017-01-11 DIAGNOSIS — D069 Carcinoma in situ of cervix, unspecified: Secondary | ICD-10-CM | POA: Diagnosis not present

## 2017-01-11 DIAGNOSIS — R87613 High grade squamous intraepithelial lesion on cytologic smear of cervix (HGSIL): Secondary | ICD-10-CM | POA: Diagnosis not present

## 2017-01-11 DIAGNOSIS — Z01812 Encounter for preprocedural laboratory examination: Secondary | ICD-10-CM

## 2017-01-11 LAB — POCT URINE PREGNANCY: Preg Test, Ur: NEGATIVE

## 2017-01-11 NOTE — Progress Notes (Signed)
GYNECOLOGY  VISIT   HPI: 46 y.o.   Married  Caucasian  female   [phone] with Patient's last menstrual period was 12/29/2016.   here for LEEP    GYNECOLOGIC HISTORY: Patient's last menstrual period was 12/29/2016. Contraception:none  Menopausal hormone therapy: none         OB History    Gravida Para Term Preterm AB Living   3 2 2  0 1 2   SAB TAB Ectopic Multiple Live Births   0 0 0 0 0         Patient Active Problem List   Diagnosis Date Noted  . Low serum vitamin D 02/23/2016  . Other fatigue 02/23/2016  . Insomnia 02/23/2016  . Multiple sclerosis ([place])   . Spinal stenosis in cervical region 02/04/2016  . Hypokalemia 02/04/2016  . Other allergic rhinitis   . Aphasia 02/03/2016  . Paresthesia of left arm 02/03/2016  . Hypertension 02/03/2016  . Hyperlipidemia 02/03/2016  . Acquired CNS lesion 02/03/2016    Past Medical History:  Diagnosis Date  . Anxiety   . Depression   . Genital warts   . Hypercholesteremia   . Hypertension   . Multiple sclerosis ([place]) 01/2016  . Vision abnormalities     Past Surgical History:  Procedure Laterality Date  . DILATION AND CURETTAGE OF UTERUS  1994    Current Outpatient Prescriptions  Medication Sig Dispense Refill  . BIOTIN 5000 PO Take 1 capsule by mouth daily.    . cetirizine (ZYRTEC) 10 MG tablet Take 10 mg by mouth daily.    . cholecalciferol (VITAMIN D) 1000 units tablet Take 1,000 Units by mouth daily.    . Cyanocobalamin (VITAMIN B 12 PO) Take 1 tablet by mouth daily.     . cyclobenzaprine (FLEXERIL) 5 MG tablet Take 1 tablet (5 mg total) by mouth at bedtime. 30 tablet 11  . fluticasone (FLONASE ALLERGY RELIEF) 50 MCG/ACT nasal spray Place 1 spray into both nostrils daily.    . Lactobacillus (PROBIOTIC ACIDOPHILUS PO) Take 1 capsule by mouth daily.    Marland Kitchen lisinopril-hydrochlorothiazide (PRINZIDE,ZESTORETIC) 20-12.5 MG tablet Take 1 tablet by mouth daily.    Marland Kitchen MAGNESIUM PO Take by mouth.    . meloxicam (MOBIC) 7.5 MG  tablet Take 1 tablet (7.5 mg total) by mouth daily as needed for pain. 30 tablet 5  . montelukast (SINGULAIR) 10 MG tablet Take 10 mg by mouth at bedtime.    . naproxen sodium (ANAPROX) 220 MG tablet Take 220 mg by mouth at bedtime.    . Nutritional Supplements (MULTIPLE SCLEROSIS SUPPORT PO) Take 2 capsules by mouth 2 times daily at 12 noon and 4 pm.    . pravastatin (PRAVACHOL) 20 MG tablet Take 20 mg by mouth daily.  3  . sertraline (ZOLOFT) 50 MG tablet TAKE 1/2 TAB FOR 1 WEEK, THEN 1 TAB ONCE A DAY ORALLY 90 DAYS  3  . vitamin C (ASCORBIC ACID) 500 MG tablet Take 500 mg by mouth daily.     No current facility-administered medications for this visit.      ALLERGIES: Patient has no known allergies.  Family History  Problem Relation Age of Onset  . Diabetes Mellitus II Mother   . COPD Father     Social History   Social History  . Marital status: Married    Spouse name: N/A  . Number of children: N/A  . Years of education: N/A   Occupational History  . Not on file.  Social History Main Topics  . Smoking status: Never Smoker  . Smokeless tobacco: Never Used  . Alcohol use No  . Drug use: No  . Sexual activity: Yes    Birth control/ protection: Diaphragm   Other Topics Concern  . Not on file   Social History Narrative  . No narrative on file    Review of Systems  Constitutional: Negative.   HENT: Negative.   Eyes: Negative.   Respiratory: Negative.   Cardiovascular: Negative.   Gastrointestinal: Negative.   Genitourinary: Negative.   Musculoskeletal: Negative.   Skin: Negative.   Neurological: Negative.   Endo/Heme/Allergies: Negative.   Psychiatric/Behavioral: Negative.     PHYSICAL EXAMINATION:    LMP 12/29/2016     General appearance: alert, cooperative and appears stated age  Pelvic: External genitalia:  no lesions              Urethra:  normal appearing urethra with no masses, tenderness or lesions              Bartholins and Skenes: normal                  Vagina: normal appearing vagina with normal color and discharge, no lesions              Cervix: no gross lesions  Procedure: The patient was counseled as to the risks of the procedure, including: infection, bleeding and cervical stenosis. A consent form was signed.  Under colposcopic guidance, Lugols solution was placed on the cervix and a paracervical block was injected using 1% lidocaine with epinephrine. Under colposcopic guidance, the 2 x 1 cm loop was used to remove a portion of the ectocervix taking care to get the entire transformation zone (2 passes were needed).  A second 1 x 1 cm loop was used to remove a portion of the endocervix. The settings were 55 cut, 13 coag with a blend of 1.  An ECC was performed. The cautery ball was then used to cauterize the base of the biopsy site and monsels were placed. The patient tolerated the procedure well.   Chaperone was present for exam.  ASSESSMENT Loop cone cervical biopsy    PLAN F/U in one month After her f/u visit will set up Mirena IUD insertion (within a few day), may need cytotec   An After Visit Summary was printed and given to the patient.

## 2017-01-11 NOTE — Patient Instructions (Signed)
LEEP Post-procedure Instructions . Cramping is common.  You may take Ibuprofen, Aleve, or Tylenol for the cramping.  This should resolve within the next two to three days.   . You may have bright red spotting or blackish discharge for several days after your procedure.  The discharge occurs because of a topical solution used to stop bleeding at the biopsy site(s).  The dark discharge will lighten and then turn clear before completely resolving.  You will need to wear a mini pad during this time. . . Refrain from putting anything in the vagina until the bleeding and/or discharge COMPLETLEY stops (usually two to three weeks). . You need to call the office if you have any pelvic pain, fever, heavy bleeding, foul smelling vaginal discharge, or if you are concerned. . Shower or bathe as normal . You will be notified within one week of your biopsy results or we will discuss your results at your follow-up appointment if needed. . You will need to return for surveillance Pap smear(s) as advised by your physician. Avoid intercourse until your f/u appointment

## 2017-01-16 DIAGNOSIS — C801 Malignant (primary) neoplasm, unspecified: Secondary | ICD-10-CM

## 2017-01-16 HISTORY — DX: Malignant (primary) neoplasm, unspecified: C80.1

## 2017-01-18 ENCOUNTER — Encounter: Payer: Self-pay | Admitting: Gynecologic Oncology

## 2017-01-18 ENCOUNTER — Telehealth: Payer: Self-pay

## 2017-01-18 DIAGNOSIS — R87613 High grade squamous intraepithelial lesion on cytologic smear of cervix (HGSIL): Secondary | ICD-10-CM

## 2017-01-18 NOTE — Telephone Encounter (Signed)
Spoke with Seth Bake with GYN oncology. Schedule patient for first available appointment with first available provider on 01/31/2017 at 10:15 am with 9:45 am arrival to see Dr.Rossi. Patient has been notified of appointment date, time, and location. Patient is agreeable and verbalizes understanding. Will contact the office with any further questions.  [facility] at [address], [place] [address]  Main: [phone]   Routing to provider for final review. Patient agreeable to disposition. Will close encounter.

## 2017-01-18 NOTE — Telephone Encounter (Signed)
-----   Message from Salvadore Dom, MD sent at 01/17/2017  5:26 PM EDT ----- I spoke with the patient and reviewed the pathology report. I will send her for a consultation with GYN oncology for recommendations with management. She understands.  Please set her up to see GYN oncology for a small focus on leep suspicious for invasive cervical cancer

## 2017-01-31 ENCOUNTER — Encounter: Payer: Self-pay | Admitting: Gynecologic Oncology

## 2017-01-31 ENCOUNTER — Ambulatory Visit: Payer: 59 | Attending: Gynecologic Oncology | Admitting: Gynecologic Oncology

## 2017-01-31 VITALS — BP 118/80 | HR 70 | Temp 98.3°F | Resp 18 | Ht 63.0 in | Wt 215.4 lb

## 2017-01-31 DIAGNOSIS — F419 Anxiety disorder, unspecified: Secondary | ICD-10-CM | POA: Insufficient documentation

## 2017-01-31 DIAGNOSIS — I10 Essential (primary) hypertension: Secondary | ICD-10-CM | POA: Diagnosis not present

## 2017-01-31 DIAGNOSIS — F329 Major depressive disorder, single episode, unspecified: Secondary | ICD-10-CM | POA: Diagnosis not present

## 2017-01-31 DIAGNOSIS — G35 Multiple sclerosis: Secondary | ICD-10-CM | POA: Diagnosis not present

## 2017-01-31 DIAGNOSIS — C539 Malignant neoplasm of cervix uteri, unspecified: Secondary | ICD-10-CM | POA: Insufficient documentation

## 2017-01-31 DIAGNOSIS — D069 Carcinoma in situ of cervix, unspecified: Secondary | ICD-10-CM | POA: Diagnosis not present

## 2017-01-31 DIAGNOSIS — E7800 Pure hypercholesterolemia, unspecified: Secondary | ICD-10-CM | POA: Insufficient documentation

## 2017-01-31 DIAGNOSIS — R87810 Cervical high risk human papillomavirus (HPV) DNA test positive: Secondary | ICD-10-CM | POA: Insufficient documentation

## 2017-01-31 NOTE — Patient Instructions (Signed)
Please let our office know once your have decided upon which procedure you are interested in having (hysterectomy or cone biopsy) at [phone].

## 2017-01-31 NOTE — Progress Notes (Signed)
Consult Note: Gyn-Onc  Consult was requested by Dr. Talbert Nan for the evaluation of Runell Kovich 46 y.o. female  CC:  Chief Complaint  Patient presents with  . CIN-lll    early microinvasive disease of cervix    Assessment/Plan:  Ms. Valera Vallas  is a 46 y.o.  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  I explained to Bich that her cervical cancer would be classified as stage IA1 or stage 0 (CINIII). However, there is an increased risk for endocervical recurrence given the postive margins for CIN III, and close margin for possible microinvasive disease. Additionally, there is the potential for occult invasive carcinoma beyond the endocervical margin.   I discussed with Kealani that there are 3 options for intervention. 1/ close follow-up with repeat pap and endocervical assessment at 6 monthly intervals x 2 years. If these are normal, she can return to triage and schedule of paps and HPV testing in accordance with ASCCP guidelines. 2/ repeat cervical excisional procedure (I would recommend cold-knife conization to ensure better endocervical sampling). I feel that she has adequate cervical stroma and length to tolerate this procedure. I discussed that there is less recovery time involved with this procedure than more invasive procedures.  3/ hysterectomy with salpingectomy. I discussed that this would defnitively resect any endocervical disease that was present right now, but is not a treatment for high risk HPV, and therefore, 1/3 to 1/2 of patients who have hysterectomy for dysplasia or malignancy develop subsequent vaginal dysplasia in the future. I discussed that she would therefore continue to need close surveillance after hysterectomy for persistence or recurrence of dysplasia. I discussed that hysterectomy is associated with more surgical risks and longer recovery than repeat cervical excisional procedure. I discussed  that it would result in permanent sterility (of note, the patient requires birth control due to her diagnosis of MS).  The patient is electing for either option 2 (cone bx) or 3 (hysterectomy). She will think things over with her husband and let us know when she decides.   HPI: Shanik Brookshire is a 46 year old P2 who is seen in consultation at the request of Dr Talbert Nan for CIN III/IA1 microinvasive cervical cancer.  The patient received a pap smear in February, 2018 which was ASCUS, but positive for high risk HPV. She had not been tested for HPV prior to this year, and this was her first abnormal result. In work up for the abnormal pap she underwent colposcopic biopsies which confirmed CIN III on 01/05/17. She then received a LEEP procedure on 01/11/17 with a separate 12 o'clock and 6 o'clock LEEP specimen.  The endocervical curette was benign, however, the LEEP from 12 o'clock showed CIN III with a small focus "suspicious for invasive carcinoma. The high grade dysplastic process is seen at or immediately adjacent to the endocervical margin of the resection. In this background there is a small focus (1-78m) highly suspicious for superficially invasive carcinoma as seen on multiple levels.  This is also seen at the endocervical margin of resection." CIN I was present at the ectocervical margin.  [place] 01/04/17 showed a uterus measuring 7.8x4.9x4.1cm. The ovaries were normal.   She has been diagnosed with multiple sclerosis in the past year.She has no residual deficits from her initial flare, other than fatigue. She has had no prior abdominal surgeries and has had 2 prior SVD's. She has completed childbearing and was contemplating a Mirena IUD to provide  birth control after being diagnosed with MS.  Current Meds:  Outpatient Encounter Prescriptions as of 01/31/2017  Medication Sig  . BIOTIN 5000 PO Take 1 capsule by mouth daily.  . cetirizine (ZYRTEC) 10 MG tablet Take 10 mg by mouth daily.  .  cholecalciferol (VITAMIN D) 1000 units tablet Take 1,000 Units by mouth daily.  . Cyanocobalamin (VITAMIN B 12 PO) Take 1 tablet by mouth daily.   . cyclobenzaprine (FLEXERIL) 5 MG tablet Take 1 tablet (5 mg total) by mouth at bedtime.  . fluticasone (FLONASE ALLERGY RELIEF) 50 MCG/ACT nasal spray Place 1 spray into both nostrils daily.  . Lactobacillus (PROBIOTIC ACIDOPHILUS PO) Take 1 capsule by mouth daily.  Marland Kitchen lisinopril-hydrochlorothiazide (PRINZIDE,ZESTORETIC) 20-12.5 MG tablet Take 1 tablet by mouth daily.  Marland Kitchen MAGNESIUM PO Take by mouth.  . meloxicam (MOBIC) 7.5 MG tablet Take 1 tablet (7.5 mg total) by mouth daily as needed for pain.  . naproxen sodium (ANAPROX) 220 MG tablet Take 220 mg by mouth at bedtime.  . Nutritional Supplements (MULTIPLE SCLEROSIS SUPPORT PO) Take 2 capsules by mouth 2 times daily at 12 noon and 4 pm.  . pravastatin (PRAVACHOL) 20 MG tablet Take 20 mg by mouth daily.  Marland Kitchen PRESCRIPTION MEDICATION Research medication for MS ALKS 8700 2 capsules twice a day.  . sertraline (ZOLOFT) 50 MG tablet TAKE 1/2 TAB FOR 1 WEEK, THEN 1 TAB ONCE A DAY ORALLY 90 DAYS  . vitamin C (ASCORBIC ACID) 500 MG tablet Take 500 mg by mouth daily.  . [DISCONTINUED] montelukast (SINGULAIR) 10 MG tablet Take 10 mg by mouth at bedtime.   No facility-administered encounter medications on file as of 01/31/2017.     Allergy: No Known Allergies  Social Hx:   Social History   Social History  . Marital status: Married    Spouse name: N/A  . Number of children: N/A  . Years of education: N/A   Occupational History  . Not on file.   Social History Main Topics  . Smoking status: Never Smoker  . Smokeless tobacco: Never Used  . Alcohol use No  . Drug use: No  . Sexual activity: Yes    Birth control/ protection: Diaphragm   Other Topics Concern  . Not on file   Social History Narrative  . No narrative on file    Past Surgical Hx:  Past Surgical History:  Procedure Laterality Date   . DILATION AND CURETTAGE OF UTERUS  1994    Past Medical Hx:  Past Medical History:  Diagnosis Date  . Anxiety   . Depression   . Genital warts   . Hypercholesteremia   . Hypertension   . Multiple sclerosis ([place]) 01/2016  . Vision abnormalities     Past Gynecological History:  SVD x 2. High risk HPV detected February 2018. No LMP recorded.  Family Hx:  Family History  Problem Relation Age of Onset  . Diabetes Mellitus II Mother   . COPD Father     Review of Systems:  Constitutional  Feels well,   +fatigue ENT Normal appearing ears and nares bilaterally Skin/Breast  No rash, sores, jaundice, itching, dryness Cardiovascular  No chest pain, shortness of breath, or edema  Pulmonary  No cough or wheeze.  Gastro Intestinal  No nausea, vomitting, or diarrhoea. No bright red blood per rectum, no abdominal pain, change in bowel movement, or constipation.  Genito Urinary  No frequency, urgency, dysuria, no bleeding or irregular menses Musculo Skeletal  No myalgia, arthralgia,  joint swelling or pain  Neurologic  No weakness, numbness, change in gait,  Psychology  No depression, anxiety, insomnia.   Vitals:  Blood pressure 118/80, pulse 70, temperature 98.3 F (36.8 C), temperature source Oral, resp. rate 18, height 5' 3"  (1.6 m), weight 215 lb 6.4 oz (97.7 kg).  Physical Exam: WD in NAD Neck  Supple NROM, without any enlargements.  Lymph Node Survey No cervical supraclavicular or inguinal adenopathy Cardiovascular  Pulse normal rate, regularity and rhythm. S1 and S2 normal.  Lungs  Clear to auscultation bilateraly, without wheezes/crackles/rhonchi. Good air movement.  Skin  No rash/lesions/breakdown  Psychiatry  Alert and oriented to person, place, and time  Abdomen  Normoactive bowel sounds, abdomen soft, non-tender and obese without evidence of hernia.  Back No CVA tenderness Genito Urinary  Vulva/vagina: Normal external female genitalia.  No lesions. No  discharge or bleeding.  Bladder/urethra:  No lesions or masses, well supported bladder  Vagina: norma  Cervix: Normal appearing, no lesions. LEEP site healing well. Adequate residual cervical length and stroma  Uterus:  Retroverted, small, mobile, no parametrial involvement or nodularity.  Adnexa: no palpable masses. Rectal  deferred  Extremities  No bilateral cyanosis, clubbing or edema.   Donaciano Eva, MD  01/31/2017, 11:36 AM

## 2017-02-14 ENCOUNTER — Telehealth: Payer: Self-pay | Admitting: *Deleted

## 2017-02-14 MED ORDER — MELOXICAM 7.5 MG PO TABS: 7.5000 mg | ORAL_TABLET | Freq: Every day | ORAL | 5 refills | Status: DC | PRN

## 2017-02-14 NOTE — Telephone Encounter (Signed)
Meloxicam escribed to [facility] per faxed request/fim

## 2017-02-15 ENCOUNTER — Other Ambulatory Visit: Payer: Self-pay | Admitting: Neurology

## 2017-02-15 ENCOUNTER — Encounter: Payer: Self-pay | Admitting: Obstetrics and Gynecology

## 2017-02-15 ENCOUNTER — Ambulatory Visit (INDEPENDENT_AMBULATORY_CARE_PROVIDER_SITE_OTHER): Payer: 59 | Admitting: Obstetrics and Gynecology

## 2017-02-15 VITALS — BP 122/60 | HR 80 | Resp 16 | Wt 217.0 lb

## 2017-02-15 DIAGNOSIS — Z9889 Other specified postprocedural states: Secondary | ICD-10-CM | POA: Diagnosis not present

## 2017-02-15 NOTE — Progress Notes (Signed)
GYNECOLOGY  VISIT   HPI: 46 y.o.   Married  Caucasian  female   [phone] with Patient's last menstrual period was 02/14/2017.   here for follow up LEEP, the patient had a LEEP for CIN III. There was a small focus on the pathology that was suspicious for invasive cervical cancer. She has a hysterectomy set up with Dr Denman George next week  GYNECOLOGIC HISTORY: Patient's last menstrual period was 02/14/2017. Contraception:none  Menopausal hormone therapy: none         OB History    Gravida Para Term Preterm AB Living   3 2 2  0 1 2   SAB TAB Ectopic Multiple Live Births   0 0 0 0 0         Patient Active Problem List   Diagnosis Date Noted  . Cervical cancer, FIGO stage IA1 ([place]) 01/31/2017  . Low serum vitamin D 02/23/2016  . Other fatigue 02/23/2016  . Insomnia 02/23/2016  . Multiple sclerosis ([place])   . Spinal stenosis in cervical region 02/04/2016  . Hypokalemia 02/04/2016  . Other allergic rhinitis   . Aphasia 02/03/2016  . Paresthesia of left arm 02/03/2016  . Hypertension 02/03/2016  . Hyperlipidemia 02/03/2016  . Acquired CNS lesion 02/03/2016    Past Medical History:  Diagnosis Date  . Anxiety   . Cancer ([place]) 01/2017   cervical cancer   . Depression   . Genital warts   . Hypercholesteremia   . Hypertension   . Multiple sclerosis ([place]) 01/2016  . Vision abnormalities     Past Surgical History:  Procedure Laterality Date  . CERVICAL BIOPSY  W/ LOOP ELECTRODE EXCISION    . COLPOSCOPY    . DILATION AND CURETTAGE OF UTERUS  1994    Current Outpatient Prescriptions  Medication Sig Dispense Refill  . BIOTIN 5000 PO Take 1 capsule by mouth daily.    . cetirizine (ZYRTEC) 10 MG tablet Take 10 mg by mouth daily.    . cholecalciferol (VITAMIN D) 1000 units tablet Take 1,000 Units by mouth daily.    . Cyanocobalamin (VITAMIN B 12 PO) Take 1 tablet by mouth daily.     . cyclobenzaprine (FLEXERIL) 5 MG tablet Take 1 tablet (5 mg total) by mouth at bedtime. 30 tablet 11   . fluticasone (FLONASE ALLERGY RELIEF) 50 MCG/ACT nasal spray Place 1 spray into both nostrils daily.    . Lactobacillus (PROBIOTIC ACIDOPHILUS PO) Take 1 capsule by mouth daily.    Marland Kitchen lisinopril-hydrochlorothiazide (PRINZIDE,ZESTORETIC) 20-12.5 MG tablet Take 1 tablet by mouth daily.    Marland Kitchen MAGNESIUM PO Take by mouth.    . meloxicam (MOBIC) 7.5 MG tablet Take 1 tablet (7.5 mg total) by mouth daily as needed for pain. 30 tablet 5  . naproxen sodium (ANAPROX) 220 MG tablet Take 220 mg by mouth at bedtime.    . Nutritional Supplements (MULTIPLE SCLEROSIS SUPPORT PO) Take 2 capsules by mouth 2 times daily at 12 noon and 4 pm.    . pravastatin (PRAVACHOL) 20 MG tablet Take 20 mg by mouth daily.  3  . PRESCRIPTION MEDICATION Research medication for MS ALKS 8700 2 capsules twice a day.    . sertraline (ZOLOFT) 50 MG tablet TAKE 1/2 TAB FOR 1 WEEK, THEN 1 TAB ONCE A DAY ORALLY 90 DAYS  3  . vitamin C (ASCORBIC ACID) 500 MG tablet Take 500 mg by mouth daily.     No current facility-administered medications for this visit.  ALLERGIES: Patient has no known allergies.  Family History  Problem Relation Age of Onset  . Diabetes Mellitus II Mother   . COPD Father     Social History   Social History  . Marital status: Married    Spouse name: N/A  . Number of children: N/A  . Years of education: N/A   Occupational History  . Not on file.   Social History Main Topics  . Smoking status: Never Smoker  . Smokeless tobacco: Never Used  . Alcohol use No  . Drug use: No  . Sexual activity: Yes    Birth control/ protection: Diaphragm   Other Topics Concern  . Not on file   Social History Narrative  . No narrative on file    Review of Systems  Constitutional: Negative.   HENT: Negative.   Eyes: Negative.   Respiratory: Negative.   Cardiovascular: Negative.   Gastrointestinal: Negative.   Genitourinary: Negative.   Musculoskeletal: Negative.   Skin: Negative.   Neurological:  Negative.   Endo/Heme/Allergies: Negative.   Psychiatric/Behavioral: Negative.     PHYSICAL EXAMINATION:    BP 122/60 (BP Location: Right Arm, Patient Position: Sitting, Cuff Size: Normal)   Pulse 80   Resp 16   Wt 217 lb (98.4 kg)   LMP 02/14/2017   BMI 38.44 kg/m     General appearance: alert, cooperative and appears stated age  Pelvic: External genitalia:  no lesions              Urethra:  normal appearing urethra with no masses, tenderness or lesions              Bartholins and Skenes: normal                 Vagina: normal appearing vagina with normal color and discharge, no lesions              Cervix: no lesions, well healed from her leep               Chaperone was present for exam.  ASSESSMENT S/P LEEP for CIN III, small focus of possible microinvasion. Well healed    PLAN She has a hysterectomy with Dr Denman George next week   An After Visit Summary was printed and given to the patient.

## 2017-02-16 ENCOUNTER — Ambulatory Visit: Payer: Self-pay | Admitting: Neurology

## 2017-02-17 ENCOUNTER — Encounter: Payer: Self-pay | Admitting: Neurology

## 2017-02-17 NOTE — Patient Instructions (Addendum)
Beverly Beltran  02/17/2017   Your procedure is scheduled on: 02-22-17  Report to [facility] Main  Entrance Take [address]  elevators to 3rd floor to  [place] at [phone].   Call this number if you have problems the morning of surgery [phone]   Remember: ONLY 1 PERSON MAY GO WITH YOU TO SHORT STAY TO GET  READY MORNING OF [place].  Do not eat food After Midnight. You may have clear liquids (see menu below) from midnight until 745am day of surgery. Nothing by mouth after 745am!!     Take these medicines the morning of surgery with A SIP OF WATER: MS medication unless directed otherwise                                 You may not have any metal on your body including hair pins and              piercings  Do not wear jewelry, make-up, lotions, powders or perfumes, deodorant             Do not wear nail polish.  Do not shave  48 hours prior to surgery.             Do not bring valuables to the hospital. [place].  Contacts, dentures or bridgework may not be worn into surgery.  Leave suitcase in the car. After surgery it may be brought to your room.               Please read over the following fact sheets you were given: _____________________________________________________________________     CLEAR LIQUID DIET   Foods Allowed                                                                     Foods Excluded  Coffee and tea, regular and decaf                             liquids that you cannot  Plain Jell-O in any flavor                                             see through such as: Fruit ices (not with fruit pulp)                                     milk, soups, orange juice  Iced Popsicles                                    All solid food Carbonated beverages, regular and diet  Cranberry, grape and apple juices Sports drinks like Gatorade Lightly seasoned  clear broth or consume(fat free) Sugar, honey syrup  Sample Menu Breakfast                                Lunch                                     Supper Cranberry juice                    Beef broth                            Chicken broth Jell-O                                     Grape juice                           Apple juice Coffee or tea                        Jell-O                                      Popsicle                                                Coffee or tea                        Coffee or tea  _____________________________________________________________________             Eat a light diet the day before surgery.  Examples including soups, broths, toast, yogurt, mashed potatoes.  Things to avoid include carbonated beverages (fizzy beverages), raw fruits and raw vegetables, or beans.  If your bowels are filled with gas, your surgeon will have difficulty visualizing your pelvic organs which increases your surgical risks.   [place] - Preparing for Surgery Before surgery, you can play an important role.  Because skin is not sterile, your skin needs to be as free of germs as possible.  You can reduce the number of germs on your skin by washing with CHG (chlorahexidine gluconate) soap before surgery.  CHG is an antiseptic cleaner which kills germs and bonds with the skin to continue killing germs even after washing. Please DO NOT use if you have an allergy to CHG or antibacterial soaps.  If your skin becomes reddened/irritated stop using the CHG and inform your nurse when you arrive at Short Stay. Do not shave (including legs and underarms) for at least 48 hours prior to the first CHG shower.  You may shave your face/neck. Please follow these instructions carefully:  1.  Shower with CHG Soap the night before surgery and the  morning of Surgery.  2.  If you choose to wash your hair, wash your hair first as usual with your  normal  shampoo.  3.  After you shampoo,  rinse your hair and body thoroughly to remove the  shampoo.                           4.  Use CHG as you would any other liquid soap.  You can apply chg directly  to the skin and wash                       Gently with a scrungie or clean washcloth.  5.  Apply the CHG Soap to your body ONLY FROM THE NECK DOWN.   Do not use on face/ open                           Wound or open sores. Avoid contact with eyes, ears mouth and genitals (private parts).                       Wash face,  Genitals (private parts) with your normal soap.             6.  Wash thoroughly, paying special attention to the area where your surgery  will be performed.  7.  Thoroughly rinse your body with warm water from the neck down.  8.  DO NOT shower/wash with your normal soap after using and rinsing off  the CHG Soap.                9.  Pat yourself dry with a clean towel.            10.  Wear clean pajamas.            11.  Place clean sheets on your bed the night of your first shower and do not  sleep with pets. Day of Surgery : Do not apply any lotions/deodorants the morning of surgery.  Please wear clean clothes to the hospital/surgery center.  FAILURE TO FOLLOW THESE INSTRUCTIONS MAY RESULT IN THE CANCELLATION OF YOUR SURGERY PATIENT SIGNATURE_________________________________  NURSE SIGNATURE__________________________________  ________________________________________________________________________   [name]  An incentive spirometer is a tool that can help keep your lungs clear and active. This tool measures how well you are filling your lungs with each breath. Taking long deep breaths may help reverse or decrease the chance of developing breathing (pulmonary) problems (especially infection) following:  A long period of time when you are unable to move or be active. BEFORE THE PROCEDURE   If the spirometer includes an indicator to show your best effort, your nurse or respiratory therapist will set it  to a desired goal.  If possible, sit up straight or lean slightly forward. Try not to slouch.  Hold the incentive spirometer in an upright position. INSTRUCTIONS FOR USE  1. Sit on the edge of your bed if possible, or sit up as far as you can in bed or on a chair. 2. Hold the incentive spirometer in an upright position. 3. Breathe out normally. 4. Place the mouthpiece in your mouth and seal your lips tightly around it. 5. Breathe in slowly and as deeply as possible, raising the piston or the ball toward the top of the column. 6. Hold your breath for 3-5 seconds or for as long as possible. Allow the piston or ball to fall to the bottom of the column. 7. Remove the mouthpiece from your mouth and breathe out normally. 8. Rest for  a few seconds and repeat Steps 1 through 7 at least 10 times every 1-2 hours when you are awake. Take your time and take a few normal breaths between deep breaths. 9. The spirometer may include an indicator to show your best effort. Use the indicator as a goal to work toward during each repetition. 10. After each set of 10 deep breaths, practice coughing to be sure your lungs are clear. If you have an incision (the cut made at the time of surgery), support your incision when coughing by placing a pillow or rolled up towels firmly against it. Once you are able to get out of bed, walk around indoors and cough well. You may stop using the incentive spirometer when instructed by your caregiver.  RISKS AND COMPLICATIONS  Take your time so you do not get dizzy or light-headed.  If you are in pain, you may need to take or ask for pain medication before doing incentive spirometry. It is harder to take a deep breath if you are having pain. AFTER USE  Rest and breathe slowly and easily.  It can be helpful to keep track of a log of your progress. Your caregiver can provide you with a simple table to help with this. If you are using the spirometer at home, follow these  instructions: [place] IF:   You are having difficultly using the spirometer.  You have trouble using the spirometer as often as instructed.  Your pain medication is not giving enough relief while using the spirometer.  You develop fever of 100.5 F (38.1 C) or higher. SEEK IMMEDIATE MEDICAL CARE IF:   You cough up bloody sputum that had not been present before.  You develop fever of 102 F (38.9 C) or greater.  You develop worsening pain at or near the incision site. MAKE SURE YOU:   Understand these instructions.  Will watch your condition.  Will get help right away if you are not doing well or get worse. Document Released: 02/14/2007 Document Revised: 12/27/2011 Document Reviewed: 04/17/2007 ExitCare Patient Information 2014 ExitCare, [place].   ________________________________________________________________________  WHAT IS A BLOOD TRANSFUSION? Blood Transfusion Information  A transfusion is the replacement of blood or some of its parts. Blood is made up of multiple cells which provide different functions.  Red blood cells carry oxygen and are used for blood loss replacement.  White blood cells fight against infection.  Platelets control bleeding.  Plasma helps clot blood.  Other blood products are available for specialized needs, such as hemophilia or other clotting disorders. BEFORE THE TRANSFUSION  Who gives blood for transfusions?   Healthy volunteers who are fully evaluated to make sure their blood is safe. This is blood bank blood. Transfusion therapy is the safest it has ever been in the practice of medicine. Before blood is taken from a donor, a complete history is taken to make sure that person has no history of diseases nor engages in risky social behavior (examples are intravenous drug use or sexual activity with multiple partners). The donor's travel history is screened to minimize risk of transmitting infections, such as malaria. The donated  blood is tested for signs of infectious diseases, such as HIV and hepatitis. The blood is then tested to be sure it is compatible with you in order to minimize the chance of a transfusion reaction. If you or a relative donates blood, this is often done in anticipation of surgery and is not appropriate for emergency situations. It takes many days  to process the donated blood. RISKS AND COMPLICATIONS Although transfusion therapy is very safe and saves many lives, the main dangers of transfusion include:   Getting an infectious disease.  Developing a transfusion reaction. This is an allergic reaction to something in the blood you were given. Every precaution is taken to prevent this. The decision to have a blood transfusion has been considered carefully by your caregiver before blood is given. Blood is not given unless the benefits outweigh the risks. AFTER THE TRANSFUSION  Right after receiving a blood transfusion, you will usually feel much better and more energetic. This is especially true if your red blood cells have gotten low (anemic). The transfusion raises the level of the red blood cells which carry oxygen, and this usually causes an energy increase.  The nurse administering the transfusion will monitor you carefully for complications. HOME CARE INSTRUCTIONS  No special instructions are needed after a transfusion. You may find your energy is better. Speak with your caregiver about any limitations on activity for underlying diseases you may have. SEEK MEDICAL CARE IF:   Your condition is not improving after your transfusion.  You develop redness or irritation at the intravenous (IV) site. SEEK IMMEDIATE MEDICAL CARE IF:  Any of the following symptoms occur over the next 12 hours:  Shaking chills.  You have a temperature by mouth above 102 F (38.9 C), not controlled by medicine.  Chest, back, or muscle pain.  People around you feel you are not acting correctly or are  confused.  Shortness of breath or difficulty breathing.  Dizziness and fainting.  You get a rash or develop hives.  You have a decrease in urine output.  Your urine turns a dark color or changes to pink, red, or brown. Any of the following symptoms occur over the next 10 days:  You have a temperature by mouth above 102 F (38.9 C), not controlled by medicine.  Shortness of breath.  Weakness after normal activity.  The white part of the eye turns yellow (jaundice).  You have a decrease in the amount of urine or are urinating less often.  Your urine turns a dark color or changes to pink, red, or brown. Document Released: 10/01/2000 Document Revised: 12/27/2011 Document Reviewed: 05/20/2008 [facility] Patient Information 2014 [place], [place].  _______________________________________________________________________

## 2017-02-17 NOTE — Progress Notes (Signed)
LOV note neurology Dr Felecia Shelling 04-05-16 epic

## 2017-02-18 ENCOUNTER — Encounter (HOSPITAL_COMMUNITY): Payer: Self-pay

## 2017-02-18 ENCOUNTER — Encounter (HOSPITAL_COMMUNITY)
Admission: RE | Admit: 2017-02-18 | Discharge: 2017-02-18 | Disposition: A | Discharge status: Home / Self Care | Payer: 59 | Source: Ambulatory Visit | Attending: Gynecologic Oncology | Admitting: Gynecologic Oncology

## 2017-02-18 DIAGNOSIS — Z01812 Encounter for preprocedural laboratory examination: Secondary | ICD-10-CM | POA: Diagnosis not present

## 2017-02-18 DIAGNOSIS — C539 Malignant neoplasm of cervix uteri, unspecified: Secondary | ICD-10-CM | POA: Diagnosis not present

## 2017-02-18 HISTORY — DX: Anesthesia of skin: R20.0

## 2017-02-18 LAB — COMPREHENSIVE METABOLIC PANEL
ALT: 33 U/L (ref 14–54)
AST: 26 U/L (ref 15–41)
Albumin: 4.5 g/dL (ref 3.5–5.0)
Alkaline Phosphatase: 55 U/L (ref 38–126)
Anion gap: 9 (ref 5–15)
BUN: 11 mg/dL (ref 6–20)
CHLORIDE: 101 mmol/L (ref 101–111)
CO2: 28 mmol/L (ref 22–32)
CREATININE: 0.78 mg/dL (ref 0.44–1.00)
Calcium: 9.7 mg/dL (ref 8.9–10.3)
GFR calc Af Amer: >60 mL/min (ref >60)
GFR calc non Af Amer: >60 mL/min (ref >60)
Glucose, Bld: 95 mg/dL (ref 65–99)
POTASSIUM: 4 mmol/L (ref 3.5–5.1)
SODIUM: 138 mmol/L (ref 135–145)
Total Bilirubin: 0.4 mg/dL (ref 0.3–1.2)
Total Protein: 8.3 g/dL — ABNORMAL HIGH (ref 6.5–8.1)

## 2017-02-18 LAB — CBC WITH DIFFERENTIAL/PLATELET
BASOS ABS: 0 10*3/uL (ref 0.0–0.1)
BASOS PCT: 0 %
EOS ABS: 0.2 10*3/uL (ref 0.0–0.7)
EOS PCT: 2 %
HCT: 40 % (ref 36.0–46.0)
Hemoglobin: 13.1 g/dL (ref 12.0–15.0)
Lymphocytes Relative: 28 %
Lymphs Abs: 1.7 10*3/uL (ref 0.7–4.0)
MCH: 26.4 pg (ref 26.0–34.0)
MCHC: 32.8 g/dL (ref 30.0–36.0)
MCV: 80.6 fL (ref 78.0–100.0)
MONO ABS: 0.5 10*3/uL (ref 0.1–1.0)
Monocytes Relative: 9 %
Neutro Abs: 3.8 10*3/uL (ref 1.7–7.7)
Neutrophils Relative %: 61 %
PLATELETS: 268 10*3/uL (ref 150–400)
RBC: 4.96 MIL/uL (ref 3.87–5.11)
RDW: 13.7 % (ref 11.5–15.5)
WBC: 6.3 10*3/uL (ref 4.0–10.5)

## 2017-02-18 NOTE — Progress Notes (Signed)
LVMM on nursing line with cancer center requesting info about Denman George instructions for patient's MS research medication in relation to surgery.

## 2017-02-18 NOTE — Progress Notes (Signed)
Received call back from cancer center; informed that Nurse Practitioner had spoken with Dr Denman George; Gilford Rile patient to continue MS research medication

## 2017-02-19 LAB — HEMOGLOBIN A1C
Hgb A1c MFr Bld: 5.8 % — ABNORMAL HIGH (ref 4.8–5.6)
Mean Plasma Glucose: 120 mg/dL

## 2017-02-19 LAB — ABO/RH: ABO/RH(D): A POS

## 2017-02-21 NOTE — Progress Notes (Signed)
Called pt. To inform her we would need another urine morning of surgery . This would be done in short stay.

## 2017-02-21 NOTE — Progress Notes (Signed)
Pt. Urine lost in lab from preop appt. Called Dr. Denman George office at [phone] and per Joylene John we can get ua and urine pregnancy the morning of surgery.

## 2017-02-22 ENCOUNTER — Ambulatory Visit (HOSPITAL_COMMUNITY): Payer: 59 | Admitting: Certified Registered Nurse Anesthetist

## 2017-02-22 ENCOUNTER — Encounter (HOSPITAL_COMMUNITY)
Admission: RE | Disposition: A | Discharge status: Home / Self Care | Payer: Self-pay | Source: Ambulatory Visit | Attending: Gynecologic Oncology

## 2017-02-22 ENCOUNTER — Ambulatory Visit (HOSPITAL_COMMUNITY)
Admission: RE | Admit: 2017-02-22 | Discharge: 2017-02-23 | Disposition: A | Discharge status: Home / Self Care | Payer: 59 | Source: Ambulatory Visit | Attending: Gynecologic Oncology | Admitting: Gynecologic Oncology

## 2017-02-22 ENCOUNTER — Encounter (HOSPITAL_COMMUNITY): Payer: Self-pay | Admitting: *Deleted

## 2017-02-22 DIAGNOSIS — I10 Essential (primary) hypertension: Secondary | ICD-10-CM | POA: Diagnosis not present

## 2017-02-22 DIAGNOSIS — Z792 Long term (current) use of antibiotics: Secondary | ICD-10-CM

## 2017-02-22 DIAGNOSIS — Z7951 Long term (current) use of inhaled steroids: Secondary | ICD-10-CM | POA: Insufficient documentation

## 2017-02-22 DIAGNOSIS — Z7952 Long term (current) use of systemic steroids: Secondary | ICD-10-CM | POA: Diagnosis not present

## 2017-02-22 DIAGNOSIS — E7800 Pure hypercholesterolemia, unspecified: Secondary | ICD-10-CM | POA: Diagnosis not present

## 2017-02-22 DIAGNOSIS — F329 Major depressive disorder, single episode, unspecified: Secondary | ICD-10-CM | POA: Insufficient documentation

## 2017-02-22 DIAGNOSIS — C539 Malignant neoplasm of cervix uteri, unspecified: Secondary | ICD-10-CM | POA: Diagnosis present

## 2017-02-22 DIAGNOSIS — F419 Anxiety disorder, unspecified: Secondary | ICD-10-CM | POA: Insufficient documentation

## 2017-02-22 DIAGNOSIS — G35 Multiple sclerosis: Secondary | ICD-10-CM | POA: Insufficient documentation

## 2017-02-22 DIAGNOSIS — Z79899 Other long term (current) drug therapy: Secondary | ICD-10-CM | POA: Insufficient documentation

## 2017-02-22 DIAGNOSIS — Z006 Encounter for examination for normal comparison and control in clinical research program: Secondary | ICD-10-CM | POA: Diagnosis not present

## 2017-02-22 DIAGNOSIS — E785 Hyperlipidemia, unspecified: Secondary | ICD-10-CM | POA: Diagnosis not present

## 2017-02-22 HISTORY — PX: ROBOTIC ASSISTED TOTAL HYSTERECTOMY WITH BILATERAL SALPINGO OOPHERECTOMY: SHX6086

## 2017-02-22 LAB — TYPE AND SCREEN
ABO/RH(D): A POS
ANTIBODY SCREEN: NEGATIVE

## 2017-02-22 LAB — URINALYSIS, ROUTINE W REFLEX MICROSCOPIC
Bilirubin Urine: NEGATIVE
Glucose, UA: NEGATIVE mg/dL
KETONES UR: NEGATIVE mg/dL
NITRITE: NEGATIVE
PROTEIN: 30 mg/dL — AB
Specific Gravity, Urine: 1.019 (ref 1.005–1.030)
pH: 5 (ref 5.0–8.0)

## 2017-02-22 LAB — HCG, SERUM, QUALITATIVE: PREG SERUM: NEGATIVE

## 2017-02-22 SURGERY — HYSTERECTOMY, TOTAL, ROBOT-ASSISTED, LAPAROSCOPIC, WITH BILATERAL SALPINGO-OOPHORECTOMY
Anesthesia: General | Laterality: Bilateral

## 2017-02-22 MED ORDER — MIDAZOLAM HCL 2 MG/2ML IJ SOLN
INTRAMUSCULAR | Status: AC
  Filled 2017-02-22: qty 2

## 2017-02-22 MED ORDER — PROPOFOL 10 MG/ML IV BOLUS
INTRAVENOUS | Status: DC | PRN
  Administered 2017-02-22: 180 mg via INTRAVENOUS

## 2017-02-22 MED ORDER — PROMETHAZINE HCL 25 MG/ML IJ SOLN: 6.2500 mg | INTRAMUSCULAR | Status: DC | PRN

## 2017-02-22 MED ORDER — LIDOCAINE 2% (20 MG/ML) 5 ML SYRINGE
INTRAMUSCULAR | Status: AC
  Filled 2017-02-22: qty 5

## 2017-02-22 MED ORDER — DEXAMETHASONE SODIUM PHOSPHATE 4 MG/ML IJ SOLN
INTRAMUSCULAR | Status: DC | PRN
  Administered 2017-02-22: 10 mg via INTRAVENOUS

## 2017-02-22 MED ORDER — HYDROCHLOROTHIAZIDE 12.5 MG PO CAPS
12.5000 mg | ORAL_CAPSULE | Freq: Every day | ORAL | Status: DC
  Administered 2017-02-23: 12.5 mg via ORAL
  Filled 2017-02-22: qty 1

## 2017-02-22 MED ORDER — ENOXAPARIN SODIUM 40 MG/0.4ML ~~LOC~~ SOLN
40.0000 mg | SUBCUTANEOUS | Status: DC
  Administered 2017-02-23: 40 mg via SUBCUTANEOUS
  Filled 2017-02-22: qty 0.4

## 2017-02-22 MED ORDER — FENTANYL CITRATE (PF) 100 MCG/2ML IJ SOLN
INTRAMUSCULAR | Status: DC | PRN
  Administered 2017-02-22: 50 ug via INTRAVENOUS
  Administered 2017-02-22 (×2): 100 ug via INTRAVENOUS

## 2017-02-22 MED ORDER — IBUPROFEN 400 MG PO TABS: 800.0000 mg | ORAL_TABLET | Freq: Three times a day (TID) | ORAL | Status: DC | PRN

## 2017-02-22 MED ORDER — FENTANYL CITRATE (PF) 250 MCG/5ML IJ SOLN
INTRAMUSCULAR | Status: AC
  Filled 2017-02-22: qty 5

## 2017-02-22 MED ORDER — DEXAMETHASONE SODIUM PHOSPHATE 10 MG/ML IJ SOLN
INTRAMUSCULAR | Status: AC
  Filled 2017-02-22: qty 1

## 2017-02-22 MED ORDER — ONDANSETRON HCL 4 MG/2ML IJ SOLN
INTRAMUSCULAR | Status: DC | PRN
  Administered 2017-02-22: 4 mg via INTRAVENOUS

## 2017-02-22 MED ORDER — LACTATED RINGERS IR SOLN
Status: DC | PRN
  Administered 2017-02-22: 1000 mL

## 2017-02-22 MED ORDER — PHENYLEPHRINE 40 MCG/ML (10ML) SYRINGE FOR IV PUSH (FOR BLOOD PRESSURE SUPPORT)
PREFILLED_SYRINGE | INTRAVENOUS | Status: AC
  Filled 2017-02-22: qty 10

## 2017-02-22 MED ORDER — LIDOCAINE HCL (CARDIAC) 20 MG/ML IV SOLN
INTRAVENOUS | Status: DC | PRN
  Administered 2017-02-22: 100 mg via INTRAVENOUS

## 2017-02-22 MED ORDER — LISINOPRIL 20 MG PO TABS
20.0000 mg | ORAL_TABLET | Freq: Every day | ORAL | Status: DC
  Administered 2017-02-23: 11:00:00 20 mg via ORAL
  Filled 2017-02-22: qty 1

## 2017-02-22 MED ORDER — ONDANSETRON HCL 4 MG/2ML IJ SOLN
INTRAMUSCULAR | Status: AC
  Filled 2017-02-22: qty 2

## 2017-02-22 MED ORDER — HYDROMORPHONE HCL 1 MG/ML IJ SOLN: 0.2000 mg | INTRAMUSCULAR | Status: DC | PRN

## 2017-02-22 MED ORDER — MIDAZOLAM HCL 5 MG/5ML IJ SOLN
INTRAMUSCULAR | Status: DC | PRN
  Administered 2017-02-22: 2 mg via INTRAVENOUS

## 2017-02-22 MED ORDER — SUGAMMADEX SODIUM 200 MG/2ML IV SOLN
INTRAVENOUS | Status: AC
  Filled 2017-02-22: qty 2

## 2017-02-22 MED ORDER — ROCURONIUM BROMIDE 50 MG/5ML IV SOSY
PREFILLED_SYRINGE | INTRAVENOUS | Status: AC
  Filled 2017-02-22: qty 5

## 2017-02-22 MED ORDER — ONDANSETRON HCL 4 MG/2ML IJ SOLN: 4.0000 mg | Freq: Four times a day (QID) | INTRAMUSCULAR | Status: DC | PRN

## 2017-02-22 MED ORDER — LACTATED RINGERS IV SOLN
INTRAVENOUS | Status: DC
  Administered 2017-02-22 (×2): via INTRAVENOUS

## 2017-02-22 MED ORDER — HYDROMORPHONE HCL 1 MG/ML IJ SOLN
0.2500 mg | INTRAMUSCULAR | Status: DC | PRN
  Administered 2017-02-22: 0.5 mg via INTRAVENOUS

## 2017-02-22 MED ORDER — ONDANSETRON HCL 4 MG PO TABS: 4.0000 mg | ORAL_TABLET | Freq: Four times a day (QID) | ORAL | Status: DC | PRN

## 2017-02-22 MED ORDER — STERILE WATER FOR IRRIGATION IR SOLN
Status: DC | PRN
  Administered 2017-02-22: 1000 mL

## 2017-02-22 MED ORDER — KCL IN DEXTROSE-NACL 20-5-0.45 MEQ/L-%-% IV SOLN
INTRAVENOUS | Status: DC
  Filled 2017-02-22: qty 1000

## 2017-02-22 MED ORDER — HYDROMORPHONE HCL 1 MG/ML IJ SOLN
INTRAMUSCULAR | Status: AC
  Filled 2017-02-22: qty 1

## 2017-02-22 MED ORDER — SUGAMMADEX SODIUM 200 MG/2ML IV SOLN
INTRAVENOUS | Status: DC | PRN
  Administered 2017-02-22: 200 mg via INTRAVENOUS

## 2017-02-22 MED ORDER — ROCURONIUM BROMIDE 100 MG/10ML IV SOLN
INTRAVENOUS | Status: DC | PRN
  Administered 2017-02-22: 50 mg via INTRAVENOUS
  Administered 2017-02-22: 20 mg via INTRAVENOUS

## 2017-02-22 MED ORDER — SERTRALINE HCL 50 MG PO TABS
50.0000 mg | ORAL_TABLET | Freq: Every day | ORAL | Status: DC
  Administered 2017-02-22: 50 mg via ORAL
  Filled 2017-02-22: qty 1

## 2017-02-22 MED ORDER — PROPOFOL 10 MG/ML IV BOLUS
INTRAVENOUS | Status: AC
  Filled 2017-02-22: qty 40

## 2017-02-22 MED ORDER — PRAVASTATIN SODIUM 20 MG PO TABS
20.0000 mg | ORAL_TABLET | Freq: Every day | ORAL | Status: DC
  Administered 2017-02-22: 21:00:00 20 mg via ORAL
  Filled 2017-02-22: qty 1

## 2017-02-22 MED ORDER — LISINOPRIL-HYDROCHLOROTHIAZIDE 20-12.5 MG PO TABS: 1.0000 | ORAL_TABLET | Freq: Every day | ORAL | Status: DC

## 2017-02-22 MED ORDER — CEFAZOLIN SODIUM-DEXTROSE 2-4 GM/100ML-% IV SOLN
2.0000 g | INTRAVENOUS | Status: AC
  Administered 2017-02-22: 2 g via INTRAVENOUS
  Filled 2017-02-22: qty 100

## 2017-02-22 MED ORDER — OXYCODONE-ACETAMINOPHEN 5-325 MG PO TABS
1.0000 | ORAL_TABLET | ORAL | Status: DC | PRN
Start: 2017-02-22 — End: 2017-02-23

## 2017-02-22 MED ORDER — GABAPENTIN 300 MG PO CAPS
600.0000 mg | ORAL_CAPSULE | Freq: Every day | ORAL | Status: AC
  Administered 2017-02-22: 21:00:00 600 mg via ORAL
  Filled 2017-02-22: qty 2

## 2017-02-22 MED ORDER — TRAMADOL HCL 50 MG PO TABS
100.0000 mg | ORAL_TABLET | Freq: Four times a day (QID) | ORAL | Status: DC | PRN
  Administered 2017-02-22: 21:00:00 100 mg via ORAL
  Filled 2017-02-22: qty 2

## 2017-02-22 SURGICAL SUPPLY — 60 items
APPLICATOR SURGIFLO ENDO (HEMOSTASIS) IMPLANT
BAG LAPAROSCOPIC 12 15 PORT 16 (BASKET) IMPLANT
BAG RETRIEVAL 12/15 (BASKET)
CHLORAPREP W/TINT 26ML (MISCELLANEOUS) ×2 IMPLANT
COVER BACK TABLE 60X90IN (DRAPES) ×2 IMPLANT
COVER SURGICAL LIGHT HANDLE (MISCELLANEOUS) ×2 IMPLANT
COVER TIP SHEARS 8 DVNC (MISCELLANEOUS) ×1 IMPLANT
COVER TIP SHEARS 8MM DA VINCI (MISCELLANEOUS) ×1
DERMABOND ADVANCED (GAUZE/BANDAGES/DRESSINGS) ×1
DERMABOND ADVANCED .7 DNX12 (GAUZE/BANDAGES/DRESSINGS) ×1 IMPLANT
DRAPE ARM DVNC X/XI (DISPOSABLE) ×4 IMPLANT
DRAPE COLUMN DVNC XI (DISPOSABLE) ×1 IMPLANT
DRAPE DA VINCI XI ARM (DISPOSABLE) ×4
DRAPE DA VINCI XI COLUMN (DISPOSABLE) ×1
DRAPE SHEET LG 3/4 BI-LAMINATE (DRAPES) ×4 IMPLANT
DRAPE SURG IRRIG POUCH 19X23 (DRAPES) ×2 IMPLANT
DRSG TEGADERM 4X4.75 (GAUZE/BANDAGES/DRESSINGS) ×2 IMPLANT
ELECT REM PT RETURN 15FT ADLT (MISCELLANEOUS) ×2 IMPLANT
GLOVE BIO SURGEON STRL SZ 6 (GLOVE) ×8 IMPLANT
GLOVE BIO SURGEON STRL SZ 6.5 (GLOVE) ×4 IMPLANT
GOWN STRL REUS W/ TWL LRG LVL3 (GOWN DISPOSABLE) ×2 IMPLANT
GOWN STRL REUS W/TWL LRG LVL3 (GOWN DISPOSABLE) ×2
HOLDER FOLEY CATH W/STRAP (MISCELLANEOUS) ×2 IMPLANT
IRRIG SUCT STRYKERFLOW 2 WTIP (MISCELLANEOUS) ×2
IRRIGATION SUCT STRKRFLW 2 WTP (MISCELLANEOUS) ×1 IMPLANT
KIT BASIN OR (CUSTOM PROCEDURE TRAY) ×2 IMPLANT
KIT PROCEDURE DA VINCI SI (MISCELLANEOUS)
KIT PROCEDURE DVNC SI (MISCELLANEOUS) IMPLANT
MANIPULATOR UTERINE 4.5 ZUMI (MISCELLANEOUS) ×2 IMPLANT
MARKER SKIN DUAL TIP RULER LAB (MISCELLANEOUS) ×2 IMPLANT
NDL SAFETY ECLIPSE 18X1.5 (NEEDLE) IMPLANT
NEEDLE HYPO 18GX1.5 SHARP (NEEDLE)
NEEDLE SPNL 18GX3.5 QUINCKE PK (NEEDLE) IMPLANT
OBTURATOR OPTICAL STANDARD 8MM (TROCAR) ×1
OBTURATOR OPTICAL STND 8 DVNC (TROCAR) ×1
OBTURATOR OPTICALSTD 8 DVNC (TROCAR) ×1 IMPLANT
OCCLUDER COLPOPNEUMO (BALLOONS) ×2 IMPLANT
PAD POSITIONING PINK XL (MISCELLANEOUS) ×2 IMPLANT
PORT ACCESS TROCAR AIRSEAL 12 (TROCAR) ×1 IMPLANT
PORT ACCESS TROCAR AIRSEAL 5M (TROCAR) ×1
POUCH SPECIMEN RETRIEVAL 10MM (ENDOMECHANICALS) IMPLANT
SEAL CANN UNIV 5-8 DVNC XI (MISCELLANEOUS) ×4 IMPLANT
SEAL XI 5MM-8MM UNIVERSAL (MISCELLANEOUS) ×4
SET TRI-LUMEN FLTR TB AIRSEAL (TUBING) ×2 IMPLANT
SHEET LAVH (DRAPES) ×2 IMPLANT
SOLUTION ELECTROLUBE (MISCELLANEOUS) ×2 IMPLANT
SURGIFLO W/THROMBIN 8M KIT (HEMOSTASIS) IMPLANT
SUT MNCRL AB 4-0 PS2 18 (SUTURE) ×4 IMPLANT
SUT VIC AB 0 CT1 27 (SUTURE) ×2
SUT VIC AB 0 CT1 27XBRD ANTBC (SUTURE) ×2 IMPLANT
SYR 10ML LL (SYRINGE) ×2 IMPLANT
SYR 50ML LL SCALE MARK (SYRINGE) ×2 IMPLANT
TOWEL OR 17X26 10 PK STRL BLUE (TOWEL DISPOSABLE) ×4 IMPLANT
TOWEL OR NON WOVEN STRL DISP B (DISPOSABLE) ×2 IMPLANT
TRAP SPECIMEN MUCOUS 40CC (MISCELLANEOUS) IMPLANT
TRAY FOLEY W/METER SILVER 16FR (SET/KITS/TRAYS/PACK) ×2 IMPLANT
TRAY LAPAROSCOPIC (CUSTOM PROCEDURE TRAY) ×2 IMPLANT
TROCAR BLADELESS OPT 5 100 (ENDOMECHANICALS) IMPLANT
UNDERPAD 30X30 (UNDERPADS AND DIAPERS) ×4 IMPLANT
WATER STERILE IRR 1500ML POUR (IV SOLUTION) IMPLANT

## 2017-02-22 NOTE — Anesthesia Preprocedure Evaluation (Addendum)
Anesthesia Evaluation  Patient identified by MRN, date of birth, ID band Patient awake    Reviewed: Allergy & Precautions, NPO status , Patient's Chart, lab work & pertinent test results  Airway Mallampati: II  TM Distance: >3 FB Neck ROM: Full    Dental no notable dental hx.    Pulmonary neg pulmonary ROS,    Pulmonary exam normal breath sounds clear to auscultation       Cardiovascular hypertension, Normal cardiovascular exam Rhythm:Regular Rate:Normal     Neuro/Psych MS  Neuromuscular disease negative psych ROS   GI/Hepatic negative GI ROS, Neg liver ROS,   Endo/Other  negative endocrine ROS  Renal/GU negative Renal ROS  negative genitourinary   Musculoskeletal negative musculoskeletal ROS (+)   Abdominal   Peds negative pediatric ROS (+)  Hematology negative hematology ROS (+)   Anesthesia Other Findings   Reproductive/Obstetrics negative OB ROS                             Anesthesia Physical Anesthesia Plan  ASA: II  Anesthesia Plan: General   Post-op Pain Management:    Induction: Intravenous  Airway Management Planned:   Additional Equipment:   Intra-op Plan:   Post-operative Plan: Extubation in OR  Informed Consent: I have reviewed the patients History and Physical, chart, labs and discussed the procedure including the risks, benefits and alternatives for the proposed anesthesia with the patient or authorized representative who has indicated his/her understanding and acceptance.   Dental advisory given  Plan Discussed with: CRNA and Surgeon  Anesthesia Plan Comments:         Anesthesia Quick Evaluation

## 2017-02-22 NOTE — Interval H&P Note (Signed)
History and Physical Interval Note:  02/22/2017 1:08 PM  Beverly Beltran  has presented today for surgery, with the diagnosis of CERVICAL CANCER  The various methods of treatment have been discussed with the patient and family. After consideration of risks, benefits and other options for treatment, the patient has consented to  Procedure(s): XI ROBOTIC ASSISTED TOTAL HYSTERECTOMY WITH BILATERAL SALPINGO OOPHORECTOMY (Bilateral) as a surgical intervention .  The patient's history has been reviewed, patient examined, no change in status, stable for surgery.  I have reviewed the patient's chart and labs.  Questions were answered to the patient's satisfaction.     Donaciano Eva

## 2017-02-22 NOTE — Op Note (Signed)
OPERATIVE NOTE 02/22/17  Surgeon: Donaciano Eva   Assistants: Dr Lahoma Crocker (an MD assistant was necessary for tissue manipulation, management of robotic instrumentation, retraction and positioning due to the complexity of the case and hospital policies).   Anesthesia: General endotracheal anesthesia  ASA Class: 3   Pre-operative Diagnosis: stage IA 1 cervical cancer  Post-operative Diagnosis: same  Operation: Robotic-assisted laparoscopic total hysterectomy with bilateral salpingectomy   Surgeon: Donaciano Eva  Assistant Surgeon: Lahoma Crocker MD  Anesthesia: GET  Urine Output:   200cc  Operative Findings:  : 8cm normal appearing uterus,   Normal appearing cervix.  Estimated Blood Loss:  less than 50 mL      Total IV Fluids: 500 ml         Specimens: uterus, cervix, bilateral tubes.         Complications:  None; patient tolerated the procedure well.         Disposition: PACU - hemodynamically stable.  Procedure Details  The patient was seen in the Holding Room. The risks, benefits, complications, treatment options, and expected outcomes were discussed with the patient.  The patient concurred with the proposed plan, giving informed consent.  The site of surgery properly noted/marked. The patient was identified as Beverly Beltran and the procedure verified as a Robotic-assisted hysterectomy with bilateral salpingo oophorectomy. A Time Out was held and the above information confirmed.  After induction of anesthesia, the patient was draped and prepped in the usual sterile manner. Pt was placed in supine position after anesthesia and draped and prepped in the usual sterile manner. The abdominal drape was placed after the CholoraPrep had been allowed to dry for 3 minutes.  Her arms were tucked to her side with all appropriate precautions.  The shoulders were stabilized with padded shoulder blocks applied to the acromium processes.  The patient was placed in  the semi-lithotomy position in [place].  The perineum was prepped with Betadine. The patient was then prepped. Foley catheter was placed.  A sterile speculum was placed in the vagina.  The cervix was grasped with a single-tooth tenaculum and dilated with Kennon Rounds dilators.  The ZUMI uterine manipulator with a medium colpotomizer ring was placed without difficulty.  A pneum occluder balloon was placed over the manipulator.  OG tube placement was confirmed and to suction.   Next, a 5 mm skin incision was made 1 cm below the subcostal margin in the midclavicular line.  The 5 mm Optiview port and scope was used for direct entry.  Opening pressure was under 10 mm CO2.  The abdomen was insufflated and the findings were noted as above.   At this point and all points during the procedure, the patient's intra-abdominal pressure did not exceed 15 mmHg. Next, a 10 mm skin incision was made in the umbilicus and a right and left port was placed about 10 cm lateral to the robot port on the right and left side.  All ports were placed under direct visualization.  The patient was placed in steep Trendelenburg.  Bowel was folded away into the upper abdomen.  The robot was docked in the normal manner.  The hysterectomy was started after the round ligament on the right side was incised and the retroperitoneum was entered and the pararectal space was developed.  The ureter was noted to be on the medial leaf of the broad ligament.  The peritoneum above the ureter was incised and stretched and the utero-ovarian ligament was skeletonized, cauterized and  cut after the fallopian tube was separated from the ovary using bipolar cautery and sharp dissection.  The posterior peritoneum was taken down to the level of the KOH ring.  The anterior peritoneum was also taken down.  The bladder flap was created to the level of the KOH ring.  The uterine artery on the right side was skeletonized, cauterized and cut in the normal manner.  A  similar procedure was performed on the left.  The colpotomy was made and the uterus, cervix, bilateral tubes were amputated and delivered through the vagina.  Pedicles were inspected and excellent hemostasis was achieved.    The colpotomy at the vaginal cuff was closed with Vicryl on a CT1 needle in a running manner.  Irrigation was used and excellent hemostasis was achieved.  At this point in the procedure was completed.  Robotic instruments were removed under direct visulaization.  The robot was undocked. The 10 mm ports were closed with Vicryl on a UR-5 needle and the fascia was closed with 0 Vicryl on a UR-5 needle.  The skin was closed with 4-0 Vicryl in a subcuticular manner.  Dermabond was applied.  Sponge, lap and needle counts correct x 2.  The patient was taken to the recovery room in stable condition.  The vagina was swabbed with  minimal bleeding noted.   All instrument and needle counts were correct x  3.   The patient was transferred to the recovery room in a stable condition.  Donaciano Eva, MD

## 2017-02-22 NOTE — Transfer of Care (Signed)
Immediate Anesthesia Transfer of Care Note  Patient: Beverly Beltran  Procedure(s) Performed: Procedure(s): XI ROBOTIC ASSISTED TOTAL HYSTERECTOMY WITH BILATERAL SALPINGECTOMY (Bilateral)  Patient Location: PACU  Anesthesia Type:General  Level of Consciousness:  sedated, patient cooperative and responds to stimulation  Airway & Oxygen Therapy:Patient Spontanous Breathing and Patient connected to face mask oxgen  Post-op Assessment:  Report given to PACU RN and Post -op Vital signs reviewed and stable  Post vital signs:  Reviewed and stable  Last Vitals:  Vitals:   02/22/17 1148  BP: 135/88  Pulse: 85  Resp: 18  Temp: 76.8 C    Complications: No apparent anesthesia complications

## 2017-02-22 NOTE — Anesthesia Procedure Notes (Addendum)
Procedure Name: Intubation Date/Time: 02/22/2017 1:38 PM Performed by: Claudia Desanctis Pre-anesthesia Checklist: Patient identified, Emergency Drugs available, Suction available and Patient being monitored Patient Re-evaluated:Patient Re-evaluated prior to inductionOxygen Delivery Method: Circle system utilized Preoxygenation: Pre-oxygenation with 100% oxygen Intubation Type: IV induction Ventilation: Mask ventilation without difficulty Laryngoscope Size: Mac and 3 Grade View: Grade II Tube type: Oral Number of attempts: 1 Airway Equipment and Method: Stylet Placement Confirmation: ETT inserted through vocal cords under direct vision,  positive ETCO2 and breath sounds checked- equal and bilateral Secured at: 22 cm Tube secured with: Tape Dental Injury: Teeth and Oropharynx as per pre-operative assessment

## 2017-02-22 NOTE — H&P (View-Only) (Signed)
Consult Note: Gyn-Onc  Consult was requested by Dr. Talbert Nan for the evaluation of Beverly Beltran 46 y.o. Beltran  CC:  Chief Complaint  Patient presents with  . CIN-lll    early microinvasive disease of cervix    Assessment/Plan:  Beverly Beltran  is a 46 y.o.  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  I explained to Beverly Beltran that her cervical cancer would be classified as stage IA1 or stage 0 (CINIII). However, there is an increased risk for endocervical recurrence given the postive margins for CIN III, and close margin for possible microinvasive disease. Additionally, there is the potential for occult invasive carcinoma beyond the endocervical margin.   I discussed with Beverly Beltran that there are 3 options for intervention. 1/ close follow-up with repeat pap and endocervical assessment at 6 monthly intervals x 2 years. If these are normal, she can return to triage and schedule of paps and HPV testing in accordance with ASCCP guidelines. 2/ repeat cervical excisional procedure (I would recommend cold-knife conization to ensure better endocervical sampling). I feel that she has adequate cervical stroma and length to tolerate this procedure. I discussed that there is less recovery time involved with this procedure than more invasive procedures.  3/ hysterectomy with salpingectomy. I discussed that this would defnitively resect any endocervical disease that was present right now, but is not a treatment for high risk HPV, and therefore, 1/3 to 1/2 of patients who have hysterectomy for dysplasia or malignancy develop subsequent vaginal dysplasia in the future. I discussed that she would therefore continue to need close surveillance after hysterectomy for persistence or recurrence of dysplasia. I discussed that hysterectomy is associated with more surgical risks and longer recovery than repeat cervical excisional procedure. I discussed  that it would result in permanent sterility (of note, the patient requires birth control due to her diagnosis of MS).  The patient is electing for either option 2 (cone bx) or 3 (hysterectomy). She will think things over with her husband and let us know when she decides.   HPI: Beverly Beltran is a 46 year old P2 who is seen in consultation at the request of Dr Talbert Nan for CIN III/IA1 microinvasive cervical cancer.  The patient received a pap smear in February, 2018 which was ASCUS, but positive for high risk HPV. She had not been tested for HPV prior to this year, and this was her first abnormal result. In work up for the abnormal pap she underwent colposcopic biopsies which confirmed CIN III on 01/05/17. She then received a LEEP procedure on 01/11/17 with a separate 12 o'clock and 6 o'clock LEEP specimen.  The endocervical curette was benign, however, the LEEP from 12 o'clock showed CIN III with a small focus "suspicious for invasive carcinoma. The high grade dysplastic process is seen at or immediately adjacent to the endocervical margin of the resection. In this background there is a small focus (1-54m) highly suspicious for superficially invasive carcinoma as seen on multiple levels.  This is also seen at the endocervical margin of resection." CIN I was present at the ectocervical margin.  [place] 01[place]17 showed a uterus measuring 7.8x4.9x4.1cm. The ovaries were normal.   She has been diagnosed with multiple sclerosis in the past year.She has no residual deficits from her initial flare, other than fatigue. She has had no prior abdominal surgeries and has had 2 prior SVD's. She has completed childbearing and was contemplating a Mirena IUD to provide  birth control after being diagnosed with MS.  Current Meds:  Outpatient Encounter Prescriptions as of 01/31/2017  Medication Sig  . BIOTIN 5000 PO Take 1 capsule by mouth daily.  . cetirizine (ZYRTEC) 10 MG tablet Take 10 mg by mouth daily.  .  cholecalciferol (VITAMIN D) 1000 units tablet Take 1,000 Units by mouth daily.  . Cyanocobalamin (VITAMIN B 12 PO) Take 1 tablet by mouth daily.   . cyclobenzaprine (FLEXERIL) 5 MG tablet Take 1 tablet (5 mg total) by mouth at bedtime.  . fluticasone (FLONASE ALLERGY RELIEF) 50 MCG/ACT nasal spray Place 1 spray into both nostrils daily.  . Lactobacillus (PROBIOTIC ACIDOPHILUS PO) Take 1 capsule by mouth daily.  Marland Kitchen lisinopril-hydrochlorothiazide (PRINZIDE,ZESTORETIC) 20-12.5 MG tablet Take 1 tablet by mouth daily.  Marland Kitchen MAGNESIUM PO Take by mouth.  . meloxicam (MOBIC) 7.5 MG tablet Take 1 tablet (7.5 mg total) by mouth daily as needed for pain.  . naproxen sodium (ANAPROX) 220 MG tablet Take 220 mg by mouth at bedtime.  . Nutritional Supplements (MULTIPLE SCLEROSIS SUPPORT PO) Take 2 capsules by mouth 2 times daily at 12 noon and 4 pm.  . pravastatin (PRAVACHOL) 20 MG tablet Take 20 mg by mouth daily.  Marland Kitchen PRESCRIPTION MEDICATION Research medication for MS ALKS 8700 2 capsules twice a day.  . sertraline (ZOLOFT) 50 MG tablet TAKE 1/2 TAB FOR 1 WEEK, THEN 1 TAB ONCE A DAY ORALLY 90 DAYS  . vitamin C (ASCORBIC ACID) 500 MG tablet Take 500 mg by mouth daily.  . [DISCONTINUED] montelukast (SINGULAIR) 10 MG tablet Take 10 mg by mouth at bedtime.   No facility-administered encounter medications on file as of 01/31/2017.     Allergy: No Known Allergies  Social Hx:   Social History   Social History  . Marital status: Married    Spouse name: N/A  . Number of children: N/A  . Years of education: N/A   Occupational History  . Not on file.   Social History Main Topics  . Smoking status: Never Smoker  . Smokeless tobacco: Never Used  . Alcohol use No  . Drug use: No  . Sexual activity: Yes    Birth control/ protection: Diaphragm   Other Topics Concern  . Not on file   Social History Narrative  . No narrative on file    Past Surgical Hx:  Past Surgical History:  Procedure Laterality Date   . DILATION AND CURETTAGE OF UTERUS  1994    Past Medical Hx:  Past Medical History:  Diagnosis Date  . Anxiety   . Depression   . Genital warts   . Hypercholesteremia   . Hypertension   . Multiple sclerosis ([place]) 01/2016  . Vision abnormalities     Past Gynecological History:  SVD x 2. High risk HPV detected February 2018. No LMP recorded.  Family Hx:  Family History  Problem Relation Age of Onset  . Diabetes Mellitus II Mother   . COPD Father     Review of Systems:  Constitutional  Feels well,   +fatigue ENT Normal appearing ears and nares bilaterally Skin/Breast  No rash, sores, jaundice, itching, dryness Cardiovascular  No chest pain, shortness of breath, or edema  Pulmonary  No cough or wheeze.  Gastro Intestinal  No nausea, vomitting, or diarrhoea. No bright red blood per rectum, no abdominal pain, change in bowel movement, or constipation.  Genito Urinary  No frequency, urgency, dysuria, no bleeding or irregular menses Musculo Skeletal  No myalgia, arthralgia,  joint swelling or pain  Neurologic  No weakness, numbness, change in gait,  Psychology  No depression, anxiety, insomnia.   Vitals:  Blood pressure 118/80, pulse 70, temperature 98.3 F (36.8 C), temperature source Oral, resp. rate 18, height 5' 3"  (1.6 m), weight 215 lb 6.4 oz (97.7 kg).  Physical Exam: WD in NAD Neck  Supple NROM, without any enlargements.  Lymph Node Survey No cervical supraclavicular or inguinal adenopathy Cardiovascular  Pulse normal rate, regularity and rhythm. S1 and S2 normal.  Lungs  Clear to auscultation bilateraly, without wheezes/crackles/rhonchi. Good air movement.  Skin  No rash/lesions/breakdown  Psychiatry  Alert and oriented to person, place, and time  Abdomen  Normoactive bowel sounds, abdomen soft, non-tender and obese without evidence of hernia.  Back No CVA tenderness Genito Urinary  Vulva/vagina: Normal external Beltran genitalia.  No lesions. No  discharge or bleeding.  Bladder/urethra:  No lesions or masses, well supported bladder  Vagina: norma  Cervix: Normal appearing, no lesions. LEEP site healing well. Adequate residual cervical length and stroma  Uterus:  Retroverted, small, mobile, no parametrial involvement or nodularity.  Adnexa: no palpable masses. Rectal  deferred  Extremities  No bilateral cyanosis, clubbing or edema.   Donaciano Eva, MD  01/31/2017, 11:36 AM

## 2017-02-22 NOTE — Anesthesia Postprocedure Evaluation (Signed)
Anesthesia Post Note  Patient: Beverly Beltran  Procedure(s) Performed: Procedure(s) (LRB): XI ROBOTIC ASSISTED TOTAL HYSTERECTOMY WITH BILATERAL SALPINGECTOMY (Bilateral)  Patient location during evaluation: PACU Anesthesia Type: General Level of consciousness: awake and alert Pain management: pain level controlled Vital Signs Assessment: post-procedure vital signs reviewed and stable Respiratory status: spontaneous breathing, nonlabored ventilation, respiratory function stable and patient connected to nasal cannula oxygen Cardiovascular status: blood pressure returned to baseline and stable Postop Assessment: no signs of nausea or vomiting Anesthetic complications: no       Last Vitals:  Vitals:   02/22/17 1600 02/22/17 1615  BP: 121/74 125/80  Pulse: 82 95  Resp: 14 16  Temp:      Last Pain:  Vitals:   02/22/17 1620  TempSrc:   PainSc: 1                  Kevin Mario S

## 2017-02-22 NOTE — Discharge Instructions (Signed)
02/22/2017  Return to work: 4 weeks  Activity: 1. Be up and out of the bed during the day.  Take a nap if needed.  You may walk up steps but be careful and use the hand rail.  Stair climbing will tire you more than you think, you may need to stop part way and rest.   2. No lifting or straining for 6 weeks.  3. No driving for 1 weeks.  Do Not drive if you are taking narcotic pain medicine.  4. Shower daily.  Use soap and water on your incision and pat dry; don't rub.   5. No sexual activity and nothing in the vagina for 8 weeks.  Medications:  - Take ibuprofen and tylenol first line for pain control. Take these regularly (every 6 hours) to decrease the build up of pain.  - If necessary, for severe pain not relieved by ibuprofen, take percocet.  - While taking percocet you should take sennakot every night to reduce the likelihood of constipation. If this causes diarrhea, stop its use.  Diet: 1. Low sodium Heart Healthy Diet is recommended.  2. It is safe to use a laxative if you have difficulty moving your bowels.   Wound Care: 1. Keep clean and dry.  Shower daily.  Reasons to call the Doctor:   Fever - Oral temperature greater than 100.4 degrees Fahrenheit  Foul-smelling vaginal discharge  Difficulty urinating  Nausea and vomiting  Increased pain at the site of the incision that is unrelieved with pain medicine.  Difficulty breathing with or without chest pain  New calf pain especially if only on one side  Sudden, continuing increased vaginal bleeding with or without clots.   Follow-up: 1. See Everitt Amber in 3 weeks.  Contacts: For questions or concerns you should contact:  Dr. Everitt Amber at [phone] After hours and on week-ends call [phone] and ask to speak to the physician on call for Gynecologic Oncology

## 2017-02-23 ENCOUNTER — Encounter (HOSPITAL_COMMUNITY): Payer: Self-pay | Admitting: Gynecologic Oncology

## 2017-02-23 ENCOUNTER — Telehealth: Payer: Self-pay | Admitting: *Deleted

## 2017-02-23 DIAGNOSIS — C539 Malignant neoplasm of cervix uteri, unspecified: Secondary | ICD-10-CM | POA: Diagnosis not present

## 2017-02-23 LAB — CBC
HEMATOCRIT: 39.3 % (ref 36.0–46.0)
Hemoglobin: 13.2 g/dL (ref 12.0–15.0)
MCH: 27 pg (ref 26.0–34.0)
MCHC: 33.6 g/dL (ref 30.0–36.0)
MCV: 80.5 fL (ref 78.0–100.0)
Platelets: 274 10*3/uL (ref 150–400)
RBC: 4.88 MIL/uL (ref 3.87–5.11)
RDW: 13.5 % (ref 11.5–15.5)
WBC: 9.7 10*3/uL (ref 4.0–10.5)

## 2017-02-23 LAB — BASIC METABOLIC PANEL
ANION GAP: 7 (ref 5–15)
BUN: 10 mg/dL (ref 6–20)
CHLORIDE: 100 mmol/L — AB (ref 101–111)
CO2: 27 mmol/L (ref 22–32)
Calcium: 9.3 mg/dL (ref 8.9–10.3)
Creatinine, Ser: 0.72 mg/dL (ref 0.44–1.00)
GFR calc Af Amer: >60 mL/min (ref >60)
GFR calc non Af Amer: >60 mL/min (ref >60)
GLUCOSE: 154 mg/dL — AB (ref 65–99)
POTASSIUM: 4.4 mmol/L (ref 3.5–5.1)
Sodium: 134 mmol/L — ABNORMAL LOW (ref 135–145)

## 2017-02-23 MED ORDER — OXYCODONE-ACETAMINOPHEN 5-325 MG PO TABS: 1.0000 | ORAL_TABLET | ORAL | 0 refills | Status: DC | PRN

## 2017-02-23 MED ORDER — SENNA 8.6 MG PO TABS: 1.0000 | ORAL_TABLET | Freq: Every day | ORAL | 0 refills | Status: DC

## 2017-02-23 MED ORDER — IBUPROFEN 600 MG PO TABS: 600.0000 mg | ORAL_TABLET | Freq: Four times a day (QID) | ORAL | 0 refills | Status: AC | PRN

## 2017-02-23 MED ORDER — MELOXICAM 7.5 MG PO TABS: 7.5000 mg | ORAL_TABLET | Freq: Every day | ORAL | 1 refills | Status: DC | PRN

## 2017-02-23 NOTE — Progress Notes (Signed)
Discharge planning, no HH needs identified. [phone]

## 2017-02-23 NOTE — Telephone Encounter (Signed)
90 day Meloxicam rx. escribed to [facility] per faxed request/fim

## 2017-02-23 NOTE — Discharge Summary (Signed)
Physician Discharge Summary  Patient ID: Beverly Beltran MRN: [id number] DOB/AGE: Dec 27, 1970 46 y.o.  Admit date: 02/22/2017 Discharge date: 02/23/2017  Admission Diagnoses: Cervical cancer, FIGO stage IA1 [facility])  Discharge Diagnoses:  Principal Problem:   Cervical cancer, FIGO stage IA1 ([place]) Active Problems:   Cervical cancer [facility])   Discharged Condition: good  Hospital Course:  1/ patient was admitted on 02/22/17 for a robotic hysterectomy and bilateral salpingectomy for stage IA1 cervical cancer. 2/ surgery was uncomplicated  3/ on postoperative day 1 the patient was meeting discharge criteria: tolerating PO, voiding urine, ambulating, pain well controlled on oral medications.  4/ new medications on discharge include ibuprofen 614m, sennakot and percocet.   Consults: None  Significant Diagnostic Studies: labs:  CBC    Component Value Date/Time   WBC 9.7 02/23/2017 0426   RBC 4.88 02/23/2017 0426   HGB 13.2 02/23/2017 0426   HCT 39.3 02/23/2017 0426   PLT 274 02/23/2017 0426   MCV 80.5 02/23/2017 0426   MCH 27.0 02/23/2017 0426   MCHC 33.6 02/23/2017 0426   RDW 13.5 02/23/2017 0426   LYMPHSABS 1.7 02/18/2017 1351   MONOABS 0.5 02/18/2017 1351   EOSABS 0.2 02/18/2017 1351   BASOSABS 0.0 02/18/2017 1351    Treatments: surgery: see above  Discharge Exam: Blood pressure (!) 111/58, pulse 84, temperature 97.8 F (36.6 C), temperature source Oral, resp. rate 16, height 5' 3.5" (1.613 m), weight 216 lb (98 kg), last menstrual period 02/14/2017, SpO2 98 %. General appearance: alert GI: soft, non-tender; bowel sounds normal; no masses,  no organomegaly Incision/Wound: clean, dry and intact with dermabond x 4  Disposition: 01-Home or Self Care  Discharge Instructions    (HEART FAILURE PATIENTS) Call MD:  Anytime you have any of the following symptoms: 1) 3 pound weight gain in 24 hours or 5 pounds in 1 week 2) shortness of breath, with or without a dry hacking cough 3)  swelling in the hands, feet or stomach 4) if you have to sleep on extra pillows at night in order to breathe.    Complete by:  As directed    Call MD for:  difficulty breathing, headache or visual disturbances    Complete by:  As directed    Call MD for:  extreme fatigue    Complete by:  As directed    Call MD for:  hives    Complete by:  As directed    Call MD for:  persistant dizziness or light-headedness    Complete by:  As directed    Call MD for:  persistant nausea and vomiting    Complete by:  As directed    Call MD for:  redness, tenderness, or signs of infection (pain, swelling, redness, odor or green/yellow discharge around incision site)    Complete by:  As directed    Call MD for:  severe uncontrolled pain    Complete by:  As directed    Call MD for:  temperature >100.4    Complete by:  As directed    Diet - low sodium heart healthy    Complete by:  As directed    Diet general    Complete by:  As directed    Driving Restrictions    Complete by:  As directed    No driving for 7 days or until off narcotic pain medication   Increase activity slowly    Complete by:  As directed    Remove dressing in 24 hours  Complete by:  As directed    Sexual Activity Restrictions    Complete by:  As directed    No intercourse for 6 weeks     Allergies as of 02/23/2017   No Known Allergies     Medication List    TAKE these medications   Biotin 10000 MCG Tabs Take 10,000 mcg by mouth daily.   cetirizine 10 MG tablet Commonly known as:  ZYRTEC Take 10 mg by mouth at bedtime.   cholecalciferol 1000 units tablet Commonly known as:  VITAMIN D Take 1,000 Units by mouth daily.   cyclobenzaprine 5 MG tablet Commonly known as:  FLEXERIL Take 1 tablet (5 mg total) by mouth at bedtime.   FLONASE ALLERGY RELIEF 50 MCG/ACT nasal spray Generic drug:  fluticasone Place 1 spray into both nostrils daily.   ibuprofen 200 MG tablet Commonly known as:  ADVIL,MOTRIN Take 400 mg by  mouth every 8 (eight) hours as needed (for pain/headaches). What changed:  Another medication with the same name was added. Make sure you understand how and when to take each.   ibuprofen 600 MG tablet Commonly known as:  ADVIL,MOTRIN Take 1 tablet (600 mg total) by mouth every 6 (six) hours as needed. What changed:  You were already taking a medication with the same name, and this prescription was added. Make sure you understand how and when to take each.   Investigational - Study Medication Take 462 mg by mouth 2 (two) times daily. Study name:ALKS 8700 for Multiple Sclerosis (231 MG CAPSULES) Additional study details:prescribed by Arlice Colt MD with  [facility] [phone]   lisinopril-hydrochlorothiazide 20-12.5 MG tablet Commonly known as:  PRINZIDE,ZESTORETIC Take 1 tablet by mouth daily.   Magnesium 500 MG Tabs Take 500 mg by mouth at bedtime.   meloxicam 7.5 MG tablet Commonly known as:  MOBIC Take 1 tablet (7.5 mg total) by mouth daily as needed for pain. What changed:  when to take this   oxyCODONE-acetaminophen 5-325 MG tablet Commonly known as:  PERCOCET/ROXICET Take 1-2 tablets by mouth every 4 (four) hours as needed (moderate to severe pain (when tolerating fluids)).   pravastatin 20 MG tablet Commonly known as:  PRAVACHOL Take 20 mg by mouth at bedtime.   PROBIOTIC ACIDOPHILUS PO Take 1 capsule by mouth at bedtime. 1 BILLION CFUs   senna 8.6 MG Tabs tablet Commonly known as:  SENOKOT Take 1 tablet (8.6 mg total) by mouth at bedtime.   sertraline 50 MG tablet Commonly known as:  ZOLOFT TAKE 1 TABLET (50 MG) BY MOUTH DAILY AT NIGHT.   Vitamin B-12 2500 MCG Subl Place 2,500 mcg under the tongue daily.        Signed: Donaciano Eva 02/23/2017, 8:53 AM

## 2017-02-23 NOTE — Telephone Encounter (Signed)
I have scheduled the post op appt, it will be on the discharge summary for the patient

## 2017-02-25 ENCOUNTER — Telehealth: Payer: Self-pay | Admitting: Gynecologic Oncology

## 2017-02-25 NOTE — Telephone Encounter (Signed)
Post op telephone call to check patient status.  Patient describes expected post operative status.  Adequate PO intake reported.  Bowels and bladder functioning without difficulty.  Pain minimal.  Reportable signs and symptoms reviewed.  Follow up appt given.  Final path discussed.

## 2017-03-01 ENCOUNTER — Telehealth: Payer: Self-pay

## 2017-03-01 NOTE — Telephone Encounter (Signed)
Told Ms Matthew the results of the pathology results as noted below by Dr. Denman George.

## 2017-03-01 NOTE — Telephone Encounter (Signed)
-----   Message from Everitt Amber, MD sent at 02/28/2017 12:02 PM EDT ----- Would you mind letting Ms Lindo know that her pathology showed no residual dysplasia in the specimen. No additional treatment is necessary at this time. However, I am recommending annual follow-up with pap and HPV testing for at least 20 years with Dr Talbert Nan.  Donaciano Eva, MD

## 2017-03-09 DIAGNOSIS — M109 Gout, unspecified: Secondary | ICD-10-CM | POA: Diagnosis not present

## 2017-03-09 DIAGNOSIS — M1990 Unspecified osteoarthritis, unspecified site: Secondary | ICD-10-CM | POA: Diagnosis not present

## 2017-03-16 ENCOUNTER — Encounter: Payer: Self-pay | Admitting: Gynecologic Oncology

## 2017-03-16 ENCOUNTER — Ambulatory Visit: Payer: 59 | Attending: Gynecologic Oncology | Admitting: Gynecologic Oncology

## 2017-03-16 VITALS — BP 122/78 | HR 96 | Temp 98.7°F | Resp 20 | Wt 215.8 lb

## 2017-03-16 DIAGNOSIS — Z9889 Other specified postprocedural states: Secondary | ICD-10-CM | POA: Diagnosis not present

## 2017-03-16 DIAGNOSIS — Z90710 Acquired absence of both cervix and uterus: Secondary | ICD-10-CM | POA: Diagnosis not present

## 2017-03-16 DIAGNOSIS — Z9079 Acquired absence of other genital organ(s): Secondary | ICD-10-CM | POA: Insufficient documentation

## 2017-03-16 DIAGNOSIS — Z86001 Personal history of in-situ neoplasm of cervix uteri: Secondary | ICD-10-CM | POA: Diagnosis not present

## 2017-03-16 DIAGNOSIS — Z90722 Acquired absence of ovaries, bilateral: Secondary | ICD-10-CM | POA: Insufficient documentation

## 2017-03-16 DIAGNOSIS — C531 Malignant neoplasm of exocervix: Secondary | ICD-10-CM

## 2017-03-16 DIAGNOSIS — C539 Malignant neoplasm of cervix uteri, unspecified: Secondary | ICD-10-CM

## 2017-03-16 NOTE — Progress Notes (Signed)
POSTOPERATIVE FOLLOW-UP  Assessment:    46 y.o. year old with history of CIN3 vs stage IA1 cervical cancer.   S/p robotic hysterectomy and bilateral salpingectomy on 02/22/17.   Plan: 1) Pathology reports reviewed today 2) Treatment counseling - I discussed that she is at risk for recurrent vaginal dysplasia which, if undetected and untreated can progress to vaginal cancer.Therefore, in accordance with ASCCP guidelines, she should continue to have vaginal cytology/HPV assessments for 20 years post hysterectomy. She was given the opportunity to ask questions, which were answered to her satisfaction, and she is agreement with the above mentioned plan of care.  3)  Return to clinic on a prn basis. She should return to see Dr Talbert Nan in 12 months.  HPI:  Beverly Beltran is a 46 y.o. year old G3P2012 initially seen in consultation on 01/31/17 referred by Dr Talbert Nan for CIN3 and possible early microinvasive cervical cancer.  She then underwent a robotic hysterectomy bilateral salpingectomy on 11/24/76 without complications.  Her postoperative course was uncomplicated.  Her final pathology revealed no residual dysplasia.  She is seen today for a postoperative check and to discuss her pathology results and ongoing plan.  Since discharge from the hospital, she is feeling well.  She has improving appetite, normal bowel and bladder function, and pain controlled with minimal PO medication. She has no other complaints today.    Review of systems: Constitutional:  She has no weight gain or weight loss. She has no fever or chills. Eyes: No blurred vision Ears, Nose, Mouth, Throat: No dizziness, headaches or changes in hearing. No mouth sores. Cardiovascular: No chest pain, palpitations or edema. Respiratory:  No shortness of breath, wheezing or cough Gastrointestinal: She has normal bowel movements without diarrhea or constipation. She denies any nausea or vomiting. She denies blood in her stool or heart  burn. Genitourinary:  She denies pelvic pain, pelvic pressure or changes in her urinary function. She has no hematuria, dysuria, or incontinence. She has no irregular vaginal bleeding or vaginal discharge Musculoskeletal: Denies muscle weakness or joint pains.  Skin:  She has no skin changes, rashes or itching Neurological:  Denies dizziness or headaches. No neuropathy, no numbness or tingling. Psychiatric:  She denies depression or anxiety. Hematologic/Lymphatic:   No easy bruising or bleeding   Physical Exam: Blood pressure 122/78, pulse 96, temperature 98.7 F (37.1 C), temperature source Oral, resp. rate 20, weight 215 lb 12.8 oz (97.9 kg), last menstrual period 02/14/2017. General: Well dressed, well nourished in no apparent distress.   HEENT:  Normocephalic and atraumatic, no lesions.  Extraocular muscles intact. Sclerae anicteric. Pupils equal, round, reactive. No mouth sores or ulcers. Thyroid is normal size, not nodular, midline. Abdomen:  Soft, nontender, nondistended.  No palpable masses.  No hepatosplenomegaly.  No ascites. Normal bowel sounds.  No hernias.  Incisions are well healed Genitourinary: Normal EGBUS  Vaginal cuff intact.  No bleeding or discharge.  No cul de sac fullness. Extremities: No cyanosis, clubbing or edema.  No calf tenderness or erythema. No palpable cords. Psychiatric: Mood and affect are appropriate. Neurological: Awake, alert and oriented x 3. Sensation is intact, no neuropathy.  Musculoskeletal: No pain, normal strength and range of motion.  Donaciano Eva, MD

## 2017-03-16 NOTE — Patient Instructions (Signed)
Follow up with Dr. Talbert Nan in one year.  Please call for any questions or concerns.

## 2017-03-21 NOTE — Addendum Note (Signed)
Addendum  created 03/21/17 1430 by Myrtie Soman, MD   Sign clinical note

## 2017-03-21 NOTE — Anesthesia Postprocedure Evaluation (Signed)
Anesthesia Post Note  Patient: Beverly Beltran  Procedure(s) Performed: Procedure(s) (LRB): XI ROBOTIC ASSISTED TOTAL HYSTERECTOMY WITH BILATERAL SALPINGECTOMY (Bilateral)     Anesthesia Post Evaluation  Last Vitals:  Vitals:   02/23/17 0543 02/23/17 0945  BP: (!) 111/58 131/64  Pulse: 84 86  Resp: 16 16  Temp: 36.6 C 36.7 C    Last Pain:  Vitals:   02/23/17 0945  TempSrc: Oral  PainSc:                  Doug Bucklin S

## 2017-03-23 DIAGNOSIS — M25571 Pain in right ankle and joints of right foot: Secondary | ICD-10-CM | POA: Diagnosis not present

## 2017-03-23 DIAGNOSIS — M79671 Pain in right foot: Secondary | ICD-10-CM | POA: Diagnosis not present

## 2017-04-25 ENCOUNTER — Other Ambulatory Visit: Payer: Self-pay | Admitting: Neurology

## 2017-04-25 DIAGNOSIS — G35 Multiple sclerosis: Secondary | ICD-10-CM

## 2017-05-04 ENCOUNTER — Ambulatory Visit: Payer: Self-pay | Admitting: Neurology

## 2017-05-10 ENCOUNTER — Ambulatory Visit (INDEPENDENT_AMBULATORY_CARE_PROVIDER_SITE_OTHER): Payer: Self-pay | Admitting: Neurology

## 2017-05-10 DIAGNOSIS — Z0289 Encounter for other administrative examinations: Secondary | ICD-10-CM

## 2017-05-11 ENCOUNTER — Ambulatory Visit: Payer: Self-pay | Admitting: Neurology

## 2017-05-11 ENCOUNTER — Ambulatory Visit
Admission: RE | Admit: 2017-05-11 | Discharge: 2017-05-11 | Disposition: A | Discharge status: Home / Self Care | Payer: No Typology Code available for payment source | Source: Ambulatory Visit | Attending: Neurology | Admitting: Neurology

## 2017-05-11 DIAGNOSIS — G35 Multiple sclerosis: Secondary | ICD-10-CM

## 2017-07-21 ENCOUNTER — Other Ambulatory Visit: Payer: Self-pay | Admitting: Neurology

## 2017-08-01 ENCOUNTER — Ambulatory Visit: Payer: Self-pay | Admitting: Neurology

## 2017-08-03 ENCOUNTER — Ambulatory Visit: Payer: Self-pay | Admitting: Neurology

## 2017-08-06 DIAGNOSIS — Z23 Encounter for immunization: Secondary | ICD-10-CM | POA: Diagnosis not present

## 2017-08-22 DIAGNOSIS — R7303 Prediabetes: Secondary | ICD-10-CM | POA: Diagnosis not present

## 2017-08-22 DIAGNOSIS — Z5181 Encounter for therapeutic drug level monitoring: Secondary | ICD-10-CM | POA: Diagnosis not present

## 2017-08-22 DIAGNOSIS — E785 Hyperlipidemia, unspecified: Secondary | ICD-10-CM | POA: Diagnosis not present

## 2017-08-22 DIAGNOSIS — Z0000 Encounter for general adult medical examination without abnormal findings: Secondary | ICD-10-CM | POA: Diagnosis not present

## 2017-10-27 ENCOUNTER — Ambulatory Visit: Payer: Self-pay | Admitting: Neurology

## 2017-10-31 ENCOUNTER — Ambulatory Visit: Payer: Self-pay | Admitting: Neurology

## 2017-12-14 ENCOUNTER — Other Ambulatory Visit: Payer: Self-pay

## 2017-12-14 ENCOUNTER — Ambulatory Visit
Admission: RE | Admit: 2017-12-14 | Discharge: 2017-12-14 | Disposition: A | Discharge status: Home / Self Care | Payer: 59 | Source: Ambulatory Visit | Attending: Obstetrics and Gynecology | Admitting: Obstetrics and Gynecology

## 2017-12-14 ENCOUNTER — Ambulatory Visit: Payer: 59 | Admitting: Obstetrics and Gynecology

## 2017-12-14 ENCOUNTER — Encounter: Payer: Self-pay | Admitting: Obstetrics and Gynecology

## 2017-12-14 ENCOUNTER — Other Ambulatory Visit (HOSPITAL_COMMUNITY)
Admission: RE | Admit: 2017-12-14 | Discharge: 2017-12-14 | Disposition: A | Discharge status: Home / Self Care | Payer: 59 | Source: Ambulatory Visit | Attending: Obstetrics and Gynecology | Admitting: Obstetrics and Gynecology

## 2017-12-14 ENCOUNTER — Other Ambulatory Visit: Payer: Self-pay | Admitting: Obstetrics and Gynecology

## 2017-12-14 VITALS — BP 138/80 | HR 76 | Resp 16 | Ht 63.25 in | Wt 229.0 lb

## 2017-12-14 DIAGNOSIS — Z8541 Personal history of malignant neoplasm of cervix uteri: Secondary | ICD-10-CM | POA: Diagnosis not present

## 2017-12-14 DIAGNOSIS — Z01419 Encounter for gynecological examination (general) (routine) without abnormal findings: Secondary | ICD-10-CM

## 2017-12-14 DIAGNOSIS — Z139 Encounter for screening, unspecified: Secondary | ICD-10-CM

## 2017-12-14 DIAGNOSIS — Z1272 Encounter for screening for malignant neoplasm of vagina: Secondary | ICD-10-CM

## 2017-12-14 DIAGNOSIS — N393 Stress incontinence (female) (male): Secondary | ICD-10-CM

## 2017-12-14 DIAGNOSIS — N898 Other specified noninflammatory disorders of vagina: Secondary | ICD-10-CM

## 2017-12-14 DIAGNOSIS — Z1231 Encounter for screening mammogram for malignant neoplasm of breast: Secondary | ICD-10-CM | POA: Diagnosis not present

## 2017-12-14 LAB — POCT URINALYSIS DIPSTICK
BILIRUBIN UA: NEGATIVE
Glucose, UA: NEGATIVE
Ketones, UA: NEGATIVE
Leukocytes, UA: NEGATIVE
Nitrite, UA: NEGATIVE
PH UA: 5 (ref 5.0–8.0)
Protein, UA: NEGATIVE
RBC UA: NEGATIVE

## 2017-12-14 NOTE — Patient Instructions (Signed)
Breast Self-Awareness Breast self-awareness means being familiar with how your breasts look and feel. It involves checking your breasts regularly and reporting any changes to your health care provider. Practicing breast self-awareness is important. A change in your breasts can be a sign of a serious medical problem. Being familiar with how your breasts look and feel allows you to find any problems early, when treatment is more likely to be successful. All women should practice breast self-awareness, including women who have had breast implants. How to do a breast self-exam One way to learn what is normal for your breasts and whether your breasts are changing is to do a breast self-exam. To do a breast self-exam: Look for Changes  1. Remove all the clothing above your waist. 2. Stand in front of a mirror in a room with good lighting. 3. Put your hands on your hips. 4. Push your hands firmly downward. 5. Compare your breasts in the mirror. Look for differences between them (asymmetry), such as: ? Differences in shape. ? Differences in size. ? Puckers, dips, and bumps in one breast and not the other. 6. Look at each breast for changes in your skin, such as: ? Redness. ? Scaly areas. 7. Look for changes in your nipples, such as: ? Discharge. ? Bleeding. ? Dimpling. ? Redness. ? A change in position. Feel for Changes  Carefully feel your breasts for lumps and changes. It is best to do this while lying on your back on the floor and again while sitting or standing in the shower or tub with soapy water on your skin. Feel each breast in the following way:  Place the arm on the side of the breast you are examining above your head.  Feel your breast with the other hand.  Start in the nipple area and make  inch (2 cm) overlapping circles to feel your breast. Use the pads of your three middle fingers to do this. Apply light pressure, then medium pressure, then firm pressure. The light pressure  will allow you to feel the tissue closest to the skin. The medium pressure will allow you to feel the tissue that is a little deeper. The firm pressure will allow you to feel the tissue close to the ribs.  Continue the overlapping circles, moving downward over the breast until you feel your ribs below your breast.  Move one finger-width toward the center of the body. Continue to use the  inch (2 cm) overlapping circles to feel your breast as you move slowly up toward your collarbone.  Continue the up and down exam using all three pressures until you reach your armpit.  Write Down What You Find  Write down what is normal for each breast and any changes that you find. Keep a written record with breast changes or normal findings for each breast. By writing this information down, you do not need to depend only on memory for size, tenderness, or location. Write down where you are in your menstrual cycle, if you are still menstruating. If you are having trouble noticing differences in your breasts, do not get discouraged. With time you will become more familiar with the variations in your breasts and more comfortable with the exam. How often should I examine my breasts? Examine your breasts every month. If you are breastfeeding, the best time to examine your breasts is after a feeding or after using a breast pump. If you menstruate, the best time to examine your breasts is 5-7 days after your  period is over. During your period, your breasts are lumpier, and it may be more difficult to notice changes. When should I see my health care provider? See your health care provider if you notice:  A change in shape or size of your breasts or nipples.  A change in the skin of your breast or nipples, such as a reddened or scaly area.  Unusual discharge from your nipples.  A lump or thick area that was not there before.  Pain in your breasts.  Anything that concerns you.  This information is not intended  to replace advice given to you by your health care provider. Make sure you discuss any questions you have with your health care provider. Document Released: 10/04/2005 Document Revised: 03/11/2016 Document Reviewed: 08/24/2015 Elsevier Interactive Patient Education  2018 [employer]. Kegel Exercises Kegel exercises help strengthen the muscles that support the rectum, vagina, small intestine, bladder, and uterus. Doing Kegel exercises can help:  Improve bladder and bowel control.  Improve sexual response.  Reduce problems and discomfort during pregnancy.  Kegel exercises involve squeezing your pelvic floor muscles, which are the same muscles you squeeze when you try to stop the flow of urine. The exercises can be done while sitting, standing, or lying down, but it is best to vary your position. Phase 1 exercises 1. Squeeze your pelvic floor muscles tight. You should feel a tight lift in your rectal area. If you are a female, you should also feel a tightness in your vaginal area. Keep your stomach, buttocks, and legs relaxed. 2. Hold the muscles tight for up to 10 seconds. 3. Relax your muscles. Repeat this exercise 50 times a day or as many times as told by your health care provider. Continue to do this exercise for at least 4-6 weeks or for as long as told by your health care provider. This information is not intended to replace advice given to you by your health care provider. Make sure you discuss any questions you have with your health care provider. Document Released: 09/20/2012 Document Revised: 05/29/2016 Document Reviewed: 08/24/2015 Elsevier Interactive Patient Education  2018 [employer] AND DIET:  We recommended that you start or continue a regular exercise program for good health. Regular exercise means any activity that makes your heart beat faster and makes you sweat.  We recommend exercising at least 30 minutes per day at least 3 days a week, preferably 4 or 5.  We  also recommend a diet low in fat and sugar.  Inactivity, poor dietary choices and obesity can cause diabetes, heart attack, stroke, and kidney damage, among others.    ALCOHOL AND SMOKING:  Women should limit their alcohol intake to no more than 7 drinks/beers/glasses of wine (combined, not each!) per week. Moderation of alcohol intake to this level decreases your risk of breast cancer and liver damage. And of course, no recreational drugs are part of a healthy lifestyle.  And absolutely no smoking or even second hand smoke. Most people know smoking can cause heart and lung diseases, but did you know it also contributes to weakening of your bones? Aging of your skin?  Yellowing of your teeth and nails?  CALCIUM AND VITAMIN D:  Adequate intake of calcium and Vitamin D are recommended.  The recommendations for exact amounts of these supplements seem to change often, but generally speaking 600 mg of calcium (either carbonate or citrate) and 800 units of Vitamin D per day seems prudent. Certain women may benefit from  higher intake of Vitamin D.  If you are among these women, your doctor will have told you during your visit.    PAP SMEARS:  Pap smears, to check for cervical cancer or precancers,  have traditionally been done yearly, although recent scientific advances have shown that most women can have pap smears less often.  However, every woman still should have a physical exam from her gynecologist every year. It will include a breast check, inspection of the vulva and vagina to check for abnormal growths or skin changes, a visual exam of the cervix, and then an exam to evaluate the size and shape of the uterus and ovaries.  And after 46 years of age, a rectal exam is indicated to check for rectal cancers. We will also provide age appropriate advice regarding health maintenance, like when you should have certain vaccines, screening for sexually transmitted diseases, bone density testing, colonoscopy,  mammograms, etc.   MAMMOGRAMS:  All women over 79 years old should have a yearly mammogram. Many facilities now offer a "3D" mammogram, which may cost around $50 extra out of pocket. If possible,  we recommend you accept the option to have the 3D mammogram performed.  It both reduces the number of women who will be called back for extra views which then turn out to be normal, and it is better than the routine mammogram at detecting truly abnormal areas.    COLONOSCOPY:  Colonoscopy to screen for colon cancer is recommended for all women at age 74.  We know, you hate the idea of the prep.  We agree, BUT, having colon cancer and not knowing it is worse!!  Colon cancer so often starts as a polyp that can be seen and removed at colonscopy, which can quite literally save your life!  And if your first colonoscopy is normal and you have no family history of colon cancer, most women don't have to have it again for 10 years.  Once every ten years, you can do something that may end up saving your life, right?  We will be happy to help you get it scheduled when you are ready.  Be sure to check your insurance coverage so you understand how much it will cost.  It may be covered as a preventative service at no cost, but you should check your particular policy.

## 2017-12-14 NOTE — Progress Notes (Signed)
47 y.o. [id number] MarriedCaucasianF here for annual exam.   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 She then had a hysterectomy with Dr Denman George. Final pathology was stage 1A1 cervical cancer. No vaginal bleeding, no dyspareunia. No major medical changes.  She c/o GSI, with coughing and sneezing. She leaks a small amount daily, wears a liner. This has worsened in the last 3-4 months. No urinary urgency, frequency, no urge incontinence.     Patient's last menstrual period was 02/14/2017.          Sexually active: Yes.    The current method of family planning is status post hysterectomy.    Exercising: No.  The patient does not participate in regular exercise at present. Smoker:  no  Health Maintenance: Pap:  PAP 12-08-16 ASCUS + HR HPV colposcopy CIN III LEEP suspiciuous for invasive cervical cancer- sent to GYN oncology - had hysterectomy  History of abnormal Pap:  yes MMG: 2015 in [place]- Normal per patient   Colonoscopy:  Never BMD:   Never TDaP:  2014 Gardasil: N/A   reports that  has never smoked. she has never used smokeless tobacco. She reports that she does not drink alcohol or use drugs. Homemaker, kids are 53 and 69 (son and daughter).   Past Medical History:  Diagnosis Date  . Anxiety   . Cancer ([place]) 01/2017   cervical cancer   . Depression   . Genital warts   . Hypercholesteremia   . Hypertension   . Multiple sclerosis ([place]) 01/2016  . Numbness    occassional numbnes in in extremities  . Vision abnormalities     Past Surgical History:  Procedure Laterality Date  . CERVICAL BIOPSY  W/ LOOP ELECTRODE EXCISION    . COLPOSCOPY    . DILATION AND CURETTAGE OF UTERUS  1994  . ROBOTIC ASSISTED TOTAL HYSTERECTOMY WITH BILATERAL SALPINGO OOPHERECTOMY Bilateral 02/22/2017   Procedure: XI ROBOTIC ASSISTED TOTAL HYSTERECTOMY WITH BILATERAL SALPINGECTOMY;  Surgeon: Everitt Amber, MD;  Location: [facility];  Service: Gynecology;   Laterality: Bilateral;    Current Outpatient Medications  Medication Sig Dispense Refill  . Biotin 10000 MCG TABS Take 10,000 mcg by mouth daily.    . cetirizine (ZYRTEC) 10 MG tablet Take 10 mg by mouth at bedtime.     . cholecalciferol (VITAMIN D) 1000 units tablet Take 1,000 Units by mouth daily.    . Cyanocobalamin (VITAMIN B-12) 2500 MCG SUBL Place 2,500 mcg under the tongue daily.    . cyclobenzaprine (FLEXERIL) 5 MG tablet TAKE 1 TABLET BY MOUTH EVERY DAY AT BEDTIME 30 tablet 11  . fluticasone (FLONASE ALLERGY RELIEF) 50 MCG/ACT nasal spray Place 1 spray into both nostrils daily.    Marland Kitchen ibuprofen (ADVIL,MOTRIN) 200 MG tablet Take 400 mg by mouth every 8 (eight) hours as needed (for pain/headaches).    Marland Kitchen ibuprofen (ADVIL,MOTRIN) 600 MG tablet Take 1 tablet (600 mg total) by mouth every 6 (six) hours as needed. 30 tablet 0  . Investigational - Study Medication Take 462 mg by mouth 2 (two) times daily. Study name:ALKS 8700 for Multiple Sclerosis (231 MG CAPSULES) Additional study details:prescribed by Arlice Colt MD with  [facility] [phone]    . Lactobacillus (PROBIOTIC ACIDOPHILUS PO) Take 1 capsule by mouth at bedtime. 1 BILLION CFUs    . lisinopril-hydrochlorothiazide (PRINZIDE,ZESTORETIC) 20-12.5 MG tablet Take 1 tablet by mouth daily.    . Magnesium 500 MG TABS  Take 500 mg by mouth at bedtime.    . meloxicam (MOBIC) 7.5 MG tablet Take 1 tablet (7.5 mg total) by mouth daily as needed for pain. 90 tablet 1  . pravastatin (PRAVACHOL) 20 MG tablet Take 20 mg by mouth at bedtime.   3  . sertraline (ZOLOFT) 50 MG tablet TAKE 1 TABLET (50 MG) BY MOUTH DAILY AT NIGHT.  3   No current facility-administered medications for this visit.     Family History  Problem Relation Age of Onset  . Diabetes Mellitus II Mother   . COPD Father     Review of Systems  Constitutional: Positive for unexpected weight change.  HENT: Negative.   Eyes: Negative.   Respiratory:  Negative.   Cardiovascular: Negative.   Gastrointestinal: Negative.   Endocrine: Positive for cold intolerance and heat intolerance.  Genitourinary: Negative.        Increased Loss of urine with cough  Musculoskeletal: Negative.   Skin: Negative.   Allergic/Immunologic: Negative.   Neurological: Negative.   Psychiatric/Behavioral: Negative.     Exam:   BP 138/80 (BP Location: Right Arm, Patient Position: Sitting, Cuff Size: Normal)   Pulse 76   Resp 16   Ht 5' 3.25" (1.607 m)   Wt 229 lb (103.9 kg)   LMP 02/14/2017   BMI 40.25 kg/m   Weight change: @WEIGHTCHANGE @ Height:   Height: 5' 3.25" (160.7 cm)  Ht Readings from Last 3 Encounters:  12/14/17 5' 3.25" (1.607 m)  02/22/17 5' 3.5" (1.613 m)  02/18/17 5' 3.5" (1.613 m)    General appearance: alert, cooperative and appears stated age Head: Normocephalic, without obvious abnormality, atraumatic Neck: no adenopathy, supple, symmetrical, trachea midline and thyroid normal to inspection and palpation Lungs: clear to auscultation bilaterally Cardiovascular: regular rate and rhythm Breasts: normal appearance, no masses or tenderness Abdomen: soft, non-tender; non distended,  no masses,  no organomegaly Extremities: extremities normal, atraumatic, no cyanosis or edema Skin: Skin color, texture, turgor normal. No rashes or lesions Lymph nodes: Cervical, supraclavicular, and axillary nodes normal. No abnormal inguinal nodes palpated Neurologic: Grossly normal   Pelvic: External genitalia:  no lesions              Urethra:  normal appearing urethra with no masses, tenderness or lesions              Bartholins and Skenes: normal                 Vagina: normal appearing vagina with normal color. <1 cm area of granulation tissue at the left vaginal cuff. After the pap it was treated with silver nitrate. Friable with pap              Cervix: absent               Bimanual Exam:  Uterus:  uterus absent              Adnexa: no mass,  fullness, tenderness               Rectovaginal: Confirms               Anus:  normal sphincter tone, no lesions  Chaperone was present for exam.  A:  Well Woman with normal exam  H/O cervical cancer  GSI  Granulation tissue at the vaginal cuff  P:   Pap with hpv (will need pap's for 20 years post hysterectomy)  Labs with primary  Mammogram over due  Discussed breast  self exam  Discussed calcium and vit D intake  Kegel information given, call if she is interested in PT  Not currently interested in surgery for GSI  Urine for ua, c&s

## 2017-12-15 LAB — URINALYSIS, MICROSCOPIC ONLY

## 2017-12-15 LAB — URINE CULTURE

## 2017-12-16 LAB — CYTOLOGY - PAP
DIAGNOSIS: NEGATIVE
HPV: NOT DETECTED

## 2018-01-30 ENCOUNTER — Encounter: Payer: Self-pay | Admitting: Neurology

## 2018-01-30 ENCOUNTER — Ambulatory Visit: Payer: Self-pay | Admitting: Neurology

## 2018-02-03 ENCOUNTER — Other Ambulatory Visit: Payer: Self-pay | Admitting: Neurology

## 2018-04-11 ENCOUNTER — Other Ambulatory Visit: Payer: Self-pay | Admitting: Neurology

## 2018-04-11 DIAGNOSIS — G35 Multiple sclerosis: Secondary | ICD-10-CM

## 2018-04-18 ENCOUNTER — Ambulatory Visit
Admission: RE | Admit: 2018-04-18 | Discharge: 2018-04-18 | Disposition: A | Discharge status: Home / Self Care | Payer: No Typology Code available for payment source | Source: Ambulatory Visit | Attending: Neurology | Admitting: Neurology

## 2018-04-18 DIAGNOSIS — G35 Multiple sclerosis: Secondary | ICD-10-CM

## 2018-04-18 MED ORDER — GADOTERIDOL 279.3 MG/ML IV SOLN: 19.0000 mL | Freq: Once | INTRAVENOUS | Status: DC | PRN

## 2018-04-18 MED ORDER — GADOBENATE DIMEGLUMINE 529 MG/ML IV SOLN
19.0000 mL | Freq: Once | INTRAVENOUS | Status: AC | PRN
  Administered 2018-04-18: 19 mL via INTRAVENOUS

## 2018-04-24 ENCOUNTER — Telehealth: Payer: Self-pay | Admitting: *Deleted

## 2018-04-24 NOTE — Telephone Encounter (Signed)
I have spoken with Lolitha and reviewed below MRI results.  She verbalized understanding of same/fim

## 2018-04-24 NOTE — Telephone Encounter (Signed)
-----   Message from Britt Bottom, MD sent at 04/20/2018  5:39 PM EDT ----- Please let her know that the MRI of the brain did not show any new lesions.

## 2018-04-26 ENCOUNTER — Encounter: Payer: Self-pay | Admitting: *Deleted

## 2018-04-26 ENCOUNTER — Ambulatory Visit (INDEPENDENT_AMBULATORY_CARE_PROVIDER_SITE_OTHER): Payer: 59 | Admitting: Neurology

## 2018-04-26 DIAGNOSIS — G35 Multiple sclerosis: Secondary | ICD-10-CM

## 2018-04-26 NOTE — Progress Notes (Signed)
She is here today for her last study visit.  She has done very well on ALKS-8700.   Therefore, she would like to switch to Tecfidera as those medications are very similar.  She signed a service request form.  Today, her EDSS score is 2.0 (2 on visual, one on cerebellar and one on cerebral).  She will return to see me in about 4 months to make sure that she is transitioning well to the Tecfidera.

## 2018-05-02 ENCOUNTER — Other Ambulatory Visit: Payer: Self-pay | Admitting: Neurology

## 2018-05-04 ENCOUNTER — Telehealth: Payer: Self-pay | Admitting: Neurology

## 2018-05-04 NOTE — Telephone Encounter (Signed)
PA for Tecfidera completed by phone with [facility] phone# [phone].  Dx: RRMS (G35). Dx. with MS 02/23/16 and has been in a research study for MS, has been receiving meds thru the study (ALKS-8700), so does not have other tried and failed meds.  PA# [id number]

## 2018-05-04 NOTE — Telephone Encounter (Signed)
Pt said tecfidera needs PA to resent. Per [facility] need to resubmit PA because 1st one failed.

## 2018-05-05 NOTE — Telephone Encounter (Signed)
Fax received from [facility] approved for dates 05/04/18 thru 05/04/20.  PA# [employer] [id number] WB/fim

## 2018-05-10 ENCOUNTER — Ambulatory Visit: Payer: Self-pay | Admitting: Neurology

## 2018-05-10 ENCOUNTER — Ambulatory Visit (INDEPENDENT_AMBULATORY_CARE_PROVIDER_SITE_OTHER): Payer: 59 | Admitting: Neurology

## 2018-05-10 DIAGNOSIS — Z0289 Encounter for other administrative examinations: Secondary | ICD-10-CM

## 2018-05-15 ENCOUNTER — Ambulatory Visit: Payer: Self-pay | Admitting: Neurology

## 2018-06-29 DIAGNOSIS — J069 Acute upper respiratory infection, unspecified: Secondary | ICD-10-CM | POA: Diagnosis not present

## 2018-06-29 DIAGNOSIS — R05 Cough: Secondary | ICD-10-CM | POA: Diagnosis not present

## 2018-07-25 ENCOUNTER — Other Ambulatory Visit: Payer: Self-pay | Admitting: Neurology

## 2018-08-01 ENCOUNTER — Other Ambulatory Visit: Payer: Self-pay | Admitting: Neurology

## 2018-08-01 DIAGNOSIS — Z23 Encounter for immunization: Secondary | ICD-10-CM | POA: Diagnosis not present

## 2018-08-11 DIAGNOSIS — I10 Essential (primary) hypertension: Secondary | ICD-10-CM | POA: Diagnosis not present

## 2018-08-29 ENCOUNTER — Ambulatory Visit: Payer: 59 | Admitting: Neurology

## 2018-09-06 ENCOUNTER — Encounter: Payer: Self-pay | Admitting: Neurology

## 2018-09-06 ENCOUNTER — Ambulatory Visit: Payer: 59 | Admitting: Neurology

## 2018-09-06 VITALS — BP 120/77 | HR 75 | Ht 63.0 in | Wt 231.5 lb

## 2018-09-06 DIAGNOSIS — C531 Malignant neoplasm of exocervix: Secondary | ICD-10-CM | POA: Diagnosis not present

## 2018-09-06 DIAGNOSIS — Z0000 Encounter for general adult medical examination without abnormal findings: Secondary | ICD-10-CM | POA: Diagnosis not present

## 2018-09-06 DIAGNOSIS — R7303 Prediabetes: Secondary | ICD-10-CM | POA: Diagnosis not present

## 2018-09-06 DIAGNOSIS — R5383 Other fatigue: Secondary | ICD-10-CM

## 2018-09-06 DIAGNOSIS — Z79899 Other long term (current) drug therapy: Secondary | ICD-10-CM | POA: Diagnosis not present

## 2018-09-06 DIAGNOSIS — G4700 Insomnia, unspecified: Secondary | ICD-10-CM

## 2018-09-06 DIAGNOSIS — I10 Essential (primary) hypertension: Secondary | ICD-10-CM | POA: Diagnosis not present

## 2018-09-06 DIAGNOSIS — M1990 Unspecified osteoarthritis, unspecified site: Secondary | ICD-10-CM | POA: Diagnosis not present

## 2018-09-06 DIAGNOSIS — G35 Multiple sclerosis: Secondary | ICD-10-CM | POA: Diagnosis not present

## 2018-09-06 DIAGNOSIS — E785 Hyperlipidemia, unspecified: Secondary | ICD-10-CM | POA: Diagnosis not present

## 2018-09-06 NOTE — Progress Notes (Signed)
p  [facility]  PATIENT: Beverly Beltran DOB: Feb 15, 1971  REFERRING DOCTOR OR PCP:  Maurice Small SOURCE: Patient, ED and hospital notes in [place], labs and imaging reports in Epic, personal review of MRI images on PACS.  _________________________________   HISTORICAL  CHIEF COMPLAINT:  Chief Complaint  Patient presents with  . Follow-up    RM 12, alone. Last seen 04/26/18.   . Multiple Sclerosis    Pt on Tecfidera. Tolerating well. Has to take ASA very rare when she gets itchy from medication.    HISTORY OF PRESENT ILLNESS:  Beverly Beltran is a 47 y.o. woman with MS.  Update 09/06/2018: She feels her MS has been stable.    She tolerates Tecfidera well   She has some flushing twice a month and it resolves with aspirin.  She has rare GERD but no other GI issue.     Her last MRI 04/19/2018 did not show any lesions.  Gait and strength are fine.   Balance and coordination are fine.   Sh will sometimes have some mild hand numbness but this is intermittent, often position related.    She has some urinary urgency and rare stress incontinence.   Vision is doing well.   She has new contacts.   She notes some difficulty with reading, better with mild readers.    She feels her fatigue is mild and tolerable.   She has more in the afternoons.   She has sleep onset insomnia but sleeps well once asleep.   Mood is doing well on sertraline.   Cognition is fine.   She had a hysterectomy May 2018 for cervical cancer FIGO Stage IA1  From 02/22/2016:   Around 01/28/2016, she had the onset of slurred speech and also felt more fatigue. Additionally, there was a dysesthetic numbness going into the left arm towards the thumb. She notes that she had just moved into a new house and had done quite a bit of activity over the preceding couple of weeks when symptoms did not improve after couple days, she went to the emergency room. There, she was admitted. The initial CT scan was normal but MRI of  the brain was consistent with MS. MRI of the spine did not show any MS lesions but did show significant degenerative changes at C5-C6 as well as T7-T8, T8-T9 and T9-T10..   She received 5 days of steroids and was discharged home.  By the third or fourth day of IV steroids, symptoms were much better.   She was discharged after 5 days of steroids.  Gait/strength/sensation: She denies any difficulty with gait. She could climb a ladder if she wanted to. She denies any significant difficulty with strength or sensation at this time.  She still gets some dysesthetic sensation down the left arm towards the thumb at times. This can occur with sitting and not necessarily with changes in position.  Bladder/bowel: She notes some urinary frequency and urgency but is not having incontinence. There is no hesitancy. She has nocturia x 2.  She does note some bowel constipation. Both of these issues have occurred for several years at least.  Vision: She notes that several years ago when she was in [place] she had visual blurring and was noted to have an abnormality and was having a procedure that sounds like OCT every 6 months to measure the nerve fibers.  Fatigue/sleep: She does note quite a bit of fatigue that is worse as the day goes on and worse  with heat. She has some excessive daytime sleepiness and would doze off if she is resting and sometimes will watching TV and other activities. She notes insomnia with more difficulty falling than staying asleep.    Mood/cognition: She is on sertraline 50 mg for anxiety more than for depression.   This has been beneficial to her. She currently denies any cognitive issues but noted difficulty coming out with the right words in the midst of her exacerbation last month. This has improved.  I personally reviewed the CT scan from 02/03/2016, MRI from 02/03/2016 of the brain and spine and the contrasted MRI of the brain from 02/04/2016 on PACS. MRI of the brain shows an enhancing  large focus in the left frontal lobe, is also hyperintense on diffusion-weighted images. There are several other T2/FLAIR hyperintense foci in the periventricular, deep and juxtacortical white matter. The MRI of the cervical spine shows degenerative changes at C5-C6 causing severe right and moderate left foraminal narrowing that could lead to right C6 nerve root compression there are milder degenerative changes at C3-C4 and C4-C5 with less potential for nerve root impingement.  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 The spinal cord had normal signal.  Notes from her emergency room and hospital visits from 02/03/2016 through 02/09/2016 were reviewed. She presented with several days of slurred speech and left arm numbness. He received IV Solu-Medrol and was discharged for 02/09/2016.   She had elevated glucose while on steroids.  REVIEW OF SYSTEMS: Constitutional: No fevers, chills, sweats, or change in appetite   she notes fatigue, insomnia and daytime hypersomnia Eyes: No visual changes, double vision, eye pain Ear, nose and throat: No hearing loss, ear pain, nasal congestion, sore throat Cardiovascular: No chest pain, palpitations Respiratory: No shortness of breath at rest or with exertion.   No wheezes.   She snores GastrointestinaI: No nausea, vomiting, diarrhea, abdominal pain, fecal incontinence.  Some constipation Genitourinary: No dysuria, urinary retention.  She has frequency and nocturia. Musculoskeletal: No neck pain, back pain Integumentary: No rash, pruritus, skin lesions Neurological: as above Psychiatric: No depression at this time.  No anxiety now (had in past) Endocrine: No palpitations, diaphoresis, change in appetite, change in weigh or increased thirst Hematologic/Lymphatic: No anemia, purpura, petechiae. Allergic/Immunologic: No itchy/runny eyes, nasal  congestion, recent allergic reactions, rashes  ALLERGIES: No Known Allergies  HOME MEDICATIONS:  Current Outpatient Medications:  .  amLODIPine-Valsartan-HCTZ 10-320-25 MG TABS, Take 1 tablet by mouth daily., Disp: , Rfl:  .  cetirizine (ZYRTEC) 10 MG tablet, Take 10 mg by mouth at bedtime. , Disp: , Rfl:  .  cholecalciferol (VITAMIN D) 1000 units tablet, Take 1,000 Units by mouth daily., Disp: , Rfl:  .  Cyanocobalamin (VITAMIN B-12) 2500 MCG SUBL, Place 2,500 mcg under the tongue daily., Disp: , Rfl:  .  cyclobenzaprine (FLEXERIL) 5 MG tablet, TAKE 1 TABLET BY MOUTH EVERY DAY AT BEDTIME, Disp: 30 tablet, Rfl: 11 .  Dimethyl Fumarate (TECFIDERA) 240 MG CPDR, Take by mouth., Disp: , Rfl:  .  fluticasone (FLONASE ALLERGY RELIEF) 50 MCG/ACT nasal spray, Place 1 spray into both nostrils daily., Disp: , Rfl:  .  ibuprofen (ADVIL,MOTRIN) 200 MG tablet, Take 400 mg by mouth every 8 (eight) hours as needed (for pain/headaches)., Disp: , Rfl:  .  ibuprofen (ADVIL,MOTRIN) 600 MG tablet, Take 1 tablet (600 mg total) by mouth every 6 (six)  hours as needed., Disp: 30 tablet, Rfl: 0 .  Lactobacillus (PROBIOTIC ACIDOPHILUS PO), Take 1 capsule by mouth at bedtime. 1 BILLION CFUs, Disp: , Rfl:  .  meloxicam (MOBIC) 7.5 MG tablet, TAKE 1 TABLET BY MOUTH EVERY DAY AS NEEDED FOR PAIN, Disp: 90 tablet, Rfl: 0 .  pravastatin (PRAVACHOL) 20 MG tablet, Take 20 mg by mouth at bedtime. , Disp: , Rfl: 3 .  sertraline (ZOLOFT) 50 MG tablet, TAKE 1 TABLET (50 MG) BY MOUTH DAILY AT NIGHT., Disp: , Rfl: 3  PAST MEDICAL HISTORY: Past Medical History:  Diagnosis Date  . Anxiety   . Cancer ([place]) 01/2017   cervical cancer   . Depression   . Genital warts   . Hypercholesteremia   . Hypertension   . Multiple sclerosis ([place]) 01/2016  . Multiple sclerosis ([place])   . Numbness    occassional numbnes in in extremities  . Vision abnormalities     PAST SURGICAL HISTORY: Past Surgical History:  Procedure Laterality Date   . ABDOMINAL HYSTERECTOMY    . CERVICAL BIOPSY  W/ LOOP ELECTRODE EXCISION    . COLPOSCOPY    . DILATION AND CURETTAGE OF UTERUS  1994  . ROBOTIC ASSISTED TOTAL HYSTERECTOMY WITH BILATERAL SALPINGO OOPHERECTOMY Bilateral 02/22/2017   Procedure: XI ROBOTIC ASSISTED TOTAL HYSTERECTOMY WITH BILATERAL SALPINGECTOMY;  Surgeon: Everitt Amber, MD;  Location: [facility];  Service: Gynecology;  Laterality: Bilateral;    FAMILY HISTORY: Family History  Problem Relation Age of Onset  . Diabetes Mellitus II Mother   . COPD Father     SOCIAL HISTORY:  Social History   Socioeconomic History  . Marital status: Married    Spouse name: Not on file  . Number of children: Not on file  . Years of education: Not on file  . Highest education level: Not on file  Occupational History  . Not on file  Social Needs  . Financial resource strain: Not on file  . Food insecurity:    Worry: Not on file    Inability: Not on file  . Transportation needs:    Medical: Not on file    Non-medical: Not on file  Tobacco Use  . Smoking status: Never Smoker  . Smokeless tobacco: Never Used  Substance and Sexual Activity  . Alcohol use: No  . Drug use: No  . Sexual activity: Yes    Partners: Male    Birth control/protection: Surgical    Comment: hysterectomy   Lifestyle  . Physical activity:    Days per week: Not on file    Minutes per session: Not on file  . Stress: Not on file  Relationships  . Social connections:    Talks on phone: Not on file    Gets together: Not on file    Attends religious service: Not on file    Active member of club or organization: Not on file    Attends meetings of clubs or organizations: Not on file    Relationship status: Not on file  . Intimate partner violence:    Fear of current or ex partner: Not on file    Emotionally abused: Not on file    Physically abused: Not on file    Forced sexual activity: Not on file  Other Topics Concern  . Not on file  Social History  Narrative  . Not on file     PHYSICAL EXAM  Vitals:   09/06/18 1556  BP: 120/77  Pulse: 75  Weight: 231  lb 8 oz (105 kg)  Height: 5' 3"  (1.6 m)    Body mass index is 41.01 kg/m.   General: The patient is well-developed and well-nourished and in no acute distress   Neurologic Exam  Mental status: The patient is alert and oriented x 3 at the time of the examination. The patient has apparent normal recent and remote memory, with an apparently normal attention span and concentration ability.   Speech is normal.  Cranial nerves: Extraocular movements are full.   Facial strength and sensation is normal.  Trapezius strength is normal..  The tongue is midline, and the patient has symmetric elevation of the soft palate. No obvious hearing deficits are noted.  Motor:  Muscle bulk is normal.   Tone is normal. Strength is  5 / 5 in all 4 extremities.   Sensory: Sensory testing is intact to pinprick, soft touch and vibration sensation in all 4 extremities.  Coordination: Cerebellar testing reveals good finger-nose-finger and heel-to-shin bilaterally.  Gait and station: Station is normal.   The gait is normal.  Tandem gait is mildly wide.  Romberg is negative.   Reflexes: Deep tendon reflexes are symmetric and normal bilaterally.   Plantar responses are flexor.    DIAGNOSTIC DATA (LABS, IMAGING, TESTING) - I reviewed patient records, labs, notes, testing and imaging myself where available.  Lab Results  Component Value Date   WBC 9.7 02/23/2017   HGB 13.2 02/23/2017   HCT 39.3 02/23/2017   MCV 80.5 02/23/2017   PLT 274 02/23/2017      Component Value Date/Time   NA 134 (L) 02/23/2017 0426   K 4.4 02/23/2017 0426   CL 100 (L) 02/23/2017 0426   CO2 27 02/23/2017 0426   GLUCOSE 154 (H) 02/23/2017 0426   BUN 10 02/23/2017 0426   CREATININE 0.72 02/23/2017 0426   CALCIUM 9.3 02/23/2017 0426   PROT 8.3 (H) 02/18/2017 1351   ALBUMIN 4.5 02/18/2017 1351   AST 26 02/18/2017  1351   ALT 33 02/18/2017 1351   ALKPHOS 55 02/18/2017 1351   BILITOT 0.4 02/18/2017 1351   GFRNONAA >60 02/23/2017 0426   GFRAA >60 02/23/2017 0426       ASSESSMENT AND PLAN  Multiple sclerosis (HCC) - Plan: CBC with Differential/Platelet  High risk medication use - Plan: CBC with Differential/Platelet  Malignant neoplasm of exocervix (HCC)  Other fatigue  Insomnia, unspecified type   1.  Continue Tecfidera.  We will check a CBC with differential today.  Next year we will check another MRI of the brain to determine if she is having subclinical progression and another disease modifying therapy would be considered if this is occurring. 2.   Continue cyclobenzaprine at night. 3.    Return in 6 months or sooner if there are new or worsening neurologic symptoms.  Richard A. Felecia Shelling, MD, PhD 03/54/6568, 1:27 PM Certified in Neurology, Clinical Neurophysiology, Sleep Medicine, Pain Medicine and Neuroimaging  [facility] [address] [place], [place] [address] [phone]

## 2018-09-07 ENCOUNTER — Telehealth: Payer: Self-pay | Admitting: *Deleted

## 2018-09-07 LAB — CBC WITH DIFFERENTIAL/PLATELET
BASOS ABS: 0 10*3/uL (ref 0.0–0.2)
Basos: 1 %
EOS (ABSOLUTE): 0.1 10*3/uL (ref 0.0–0.4)
Eos: 2 %
HEMOGLOBIN: 12.9 g/dL (ref 11.1–15.9)
Hematocrit: 39.8 % (ref 34.0–46.6)
IMMATURE GRANS (ABS): 0 10*3/uL (ref 0.0–0.1)
IMMATURE GRANULOCYTES: 0 %
Lymphocytes Absolute: 1.5 10*3/uL (ref 0.7–3.1)
Lymphs: 24 %
MCH: 25.6 pg — ABNORMAL LOW (ref 26.6–33.0)
MCHC: 32.4 g/dL (ref 31.5–35.7)
MCV: 79 fL (ref 79–97)
MONOCYTES: 6 %
Monocytes Absolute: 0.4 10*3/uL (ref 0.1–0.9)
NEUTROS ABS: 4.4 10*3/uL (ref 1.4–7.0)
Neutrophils: 67 %
Platelets: 339 10*3/uL (ref 150–450)
RBC: 5.03 x10E6/uL (ref 3.77–5.28)
RDW: 14 % (ref 12.3–15.4)
WBC: 6.5 10*3/uL (ref 3.4–10.8)

## 2018-09-07 NOTE — Telephone Encounter (Signed)
-----   Message from Britt Bottom, MD sent at 09/07/2018 12:40 PM EST ----- Please let the patient know that the lab work is fine.

## 2018-09-07 NOTE — Telephone Encounter (Signed)
Called and spoke with patient. Advised labwork looked fine per Dr. Felecia Shelling. She verbalized understanding.

## 2018-10-23 ENCOUNTER — Other Ambulatory Visit: Payer: Self-pay | Admitting: Neurology

## 2018-11-16 DIAGNOSIS — M109 Gout, unspecified: Secondary | ICD-10-CM | POA: Diagnosis not present

## 2018-11-30 DIAGNOSIS — M109 Gout, unspecified: Secondary | ICD-10-CM | POA: Diagnosis not present

## 2018-12-13 DIAGNOSIS — R05 Cough: Secondary | ICD-10-CM | POA: Diagnosis not present

## 2019-01-01 DIAGNOSIS — Z5181 Encounter for therapeutic drug level monitoring: Secondary | ICD-10-CM | POA: Diagnosis not present

## 2019-01-01 DIAGNOSIS — M10072 Idiopathic gout, left ankle and foot: Secondary | ICD-10-CM | POA: Diagnosis not present

## 2019-01-02 NOTE — Progress Notes (Signed)
48 y.o. [id number] Married White or Caucasian Not Hispanic or Latino female here for annual exam.   GSI is unchanged, leaks a small amount daily. Wears a pad. Tolerable. Not doing the [facility], knows how.  Sexually active, no pain. No vasomotor symptoms.      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 She then had a hysterectomy with Dr Denman George. Final pathology was stage 1A1 cervical cancer  No medical changes.   Patient's last menstrual period was 02/14/2017.          Sexually active: Yes.    The current method of family planning is post hysterectomy. Exercising: No.  The patient does not participate in regular exercise at present. Smoker:  no  Health Maintenance: Pap:  12/14/2017 WNL NEG HPV,  12-08-16 ASCUS + HR HPV colposcopy CIN III LEEP suspicious for invasive cervical cancer- sent to GYN oncology - had hysterectomy  History of abnormal Pap:  yes MMG: 12/14/2017 Bir[place] 1 negative  Colonoscopy:  Never BMD:   Never TDaP:  2014 Gardasil: N/A   reports that she has never smoked. She has never used smokeless tobacco. She reports that she does not drink alcohol or use drugs. Homemaker, kids are 75 and 13 (son and daughter). Son is working in a [employer].    Past Medical History:  Diagnosis Date  . Anxiety   . Cancer ([place]) 01/2017   cervical cancer   . Depression   . Genital warts   . Hypercholesteremia   . Hypertension   . Multiple sclerosis ([place]) 01/2016  . Multiple sclerosis ([place])   . Numbness    occassional numbnes in in extremities  . Vision abnormalities     Past Surgical History:  Procedure Laterality Date  . ABDOMINAL HYSTERECTOMY    . CERVICAL BIOPSY  W/ LOOP ELECTRODE EXCISION    . COLPOSCOPY    . DILATION AND CURETTAGE OF UTERUS  1994  . ROBOTIC ASSISTED TOTAL HYSTERECTOMY WITH BILATERAL SALPINGO OOPHERECTOMY Bilateral 02/22/2017   Procedure: XI ROBOTIC ASSISTED TOTAL HYSTERECTOMY WITH BILATERAL SALPINGECTOMY;   Surgeon: Everitt Amber, MD;  Location: [facility];  Service: Gynecology;  Laterality: Bilateral;    Current Outpatient Medications  Medication Sig Dispense Refill  . allopurinol (ZYLOPRIM) 100 MG tablet Take 100 mg by mouth daily.    Marland Kitchen amLODIPine-Valsartan-HCTZ 10-320-25 MG TABS Take 1 tablet by mouth daily.    . cetirizine (ZYRTEC) 10 MG tablet Take 10 mg by mouth at bedtime.     . cholecalciferol (VITAMIN D) 1000 units tablet Take 1,000 Units by mouth daily.    . Cyanocobalamin (VITAMIN B-12) 2500 MCG SUBL Place 2,500 mcg under the tongue daily.    . cyclobenzaprine (FLEXERIL) 5 MG tablet TAKE 1 TABLET BY MOUTH EVERY DAY AT BEDTIME 30 tablet 11  . Dimethyl Fumarate (TECFIDERA) 240 MG CPDR Take by mouth.    . fluticasone (FLONASE ALLERGY RELIEF) 50 MCG/ACT nasal spray Place 1 spray into both nostrils daily.    Marland Kitchen ibuprofen (ADVIL,MOTRIN) 600 MG tablet Take 1 tablet (600 mg total) by mouth every 6 (six) hours as needed. 30 tablet 0  . Lactobacillus (PROBIOTIC ACIDOPHILUS PO) Take 1 capsule by mouth at bedtime. 1 BILLION CFUs    . pravastatin (PRAVACHOL) 20 MG tablet Take 20 mg by mouth at bedtime.   3  . predniSONE (DELTASONE) 20 MG tablet Take 1 tablet by mouth 2 (two) times daily.    Marland Kitchen  sertraline (ZOLOFT) 50 MG tablet TAKE 1 TABLET (50 MG) BY MOUTH DAILY AT NIGHT.  3   No current facility-administered medications for this visit.     Family History  Problem Relation Age of Onset  . Diabetes Mellitus II Mother   . COPD Father     Review of Systems  Constitutional: Negative.   HENT: Negative.   Eyes: Negative.   Respiratory: Negative.   Cardiovascular: Negative.   Gastrointestinal: Negative.   Endocrine: Positive for heat intolerance.  Genitourinary:       Loss of urine with cough/sneeze  Musculoskeletal: Negative.   Skin: Negative.   Allergic/Immunologic: Negative.   Neurological: Negative.   Hematological: Negative.   Psychiatric/Behavioral: Negative.     Exam:   BP 140/80  (BP Location: Right Arm, Patient Position: Sitting, Cuff Size: Large)   Pulse 76   Ht 5' 3"  (1.6 m)   Wt 237 lb 12.8 oz (107.9 kg)   LMP 02/14/2017   BMI 42.12 kg/m   Weight change: @WEIGHTCHANGE @ Height:   Height: 5' 3"  (160 cm)  Ht Readings from Last 3 Encounters:  01/04/19 5' 3"  (1.6 m)  09/06/18 5' 3"  (1.6 m)  12/14/17 5' 3.25" (1.607 m)    General appearance: alert, cooperative and appears stated age Head: Normocephalic, without obvious abnormality, atraumatic Neck: no adenopathy, supple, symmetrical, trachea midline and thyroid normal to inspection and palpation Lungs: clear to auscultation bilaterally Cardiovascular: regular rate and rhythm Breasts: normal appearance, no masses or tenderness Abdomen: soft, non-tender; non distended,  no masses,  no organomegaly Extremities: extremities normal, atraumatic, no cyanosis or edema Skin: Skin color, texture, turgor normal. No rashes or lesions Lymph nodes: Cervical, supraclavicular, and axillary nodes normal. No abnormal inguinal nodes palpated Neurologic: Grossly normal   Pelvic: External genitalia:  no lesions              Urethra:  normal appearing urethra with no masses, tenderness or lesions              Bartholins and Skenes: normal                 Vagina: normal appearing vagina with normal color and discharge, no lesions              Cervix: absent               Bimanual Exam:  Uterus:  uterus absent              Adnexa: no mass, fullness, tenderness               Rectovaginal: Confirms               Anus:  normal sphincter tone, no lesions  Chaperone was present for exam.  A:  Well Woman with normal exam  H/O cervical cancer, s/p hysterectomy  Multiple medical problems, followed by primary and Neurology. No MS flares  P:   Pap with hpv  Mammogram due  Cologuard ordered (she will check with her insurance)  Labs with primary  Discussed breast self exam  Discussed calcium and vit D intake

## 2019-01-04 ENCOUNTER — Other Ambulatory Visit: Payer: Self-pay

## 2019-01-04 ENCOUNTER — Encounter: Payer: Self-pay | Admitting: Obstetrics and Gynecology

## 2019-01-04 ENCOUNTER — Ambulatory Visit: Payer: 59 | Admitting: Obstetrics and Gynecology

## 2019-01-04 ENCOUNTER — Other Ambulatory Visit (HOSPITAL_COMMUNITY)
Admission: RE | Admit: 2019-01-04 | Discharge: 2019-01-04 | Disposition: A | Discharge status: Home / Self Care | Payer: 59 | Source: Ambulatory Visit | Attending: Obstetrics and Gynecology | Admitting: Obstetrics and Gynecology

## 2019-01-04 VITALS — BP 140/80 | HR 76 | Ht 63.0 in | Wt 237.8 lb

## 2019-01-04 DIAGNOSIS — Z01419 Encounter for gynecological examination (general) (routine) without abnormal findings: Secondary | ICD-10-CM

## 2019-01-04 DIAGNOSIS — Z1272 Encounter for screening for malignant neoplasm of vagina: Secondary | ICD-10-CM

## 2019-01-04 DIAGNOSIS — Z1211 Encounter for screening for malignant neoplasm of colon: Secondary | ICD-10-CM

## 2019-01-04 DIAGNOSIS — Z8541 Personal history of malignant neoplasm of cervix uteri: Secondary | ICD-10-CM

## 2019-01-04 NOTE — Patient Instructions (Signed)
EXERCISE AND DIET:  We recommended that you start or continue a regular exercise program for good health. Regular exercise means any activity that makes your heart beat faster and makes you sweat.  We recommend exercising at least 30 minutes per day at least 3 days a week, preferably 4 or 5.  We also recommend a diet low in fat and sugar.  Inactivity, poor dietary choices and obesity can cause diabetes, heart attack, stroke, and kidney damage, among others.    ALCOHOL AND SMOKING:  Women should limit their alcohol intake to no more than 7 drinks/beers/glasses of wine (combined, not each!) per week. Moderation of alcohol intake to this level decreases your risk of breast cancer and liver damage. And of course, no recreational drugs are part of a healthy lifestyle.  And absolutely no smoking or even second hand smoke. Most people know smoking can cause heart and lung diseases, but did you know it also contributes to weakening of your bones? Aging of your skin?  Yellowing of your teeth and nails?  CALCIUM AND VITAMIN D:  Adequate intake of calcium and Vitamin D are recommended.  The recommendations for exact amounts of these supplements seem to change often, but generally speaking 1,000 mg of calcium (between diet and supplement) and 800 units of Vitamin D per day seems prudent. Certain women may benefit from higher intake of Vitamin D.  If you are among these women, your doctor will have told you during your visit.    PAP SMEARS:  Pap smears, to check for cervical cancer or precancers,  have traditionally been done yearly, although recent scientific advances have shown that most women can have pap smears less often.  However, every woman still should have a physical exam from her gynecologist every year. It will include a breast check, inspection of the vulva and vagina to check for abnormal growths or skin changes, a visual exam of the cervix, and then an exam to evaluate the size and shape of the uterus and  ovaries.  And after 47 years of age, a rectal exam is indicated to check for rectal cancers. We will also provide age appropriate advice regarding health maintenance, like when you should have certain vaccines, screening for sexually transmitted diseases, bone density testing, colonoscopy, mammograms, etc.   MAMMOGRAMS:  All women over 75 years old should have a yearly mammogram. Many facilities now offer a "3D" mammogram, which may cost around $50 extra out of pocket. If possible,  we recommend you accept the option to have the 3D mammogram performed.  It both reduces the number of women who will be called back for extra views which then turn out to be normal, and it is better than the routine mammogram at detecting truly abnormal areas.    COLON CANCER SCREENING: Now recommend starting at age 57. At this time colonoscopy is not covered for routine screening until 50. There are take home tests that can be done between 45-49.   COLONOSCOPY:  Colonoscopy to screen for colon cancer is recommended for all women at age 91.  We know, you hate the idea of the prep.  We agree, BUT, having colon cancer and not knowing it is worse!!  Colon cancer so often starts as a polyp that can be seen and removed at colonscopy, which can quite literally save your life!  And if your first colonoscopy is normal and you have no family history of colon cancer, most women don't have to have it again for  10 years.  Once every ten years, you can do something that may end up saving your life, right?  We will be happy to help you get it scheduled when you are ready.  Be sure to check your insurance coverage so you understand how much it will cost.  It may be covered as a preventative service at no cost, but you should check your particular policy.   ° ° ° °Breast Self-Awareness °Breast self-awareness means being familiar with how your breasts look and feel. It involves checking your breasts regularly and reporting any changes to your  health care provider. °Practicing breast self-awareness is important. A change in your breasts can be a sign of a serious medical problem. Being familiar with how your breasts look and feel allows you to find any problems early, when treatment is more likely to be successful. All women should practice breast self-awareness, including women who have had breast implants. °How to do a breast self-exam °One way to learn what is normal for your breasts and whether your breasts are changing is to do a breast self-exam. To do a breast self-exam: °Look for Changes ° °1. Remove all the clothing above your waist. °2. Stand in front of a mirror in a room with good lighting. °3. Put your hands on your hips. °4. Push your hands firmly downward. °5. Compare your breasts in the mirror. Look for differences between them (asymmetry), such as: °? Differences in shape. °? Differences in size. °? Puckers, dips, and bumps in one breast and not the other. °6. Look at each breast for changes in your skin, such as: °? Redness. °? Scaly areas. °7. Look for changes in your nipples, such as: °? Discharge. °? Bleeding. °? Dimpling. °? Redness. °? A change in position. °Feel for Changes °Carefully feel your breasts for lumps and changes. It is best to do this while lying on your back on the floor and again while sitting or standing in the shower or tub with soapy water on your skin. Feel each breast in the following way: °· Place the arm on the side of the breast you are examining above your head. °· Feel your breast with the other hand. °· Start in the nipple area and make ¾ inch (2 cm) overlapping circles to feel your breast. Use the pads of your three middle fingers to do this. Apply light pressure, then medium pressure, then firm pressure. The light pressure will allow you to feel the tissue closest to the skin. The medium pressure will allow you to feel the tissue that is a little deeper. The firm pressure will allow you to feel the tissue  close to the ribs. °· Continue the overlapping circles, moving downward over the breast until you feel your ribs below your breast. °· Move one finger-width toward the center of the body. Continue to use the ¾ inch (2 cm) overlapping circles to feel your breast as you move slowly up toward your collarbone. °· Continue the up and down exam using all three pressures until you reach your armpit. ° °Write Down What You Find ° °Write down what is normal for each breast and any changes that you find. Keep a written record with breast changes or normal findings for each breast. By writing this information down, you do not need to depend only on memory for size, tenderness, or location. Write down where you are in your menstrual cycle, if you are still menstruating. °If you are having trouble noticing differences   in your breasts, do not get discouraged. With time you will become more familiar with the variations in your breasts and more comfortable with the exam. How often should I examine my breasts? Examine your breasts every month. If you are breastfeeding, the best time to examine your breasts is after a feeding or after using a breast pump. If you menstruate, the best time to examine your breasts is 5-7 days after your period is over. During your period, your breasts are lumpier, and it may be more difficult to notice changes. When should I see my health care provider? See your health care provider if you notice:  A change in shape or size of your breasts or nipples.  A change in the skin of your breast or nipples, such as a reddened or scaly area.  Unusual discharge from your nipples.  A lump or thick area that was not there before.  Pain in your breasts.  Anything that concerns you.

## 2019-01-04 NOTE — Addendum Note (Signed)
Addended by: Dorothy Spark on: 01/04/2019 04:40 PM   Modules accepted: Orders

## 2019-01-08 LAB — CYTOLOGY - PAP
DIAGNOSIS: NEGATIVE
HPV (WINDOPATH): NOT DETECTED

## 2019-01-15 ENCOUNTER — Other Ambulatory Visit: Payer: Self-pay | Admitting: Neurology

## 2019-01-23 DIAGNOSIS — Z1211 Encounter for screening for malignant neoplasm of colon: Secondary | ICD-10-CM | POA: Diagnosis not present

## 2019-01-23 DIAGNOSIS — Z1212 Encounter for screening for malignant neoplasm of rectum: Secondary | ICD-10-CM | POA: Diagnosis not present

## 2019-01-27 LAB — COLOGUARD: COLOGUARD: NEGATIVE

## 2019-02-06 ENCOUNTER — Telehealth: Payer: Self-pay | Admitting: *Deleted

## 2019-02-06 NOTE — Telephone Encounter (Signed)
Patient notified of negative cologuard results. Results entered in Epic, copy of report to scan.  Patient verbalizes understanding and is agreeable.  Encounter closed.

## 2019-03-06 ENCOUNTER — Telehealth: Payer: Self-pay | Admitting: *Deleted

## 2019-03-06 NOTE — Addendum Note (Signed)
Addended by: Hope Pigeon on: 03/06/2019 03:08 PM   Modules accepted: Orders

## 2019-03-06 NOTE — Telephone Encounter (Signed)
5-19 Pt has called and gave verbal consent to file insurance for [email or web address] vv email confirmed as:[email or web address]  Pt understands that although there may be some limitations with this type of visit, we will take all precautions to reduce any security or privacy concerns.  Pt understands that this will be treated like an in office visit and we will file with pt's insurance, and there may be a patient responsible charge related to this service. *e-mail will be sent*

## 2019-03-06 NOTE — Telephone Encounter (Signed)
Called, LVM for pt to call today about appt tomorrow.   Due to current COVID-19 pandemic, our office is severely reducing in office visits in order to minimize the risk to our patients and healthcare providers.  Dr. Felecia Shelling would like to transition her to a virtual visit [email or web address]). We would need email address and I need to update her chart information.

## 2019-03-06 NOTE — Telephone Encounter (Signed)
Called pt. Updated medication list, pharmacy, allergies on file for VV tomorrow. Asked her to call back if she does not receive email. She verbalized understanding.

## 2019-03-07 ENCOUNTER — Encounter: Payer: Self-pay | Admitting: Neurology

## 2019-03-07 ENCOUNTER — Ambulatory Visit (INDEPENDENT_AMBULATORY_CARE_PROVIDER_SITE_OTHER): Payer: 59 | Admitting: Neurology

## 2019-03-07 ENCOUNTER — Other Ambulatory Visit: Payer: Self-pay

## 2019-03-07 DIAGNOSIS — R5383 Other fatigue: Secondary | ICD-10-CM

## 2019-03-07 DIAGNOSIS — Z79899 Other long term (current) drug therapy: Secondary | ICD-10-CM

## 2019-03-07 DIAGNOSIS — G35 Multiple sclerosis: Secondary | ICD-10-CM | POA: Diagnosis not present

## 2019-03-07 NOTE — Progress Notes (Signed)
p  [facility]  PATIENT: Beverly Beltran DOB: August 10, 1971  REFERRING DOCTOR OR PCP:  Maurice Small SOURCE: Patient, ED and hospital notes in [place], labs and imaging reports in Epic, personal review of MRI images on PACS.  _________________________________   HISTORICAL  CHIEF COMPLAINT:  Chief Complaint  Patient presents with  . Multiple Sclerosis    On Tecfidera    HISTORY OF PRESENT ILLNESS:  Beverly Beltran is a 48 y.o. woman with MS.  Update 03/07/2019: Virtual Visit via Video Note I connected with Rudy Jew on 03/07/19 at  3:30 PM EDT by a video enabled telemedicine application and verified that I am speaking with the correct person.  I discussed the limitations of evaluation and management by telemedicine and the availability of in person appointments. The patient expressed understanding and agreed to proceed.  History of Present Illness: She is on Tecfidera for her MS and is tolerating it well.   She denies stomach issues and no recent flushing.    She has not had any exacerbations.   She denies new symptoms.   She is walking about the same and balance is fine.   She doe snot note numbness or weakness.   Vision has changed some but this seems correctable.    Bladder function is fine.       She notes some fatigue but this is not preventing her from doing activities.   She is not doing any exercise and just gets outdoors for a few minutes to take the dog out.     She takes vit D supplements.   She is sleeping well at night.  Cyclobenzaprine at night helps the insomnia.  Mood is okay.  She is on Zoloft for depression..   Cognition is doing well.   She is on allopurinol for gout.      Observations/Objective: She is a well-developed well-nourished woman in no acute distress.  The head is normocephalic and atraumatic.  Sclera are anicteric.  Visible skin appears normal.  The neck has a good range of motion.    She is alert and fully oriented with fluent speech  and good attention, knowledge and memory.  Extraocular muscles are intact.  Facial strength is normal.    She appears to have normal strength in the arms.     Assessment and Plan: Multiple sclerosis ([place])  High risk medication use  Other fatigue  1.  Continue Tecfidera for MS.  We will check blood work at the next visit.  Later in the year we need to check an MRI of the brain to determine if there is any subclinical progression.  If present, consider a different disease modifying therapy. 2.   Try to be more active and exercise more.  Continue vitamin D. 3.   Return in 4 months or sooner if there are new or worsening neurologic symptoms.  Follow Up Instructions: I discussed the assessment and treatment plan with the patient. The patient was provided an opportunity to ask questions and all were answered. The patient agreed with the plan and demonstrated an understanding of the instructions.    The patient was advised to call back or seek an in-person evaluation if the symptoms worsen or if the condition fails to improve as anticipated.  I provided 18 minutes of non-face-to-face time during this encounter. ____________________________ From previous visits: Update 09/06/2018: She feels her MS has been stable.    She tolerates Tecfidera well   She has some flushing twice a  month and it resolves with aspirin.  She has rare GERD but no other GI issue.     Her last MRI 04/19/2018 did not show any lesions.  Gait and strength are fine.   Balance and coordination are fine.   [name] will sometimes have some mild hand numbness but this is intermittent, often position related.    She has some urinary urgency and rare stress incontinence.   Vision is doing well.   She has new contacts.   She notes some difficulty with reading, better with mild readers.    She feels her fatigue is mild and tolerable.   She has more in the afternoons.   She has sleep onset insomnia but sleeps well once asleep.   Mood is doing well  on sertraline.   Cognition is fine.   She had a hysterectomy May 2018 for cervical cancer FIGO Stage IA1  From 02/22/2016:   Around 01/28/2016, she had the onset of slurred speech and also felt more fatigue. Additionally, there was a dysesthetic numbness going into the left arm towards the thumb. She notes that she had just moved into a new house and had done quite a bit of activity over the preceding couple of weeks when symptoms did not improve after couple days, she went to the emergency room. There, she was admitted. The initial CT scan was normal but MRI of the brain was consistent with MS. MRI of the spine did not show any MS lesions but did show significant degenerative changes at C5-C6 as well as T7-T8, T8-T9 and T9-T10..   She received 5 days of steroids and was discharged home.  By the third or fourth day of IV steroids, symptoms were much better.   She was discharged after 5 days of steroids.  Gait/strength/sensation: She denies any difficulty with gait. She could climb a ladder if she wanted to. She denies any significant difficulty with strength or sensation at this time.  She still gets some dysesthetic sensation down the left arm towards the thumb at times. This can occur with sitting and not necessarily with changes in position.  Bladder/bowel: She notes some urinary frequency and urgency but is not having incontinence. There is no hesitancy. She has nocturia x 2.  She does note some bowel constipation. Both of these issues have occurred for several years at least.  Vision: She notes that several years ago when she was in [place] she had visual blurring and was noted to have an abnormality and was having a procedure that sounds like OCT every 6 months to measure the nerve fibers.  Fatigue/sleep: She does note quite a bit of fatigue that is worse as the day goes on and worse with heat. She has some excessive daytime sleepiness and would doze off if she is resting and sometimes will  watching TV and other activities. She notes insomnia with more difficulty falling than staying asleep.    Mood/cognition: She is on sertraline 50 mg for anxiety more than for depression.   This has been beneficial to her. She currently denies any cognitive issues but noted difficulty coming out with the right words in the midst of her exacerbation last month. This has improved.  I personally reviewed the CT scan from 02/03/2016, MRI from 02/03/2016 of the brain and spine and the contrasted MRI of the brain from 02/04/2016 on PACS. MRI of the brain shows an enhancing large focus in the left frontal lobe, is also hyperintense on diffusion-weighted images. There are  several other T2/FLAIR hyperintense foci in the periventricular, deep and juxtacortical white matter. The MRI of the cervical spine shows degenerative changes at C5-C6 causing severe right and moderate left foraminal narrowing that could lead to right C6 nerve root compression there are milder degenerative changes at C3-C4 and C4-C5 with less potential for nerve root impingement.  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 The spinal cord had normal signal.  Notes from her emergency room and hospital visits from 02/03/2016 through 02/09/2016 were reviewed. She presented with several days of slurred speech and left arm numbness. He received IV Solu-Medrol and was discharged for 02/09/2016.   She had elevated glucose while on steroids.  REVIEW OF SYSTEMS: Constitutional: No fevers, chills, sweats, or change in appetite   she notes fatigue, insomnia and daytime hypersomnia Eyes: No visual changes, double vision, eye pain Ear, nose and throat: No hearing loss, ear pain, nasal congestion, sore throat Cardiovascular: No chest pain, palpitations Respiratory: No shortness of breath at rest or with exertion.   No wheezes.   She snores  GastrointestinaI: No nausea, vomiting, diarrhea, abdominal pain, fecal incontinence.  Some constipation Genitourinary: No dysuria, urinary retention.  She has frequency and nocturia. Musculoskeletal: No neck pain, back pain Integumentary: No rash, pruritus, skin lesions Neurological: as above Psychiatric: No depression at this time.  No anxiety now (had in past) Endocrine: No palpitations, diaphoresis, change in appetite, change in weigh or increased thirst Hematologic/Lymphatic: No anemia, purpura, petechiae. Allergic/Immunologic: No itchy/runny eyes, nasal congestion, recent allergic reactions, rashes  ALLERGIES: No Known Allergies  HOME MEDICATIONS:  Current Outpatient Medications:  .  allopurinol (ZYLOPRIM) 100 MG tablet, Take 100 mg by mouth daily., Disp: , Rfl:  .  amLODIPine-Valsartan-HCTZ 10-320-25 MG TABS, Take 1 tablet by mouth daily., Disp: , Rfl:  .  cetirizine (ZYRTEC) 10 MG tablet, Take 10 mg by mouth at bedtime. , Disp: , Rfl:  .  cholecalciferol (VITAMIN D) 1000 units tablet, Take 1,000 Units by mouth daily., Disp: , Rfl:  .  Cyanocobalamin (VITAMIN B-12) 2500 MCG SUBL, Place 2,500 mcg under the tongue daily., Disp: , Rfl:  .  cyclobenzaprine (FLEXERIL) 5 MG tablet, TAKE 1 TABLET BY MOUTH EVERY DAY AT BEDTIME, Disp: 30 tablet, Rfl: 11 .  Dimethyl Fumarate (TECFIDERA) 240 MG CPDR, Take 240 mg by mouth 2 (two) times daily. , Disp: , Rfl:  .  fluticasone (FLONASE ALLERGY RELIEF) 50 MCG/ACT nasal spray, Place 1 spray into both nostrils daily., Disp: , Rfl:  .  ibuprofen (ADVIL,MOTRIN) 600 MG tablet, Take 1 tablet (600 mg total) by mouth every 6 (six) hours as needed., Disp: 30 tablet, Rfl: 0 .  Lactobacillus (PROBIOTIC ACIDOPHILUS PO), Take 1 capsule by mouth at bedtime. 1 BILLION CFUs, Disp: , Rfl:  .  pravastatin (PRAVACHOL) 20 MG tablet, Take 20 mg by mouth at bedtime. , Disp: , Rfl: 3 .  sertraline (ZOLOFT) 50 MG tablet, TAKE 1 TABLET (50 MG) BY MOUTH DAILY AT NIGHT.,  Disp: , Rfl: 3  PAST MEDICAL HISTORY: Past Medical History:  Diagnosis Date  . Anxiety   . Cancer ([place]) 01/2017   cervical cancer   . Depression   . Genital warts   . Hypercholesteremia   . Hypertension   . Multiple sclerosis ([place]) 01/2016  . Multiple sclerosis ([place])   . Numbness    occassional numbnes in in extremities  .  Vision abnormalities     PAST SURGICAL HISTORY: Past Surgical History:  Procedure Laterality Date  . ABDOMINAL HYSTERECTOMY    . CERVICAL BIOPSY  W/ LOOP ELECTRODE EXCISION    . COLPOSCOPY    . DILATION AND CURETTAGE OF UTERUS  1994  . ROBOTIC ASSISTED TOTAL HYSTERECTOMY WITH BILATERAL SALPINGO OOPHERECTOMY Bilateral 02/22/2017   Procedure: XI ROBOTIC ASSISTED TOTAL HYSTERECTOMY WITH BILATERAL SALPINGECTOMY;  Surgeon: Everitt Amber, MD;  Location: [facility];  Service: Gynecology;  Laterality: Bilateral;    FAMILY HISTORY: Family History  Problem Relation Age of Onset  . Diabetes Mellitus II Mother   . COPD Father     SOCIAL HISTORY:  Social History   Socioeconomic History  . Marital status: Married    Spouse name: Not on file  . Number of children: Not on file  . Years of education: Not on file  . Highest education level: Not on file  Occupational History  . Not on file  Social Needs  . Financial resource strain: Not on file  . Food insecurity:    Worry: Not on file    Inability: Not on file  . Transportation needs:    Medical: Not on file    Non-medical: Not on file  Tobacco Use  . Smoking status: Never Smoker  . Smokeless tobacco: Never Used  Substance and Sexual Activity  . Alcohol use: No  . Drug use: No  . Sexual activity: Yes    Partners: Male    Birth control/protection: Surgical    Comment: hysterectomy   Lifestyle  . Physical activity:    Days per week: Not on file    Minutes per session: Not on file  . Stress: Not on file  Relationships  . Social connections:    Talks on phone: Not on file    Gets together: Not on file     Attends religious service: Not on file    Active member of club or organization: Not on file    Attends meetings of clubs or organizations: Not on file    Relationship status: Not on file  . Intimate partner violence:    Fear of current or ex partner: Not on file    Emotionally abused: Not on file    Physically abused: Not on file    Forced sexual activity: Not on file  Other Topics Concern  . Not on file  Social History Narrative  . Not on file     PHYSICAL EXAM  There were no vitals filed for this visit.  There is no height or weight on file to calculate BMI.   General: The patient is well-developed and well-nourished and in no acute distress   Neurologic Exam  Mental status: The patient is alert and oriented x 3 at the time of the examination. The patient has apparent normal recent and remote memory, with an apparently normal attention span and concentration ability.   Speech is normal.  Cranial nerves: Extraocular movements are full.   Facial strength and sensation is normal.  Trapezius strength is normal..  The tongue is midline, and the patient has symmetric elevation of the soft palate. No obvious hearing deficits are noted.  Motor:  Muscle bulk is normal.   Tone is normal. Strength is  5 / 5 in all 4 extremities.   Sensory: Sensory testing is intact to pinprick, soft touch and vibration sensation in all 4 extremities.  Coordination: Cerebellar testing reveals good finger-nose-finger and heel-to-shin bilaterally.  Gait and  station: Station is normal.   The gait is normal.  Tandem gait is mildly wide.  Romberg is negative.   Reflexes: Deep tendon reflexes are symmetric and normal bilaterally.   Plantar responses are flexor.    DIAGNOSTIC DATA (LABS, IMAGING, TESTING) - I reviewed patient records, labs, notes, testing and imaging myself where available.  Lab Results  Component Value Date   WBC 6.5 09/06/2018   HGB 12.9 09/06/2018   HCT 39.8 09/06/2018   MCV  79 09/06/2018   PLT 339 09/06/2018      Component Value Date/Time   NA 134 (L) 02/23/2017 0426   K 4.4 02/23/2017 0426   CL 100 (L) 02/23/2017 0426   CO2 27 02/23/2017 0426   GLUCOSE 154 (H) 02/23/2017 0426   BUN 10 02/23/2017 0426   CREATININE 0.72 02/23/2017 0426   CALCIUM 9.3 02/23/2017 0426   PROT 8.3 (H) 02/18/2017 1351   ALBUMIN 4.5 02/18/2017 1351   AST 26 02/18/2017 1351   ALT 33 02/18/2017 1351   ALKPHOS 55 02/18/2017 1351   BILITOT 0.4 02/18/2017 1351   GFRNONAA >60 02/23/2017 0426   GFRAA >60 02/23/2017 0426    Huie Ghuman A. Felecia Shelling, MD, PhD 07/03/3845, 6:59 PM Certified in Neurology, Clinical Neurophysiology, Sleep Medicine, Pain Medicine and Neuroimaging  [facility] [address], [place], [place] [address] [phone]

## 2019-03-27 ENCOUNTER — Other Ambulatory Visit: Payer: Self-pay

## 2019-05-02 ENCOUNTER — Other Ambulatory Visit: Payer: Self-pay | Admitting: Neurology

## 2019-07-04 ENCOUNTER — Telehealth: Payer: Self-pay | Admitting: Neurology

## 2019-07-04 DIAGNOSIS — G35 Multiple sclerosis: Secondary | ICD-10-CM

## 2019-07-04 NOTE — Telephone Encounter (Signed)
Called [facility] back. Advised MD prefers to keep pt on brand (rx already sent DAW 04/2019). She verbalized understanding. Nothing further needed.

## 2019-07-04 NOTE — Telephone Encounter (Signed)
Ria Comment from [facility] called wanting to know if it is ok to give the pt the generic of the [place] Please advise.

## 2019-07-10 ENCOUNTER — Other Ambulatory Visit: Payer: Self-pay

## 2019-07-10 ENCOUNTER — Encounter: Payer: Self-pay | Admitting: Neurology

## 2019-07-10 ENCOUNTER — Ambulatory Visit (INDEPENDENT_AMBULATORY_CARE_PROVIDER_SITE_OTHER): Payer: 59 | Admitting: Neurology

## 2019-07-10 VITALS — BP 106/76 | HR 59 | Temp 96.6°F | Ht 63.0 in | Wt 231.5 lb

## 2019-07-10 DIAGNOSIS — R202 Paresthesia of skin: Secondary | ICD-10-CM

## 2019-07-10 DIAGNOSIS — Z79899 Other long term (current) drug therapy: Secondary | ICD-10-CM | POA: Diagnosis not present

## 2019-07-10 DIAGNOSIS — G4700 Insomnia, unspecified: Secondary | ICD-10-CM

## 2019-07-10 DIAGNOSIS — G35 Multiple sclerosis: Secondary | ICD-10-CM | POA: Diagnosis not present

## 2019-07-10 NOTE — Progress Notes (Signed)
p  [facility]  PATIENT: Beverly Beltran DOB: 10/26/1970  REFERRING DOCTOR OR PCP:  Maurice Small SOURCE: Patient, ED and hospital notes in [place], labs and imaging reports in Epic, personal review of MRI images on PACS.  _________________________________   HISTORICAL  CHIEF COMPLAINT:  Chief Complaint  Patient presents with  . Follow-up    RM 13, alone. Last seen 03/07/2019. No new sx per pt.   . Multiple Sclerosis    On Tecfidera (needs labs/MRI)    HISTORY OF PRESENT ILLNESS:  Beverly Beltran is a 48 y.o. woman with MS.  Update 07/10/2019: She is on Tecfidera and she tolerates it well.   She had been on Vumerity during the drug study but at the end we had insurance issues so we switched to Tecfidera.   She denies any exacerbations.   Gait, strength and sensation are doing well.  Arm numbness at the onset of her MS completely resolved.  Bladder function is fine.   Vision is correctable to 2o/20 and no color issues.  She denies fatigue.   She has some sleep onset insomnia.   Sometimes she notes some spasticity in her legs at night and occasional twitching and rare cramps in her side.   Mood is doing well.   She denies any major cognitive issues.  She notes some word finding issues  Update 03/07/2019 (video) She is on Tecfidera for her MS and is tolerating it well.   She denies stomach issues and no recent flushing.    She has not had any exacerbations.   She denies new symptoms.   She is walking about the same and balance is fine.   She does not note numbness or weakness.   Vision has changed some but this seems correctable.    Bladder function is fine.       She notes some fatigue but this is not preventing her from doing activities.   She is not doing any exercise and just gets outdoors for a few minutes to take the dog out.     She takes vit D supplements.   She is sleeping well at night.  Cyclobenzaprine at night helps the insomnia.  Mood is okay.  She is on  Zoloft for depression..   Cognition is doing well.   She is on allopurinol for gout.    Update 09/06/2018: She feels her MS has been stable.    She tolerates Tecfidera well   She has some flushing twice a month and it resolves with aspirin.  She has rare GERD but no other GI issue.     Her last MRI 04/19/2018 did not show any lesions.  Gait and strength are fine.   Balance and coordination are fine.   Sh will sometimes have some mild hand numbness but this is intermittent, often position related.    She has some urinary urgency and rare stress incontinence.   Vision is doing well.   She has new contacts.   She notes some difficulty with reading, better with mild readers.    She feels her fatigue is mild and tolerable.   She has more in the afternoons.   She has sleep onset insomnia but sleeps well once asleep.   Mood is doing well on sertraline.   Cognition is fine.   She had a hysterectomy May 2018 for cervical cancer FIGO Stage IA1  From 02/22/2016:   Around 01/28/2016, she had the onset of slurred speech and also felt  more fatigue. Additionally, there was a dysesthetic numbness going into the left arm towards the thumb. She notes that she had just moved into a new house and had done quite a bit of activity over the preceding couple of weeks when symptoms did not improve after couple days, she went to the emergency room. There, she was admitted. The initial CT scan was normal but MRI of the brain was consistent with MS. MRI of the spine did not show any MS lesions but did show significant degenerative changes at C5-C6 as well as T7-T8, T8-T9 and T9-T10..   She received 5 days of steroids and was discharged home.  By the third or fourth day of IV steroids, symptoms were much better.   She was discharged after 5 days of steroids.  Gait/strength/sensation: She denies any difficulty with gait. She could climb a ladder if she wanted to. She denies any significant difficulty with strength or sensation at  this time.  She still gets some dysesthetic sensation down the left arm towards the thumb at times. This can occur with sitting and not necessarily with changes in position.  Bladder/bowel: She notes some urinary frequency and urgency but is not having incontinence. There is no hesitancy. She has nocturia x 2.  She does note some bowel constipation. Both of these issues have occurred for several years at least.  Vision: She notes that several years ago when she was in [place] she had visual blurring and was noted to have an abnormality and was having a procedure that sounds like OCT every 6 months to measure the nerve fibers.  Fatigue/sleep: She does note quite a bit of fatigue that is worse as the day goes on and worse with heat. She has some excessive daytime sleepiness and would doze off if she is resting and sometimes will watching TV and other activities. She notes insomnia with more difficulty falling than staying asleep.    Mood/cognition: She is on sertraline 50 mg for anxiety more than for depression.   This has been beneficial to her. She currently denies any cognitive issues but noted difficulty coming out with the right words in the midst of her exacerbation last month. This has improved.  I personally reviewed the CT scan from 02/03/2016, MRI from 02/03/2016 of the brain and spine and the contrasted MRI of the brain from 02/04/2016 on PACS. MRI of the brain shows an enhancing large focus in the left frontal lobe, is also hyperintense on diffusion-weighted images. There are several other T2/FLAIR hyperintense foci in the periventricular, deep and juxtacortical white matter. The MRI of the cervical spine shows degenerative changes at C5-C6 causing severe right and moderate left foraminal narrowing that could lead to right C6 nerve root compression there are milder degenerative changes at C3-C4 and C4-C5 with less potential for nerve root impingement.  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 The spinal cord had normal signal.  Notes from her emergency room and hospital visits from 02/03/2016 through 02/09/2016 were reviewed. She presented with several days of slurred speech and left arm numbness. He received IV Solu-Medrol and was discharged for 02/09/2016.   She had elevated glucose while on steroids.  REVIEW OF SYSTEMS: Constitutional: No fevers, chills, sweats, or change in appetite   she notes fatigue, insomnia and daytime hypersomnia Eyes: No visual changes, double vision, eye pain Ear, nose and throat: No  hearing loss, ear pain, nasal congestion, sore throat Cardiovascular: No chest pain, palpitations Respiratory: No shortness of breath at rest or with exertion.   No wheezes.   She snores GastrointestinaI: No nausea, vomiting, diarrhea, abdominal pain, fecal incontinence.  Some constipation Genitourinary: No dysuria, urinary retention.  She has frequency and nocturia. Musculoskeletal: No neck pain, back pain Integumentary: No rash, pruritus, skin lesions Neurological: as above Psychiatric: No depression at this time.  No anxiety now (had in past) Endocrine: No palpitations, diaphoresis, change in appetite, change in weigh or increased thirst Hematologic/Lymphatic: No anemia, purpura, petechiae. Allergic/Immunologic: No itchy/runny eyes, nasal congestion, recent allergic reactions, rashes  ALLERGIES: No Known Allergies  HOME MEDICATIONS:  Current Outpatient Medications:  .  allopurinol (ZYLOPRIM) 100 MG tablet, Take 100 mg by mouth daily., Disp: , Rfl:  .  amLODIPine-Valsartan-HCTZ 10-320-25 MG TABS, Take 1 tablet by mouth daily., Disp: , Rfl:  .  cetirizine (ZYRTEC) 10 MG tablet, Take 10 mg by mouth at bedtime. , Disp: , Rfl:  .  cholecalciferol (VITAMIN D) 1000 units tablet, Take 1,000 Units by mouth daily., Disp: , Rfl:  .  Cyanocobalamin (VITAMIN  B-12) 2500 MCG SUBL, Place 2,500 mcg under the tongue daily., Disp: , Rfl:  .  cyclobenzaprine (FLEXERIL) 5 MG tablet, TAKE 1 TABLET BY MOUTH EVERY DAY AT BEDTIME, Disp: 30 tablet, Rfl: 11 .  fluticasone (FLONASE ALLERGY RELIEF) 50 MCG/ACT nasal spray, Place 1 spray into both nostrils daily., Disp: , Rfl:  .  ibuprofen (ADVIL,MOTRIN) 600 MG tablet, Take 1 tablet (600 mg total) by mouth every 6 (six) hours as needed., Disp: 30 tablet, Rfl: 0 .  Lactobacillus (PROBIOTIC ACIDOPHILUS PO), Take 1 capsule by mouth at bedtime. 1 BILLION CFUs, Disp: , Rfl:  .  pravastatin (PRAVACHOL) 20 MG tablet, Take 20 mg by mouth at bedtime. , Disp: , Rfl: 3 .  sertraline (ZOLOFT) 50 MG tablet, TAKE 1 TABLET (50 MG) BY MOUTH DAILY AT NIGHT., Disp: , Rfl: 3 .  TECFIDERA 240 MG CPDR, TAKE ONE CAPSULE BY MOUTH TWICE DAILY. STORE IN ORIGINAL CONTAINER AT ROOM TEMPERATURE., Disp: 60 capsule, Rfl: 11  PAST MEDICAL HISTORY: Past Medical History:  Diagnosis Date  . Anxiety   . Cancer ([place]) 01/2017   cervical cancer   . Depression   . Genital warts   . Hypercholesteremia   . Hypertension   . Multiple sclerosis ([place]) 01/2016  . Multiple sclerosis ([place])   . Numbness    occassional numbnes in in extremities  . Vision abnormalities     PAST SURGICAL HISTORY: Past Surgical History:  Procedure Laterality Date  . ABDOMINAL HYSTERECTOMY    . CERVICAL BIOPSY  W/ LOOP ELECTRODE EXCISION    . COLPOSCOPY    . DILATION AND CURETTAGE OF UTERUS  1994  . ROBOTIC ASSISTED TOTAL HYSTERECTOMY WITH BILATERAL SALPINGO OOPHERECTOMY Bilateral 02/22/2017   Procedure: XI ROBOTIC ASSISTED TOTAL HYSTERECTOMY WITH BILATERAL SALPINGECTOMY;  Surgeon: Everitt Amber, MD;  Location: [facility];  Service: Gynecology;  Laterality: Bilateral;    FAMILY HISTORY: Family History  Problem Relation Age of Onset  . Diabetes Mellitus II Mother   . COPD Father     SOCIAL HISTORY:  Social History   Socioeconomic History  . Marital status: Married     Spouse name: Not on file  . Number of children: Not on file  . Years of education: Not on file  . Highest education level: Not on file  Occupational History  . Not on file  Social Needs  . Financial resource strain: Not on file  . Food insecurity    Worry: Not on file    Inability: Not on file  . Transportation needs    Medical: Not on file    Non-medical: Not on file  Tobacco Use  . Smoking status: Never Smoker  . Smokeless tobacco: Never Used  Substance and Sexual Activity  . Alcohol use: No  . Drug use: No  . Sexual activity: Yes    Partners: Male    Birth control/protection: Surgical    Comment: hysterectomy   Lifestyle  . Physical activity    Days per week: Not on file    Minutes per session: Not on file  . Stress: Not on file  Relationships  . Social Herbalist on phone: Not on file    Gets together: Not on file    Attends religious service: Not on file    Active member of club or organization: Not on file    Attends meetings of clubs or organizations: Not on file    Relationship status: Not on file  . Intimate partner violence    Fear of current or ex partner: Not on file    Emotionally abused: Not on file    Physically abused: Not on file    Forced sexual activity: Not on file  Other Topics Concern  . Not on file  Social History Narrative  . Not on file     PHYSICAL EXAM  Vitals:   07/10/19 1104  BP: 106/76  Pulse: (!) 59  Temp: (!) 96.6 F (35.9 C)  Weight: 231 lb 8 oz (105 kg)  Height: 5' 3"  (1.6 m)    Body mass index is 41.01 kg/m.   General: The patient is well-developed and well-nourished and in no acute distress   Neurologic Exam  Mental status: The patient is alert and oriented x 3 at the time of the examination. The patient has apparent normal recent and remote memory, with an apparently normal attention span and concentration ability.   Speech is normal.  Cranial nerves: Extraocular movements are full.   Facial  strength and sensation is normal.  Trapezius strength is normal.. No obvious hearing deficits.  Motor:  Muscle bulk is normal.   Tone is normal. Strength is  5 / 5 in all 4 extremities.   Sensory: Sensory testing is intact to touch, temperature and vibration.   Coordination: Cerebellar testing reveals good finger-nose-finger and heel-to-shin bilaterally.  Gait and station: Station is normal.   The gait is normal.  Her tandem gait is mildly wide.  Romberg is negative..   Reflexes: Deep tendon reflexes are symmetric and normal bilaterally.       DIAGNOSTIC DATA (LABS, IMAGING, TESTING) - I reviewed patient records, labs, notes, testing and imaging myself where available.  Lab Results  Component Value Date   WBC 6.5 09/06/2018   HGB 12.9 09/06/2018   HCT 39.8 09/06/2018   MCV 79 09/06/2018   PLT 339 09/06/2018      Component Value Date/Time   NA 134 (L) 02/23/2017 0426   K 4.4 02/23/2017 0426   CL 100 (L) 02/23/2017 0426   CO2 27 02/23/2017 0426   GLUCOSE 154 (H) 02/23/2017 0426   BUN 10 02/23/2017 0426   CREATININE 0.72 02/23/2017 0426   CALCIUM 9.3 02/23/2017 0426   PROT 8.3 (H) 02/18/2017 1351   ALBUMIN 4.5 02/18/2017 1351   AST 26 02/18/2017 1351  ALT 33 02/18/2017 1351   ALKPHOS 55 02/18/2017 1351   BILITOT 0.4 02/18/2017 1351   GFRNONAA >60 02/23/2017 0426   GFRAA >60 02/23/2017 0426   ___________________________________________________   Multiple sclerosis ([place]) - Plan: CBC with Differential/Platelet, MR BRAIN W WO CONTRAST  Insomnia, unspecified type  High risk medication use  Paresthesia of left arm   1.   Continue Tecfidera.  We will check CBC with differential today.  Additionally, we will check an MRI of the brain to determine if there is any subclinical progression.  If present, consider a switch to a different disease modifying therapy. 2.   Stay active and exercise as tolerated. 3.   Add melatonin at night to help with insomnia.  If this worsens  consider a different medication. 4.   Return in 6 months or sooner if there are new or worsening neurologic symptoms.  Andreka Stucki A. Felecia Shelling, MD, PhD 04/14/3150, 76:16 PM Certified in Neurology, Clinical Neurophysiology, Sleep Medicine, Pain Medicine and Neuroimaging  [facility] [address], [place], [place] [address] [phone]

## 2019-07-11 ENCOUNTER — Telehealth: Payer: Self-pay | Admitting: *Deleted

## 2019-07-11 LAB — CBC WITH DIFFERENTIAL/PLATELET
Basophils Absolute: 0 10*3/uL (ref 0.0–0.2)
Basos: 0 %
EOS (ABSOLUTE): 0.2 10*3/uL (ref 0.0–0.4)
Eos: 2 %
Hematocrit: 39.6 % (ref 34.0–46.6)
Hemoglobin: 12.9 g/dL (ref 11.1–15.9)
Immature Grans (Abs): 0 10*3/uL (ref 0.0–0.1)
Immature Granulocytes: 0 %
Lymphocytes Absolute: 1.8 10*3/uL (ref 0.7–3.1)
Lymphs: 27 %
MCH: 27 pg (ref 26.6–33.0)
MCHC: 32.6 g/dL (ref 31.5–35.7)
MCV: 83 fL (ref 79–97)
Monocytes Absolute: 0.5 10*3/uL (ref 0.1–0.9)
Monocytes: 7 %
Neutrophils Absolute: 4.2 10*3/uL (ref 1.4–7.0)
Neutrophils: 64 %
Platelets: 220 10*3/uL (ref 150–450)
RBC: 4.78 x10E6/uL (ref 3.77–5.28)
RDW: 14.9 % (ref 11.7–15.4)
WBC: 6.7 10*3/uL (ref 3.4–10.8)

## 2019-07-11 NOTE — Telephone Encounter (Signed)
Called and spoke with pt about lab results. Advised labs fine per Dr. Felecia Shelling. She verbalized understanding.

## 2019-07-11 NOTE — Telephone Encounter (Signed)
-----   Message from Britt Bottom, MD sent at 07/11/2019 10:12 AM EDT ----- Please let the patient know that the lab work is fine.

## 2019-07-12 ENCOUNTER — Telehealth: Payer: Self-pay | Admitting: Neurology

## 2019-07-12 NOTE — Telephone Encounter (Signed)
[employer] auth: NPR via [employer] website order sent to GI. They will reach out to the patient to schedule.

## 2019-07-29 ENCOUNTER — Other Ambulatory Visit: Payer: Self-pay | Admitting: Neurology

## 2019-08-05 ENCOUNTER — Other Ambulatory Visit: Payer: Self-pay

## 2019-08-05 ENCOUNTER — Ambulatory Visit
Admission: RE | Admit: 2019-08-05 | Discharge: 2019-08-05 | Disposition: A | Discharge status: Home / Self Care | Payer: 59 | Source: Ambulatory Visit | Attending: Neurology | Admitting: Neurology

## 2019-08-05 DIAGNOSIS — G35 Multiple sclerosis: Secondary | ICD-10-CM

## 2019-08-05 MED ORDER — GADOBENATE DIMEGLUMINE 529 MG/ML IV SOLN
20.0000 mL | Freq: Once | INTRAVENOUS | Status: AC | PRN
  Administered 2019-08-05: 13:00:00 20 mL via INTRAVENOUS

## 2019-08-06 ENCOUNTER — Telehealth: Payer: Self-pay | Admitting: Neurology

## 2019-08-06 NOTE — Telephone Encounter (Signed)
I spoke with Beverly Beltran about the MRI.  It did show 1 new lesion that had not been present on the previous MRI though it did not appear to be acute.  It was located in the posterior left frontal lobe in the periventricular/deep white matter.  She does not recall having any right-sided symptoms over the last year  We discussed that with one new lesion we would continue with the Tecfidera.  However, if she has an exacerbation or if the next MRI shows 1 or more new lesions we would have to consider a switch to a more efficacious medication.

## 2019-08-09 ENCOUNTER — Other Ambulatory Visit: Payer: Self-pay | Admitting: Neurology

## 2019-08-29 ENCOUNTER — Other Ambulatory Visit: Payer: Self-pay | Admitting: Neurology

## 2019-11-08 ENCOUNTER — Telehealth: Payer: Self-pay | Admitting: *Deleted

## 2019-11-08 DIAGNOSIS — G35 Multiple sclerosis: Secondary | ICD-10-CM

## 2019-11-08 MED ORDER — DIMETHYL FUMARATE 240 MG PO CPDR: DELAYED_RELEASE_CAPSULE | ORAL | 11 refills | Status: DC

## 2019-11-08 NOTE — Telephone Encounter (Addendum)
Called pt because we received a fax stating PA not required for Tecfidera. She states her insurance will no longer cover it and needs to switch to generic. She took last dose of her brand Tecfidera last night. I recommended next time she call at least a 2-3 weeks prior to running out so we can get this resolved prior to her running out of medication. She is agreeable to switch to generic dimethyl fumarate 230m po BID. I escribed [facility] to [facility]. Advised pt that it may require a PA. Verified she has not had a change of insurance, same as last year.   I tried submitted PA on CMM. Received the following response: "Unable to locate member at this time. Please try again later." I called [employer] at [phone] Spoke with PEddie Dibbles I was routed to incorrect department. He asked me to call pharmacy line at [phone] This was for [employer] prescriber line. Spoke with [employer] They could not locate pt either by name/DOB/or address. They advised me to call [employer] back. I called back but could not get anyone on the line, it was silent.  I tried calling [facility] at [phone]  I was on hold for 25 min and unable to speak with representative.  I tried other # [phone] This was for [facility] PA department. Spoke with SRemo Lipps He ran two tests claims and they were paid. No PA needed.   I called pt back to let her know no PA needed. She should contact specialty pharmacy for refill. She should call me back if she has any issues getting this filled. She verbalized understanding.

## 2019-12-05 ENCOUNTER — Telehealth: Payer: Self-pay | Admitting: Obstetrics and Gynecology

## 2019-12-05 NOTE — Telephone Encounter (Signed)
Left message regarding upcoming appointment has been canceled and needs to be rescheduled.

## 2020-01-03 NOTE — Progress Notes (Signed)
49 y.o. [id number] Married White or Caucasian Not Hispanic or Latino female here for annual exam.  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 She then had a hysterectomy/BS with Dr Denman George. Final pathology was stage 1A1 cervical cancer.    No dyspareunia. No vaginal bleeding. Occasional night sweats, may be from her MS, no hot flashes or vaginal dryness.  Mild GSI, tolerable.   Patient's last menstrual period was 02/14/2017.          Sexually active: Yes.    The current method of family planning is status post hysterectomy.    Exercising: No.  The patient does not participate in regular exercise at present. Smoker:  no  Health Maintenance: Pap: 01/04/2019 Neg HR HPV Neg,  12/14/2017 WNL NEG HPV   12-08-16 ASCUS + HR HPV colposcopy CIN III LEEP   suspicious for invasive cervical cancer- sent to   GYN oncology- had hysterectomy History of abnormal Pap:  yes MMG:  11/29/17 Density B Bi-rads 1 neg  BMD:   Never  Colonoscopy: never, negative cologuard in 4/20. TDaP:  unsure Gardasil: NA   reports that she has never smoked. She has never used smokeless tobacco. She reports that she does not drink alcohol or use drugs. Homemaker, kids are 38 and 48 (son and daughter)  Past Medical History:  Diagnosis Date  . Anxiety   . Cancer ([place]) 01/2017   cervical cancer   . Depression   . Genital warts   . Hypercholesteremia   . Hypertension   . Multiple sclerosis ([place]) 01/2016  . Multiple sclerosis ([place])   . Numbness    occassional numbnes in in extremities  . Vision abnormalities   MS is stable, new lesion on MRI.   Past Surgical History:  Procedure Laterality Date  . ABDOMINAL HYSTERECTOMY    . CERVICAL BIOPSY  W/ LOOP ELECTRODE EXCISION    . COLPOSCOPY    . DILATION AND CURETTAGE OF UTERUS  1994  . ROBOTIC ASSISTED TOTAL HYSTERECTOMY WITH BILATERAL SALPINGO OOPHERECTOMY Bilateral 02/22/2017   Procedure: XI ROBOTIC ASSISTED TOTAL  HYSTERECTOMY WITH BILATERAL SALPINGECTOMY;  Surgeon: Everitt Amber, MD;  Location: [facility];  Service: Gynecology;  Laterality: Bilateral;  (no ovaries in pathology report)  Current Outpatient Medications  Medication Sig Dispense Refill  . allopurinol (ZYLOPRIM) 100 MG tablet Take 100 mg by mouth daily.    Marland Kitchen amLODIPine-Valsartan-HCTZ 10-320-25 MG TABS Take 1 tablet by mouth daily.    . cetirizine (ZYRTEC) 10 MG tablet Take 10 mg by mouth at bedtime.     . cholecalciferol (VITAMIN D) 1000 units tablet Take 1,000 Units by mouth daily.    . Cyanocobalamin (VITAMIN B-12) 2500 MCG SUBL Place 2,500 mcg under the tongue daily.    . cyclobenzaprine (FLEXERIL) 5 MG tablet TAKE 1 TABLET BY MOUTH EVERY DAY AT BEDTIME 30 tablet 11  . Dimethyl Fumarate (TECFIDERA) 240 MG CPDR TAKE ONE CAPSULE BY MOUTH TWICE DAILY. STORE IN ORIGINAL CONTAINER AT ROOM TEMPERATURE. 60 capsule 11  . fluticasone (FLONASE ALLERGY RELIEF) 50 MCG/ACT nasal spray Place 1 spray into both nostrils daily.    Marland Kitchen ibuprofen (ADVIL,MOTRIN) 600 MG tablet Take 1 tablet (600 mg total) by mouth every 6 (six) hours as needed. 30 tablet 0  . Lactobacillus (PROBIOTIC ACIDOPHILUS PO) Take 1 capsule by mouth at bedtime. 1 BILLION CFUs    . pravastatin (PRAVACHOL) 20 MG tablet Take 20 mg by mouth at bedtime.  3  . sertraline (ZOLOFT) 50 MG tablet TAKE 1 TABLET (50 MG) BY MOUTH DAILY AT NIGHT.  3   No current facility-administered medications for this visit.    Family History  Problem Relation Age of Onset  . Diabetes Mellitus II Mother   . COPD Father     Review of Systems  Constitutional: Negative.   HENT: Positive for postnasal drip and sneezing.   Eyes: Negative.   Respiratory: Negative.   Cardiovascular: Negative.   Gastrointestinal: Negative.   Endocrine: Positive for heat intolerance.  Genitourinary: Negative.   Musculoskeletal: Negative.   Skin: Negative.   Allergic/Immunologic: Positive for environmental allergies.   Hematological: Negative.   Psychiatric/Behavioral: Negative.     Exam:   LMP 02/14/2017   Weight change: @WEIGHTCHANGE @ Height:      Ht Readings from Last 3 Encounters:  07/10/19 5' 3"  (1.6 m)  01/04/19 5' 3"  (1.6 m)  09/06/18 5' 3"  (1.6 m)    General appearance: alert, cooperative and appears stated age Head: Normocephalic, without obvious abnormality, atraumatic Neck: no adenopathy, supple, symmetrical, trachea midline and thyroid normal to inspection and palpation Lungs: clear to auscultation bilaterally Cardiovascular: regular rate and rhythm Breasts: normal appearance, no masses or tenderness Abdomen: soft, non-tender; non distended,  no masses,  no organomegaly Extremities: extremities normal, atraumatic, no cyanosis or edema Skin: Skin color, texture, turgor normal. No rashes or lesions Lymph nodes: Cervical, supraclavicular, and axillary nodes normal. No abnormal inguinal nodes palpated Neurologic: Grossly normal   Pelvic: External genitalia:  no lesions              Urethra:  normal appearing urethra with no masses, tenderness or lesions              Bartholins and Skenes: normal                 Vagina: normal appearing vagina with normal color and discharge, no lesions              Cervix: absent               Bimanual Exam:  Uterus:  uterus absent              Adnexa: no mass, fullness, tenderness               Rectovaginal: Confirms               Anus:  normal sphincter tone, no lesions  Gae Dry chaperoned for the exam.  A:  Well Woman with normal exam  H/O cervical cancer  Multiple medical issues managed by primary MD  P:   Pap with hpv  Discussed breast self exam  Discussed calcium and vit D intake  Mammogram overdue  Cologuard UTD  Mammogram overdue   Labs with primary

## 2020-01-07 ENCOUNTER — Ambulatory Visit: Payer: 59 | Admitting: Obstetrics and Gynecology

## 2020-01-08 ENCOUNTER — Encounter: Payer: Self-pay | Admitting: Obstetrics and Gynecology

## 2020-01-08 ENCOUNTER — Other Ambulatory Visit (HOSPITAL_COMMUNITY)
Admission: RE | Admit: 2020-01-08 | Discharge: 2020-01-08 | Disposition: A | Discharge status: Home / Self Care | Payer: 59 | Source: Ambulatory Visit | Attending: Obstetrics and Gynecology | Admitting: Obstetrics and Gynecology

## 2020-01-08 ENCOUNTER — Other Ambulatory Visit: Payer: Self-pay

## 2020-01-08 ENCOUNTER — Ambulatory Visit: Payer: 59 | Admitting: Obstetrics and Gynecology

## 2020-01-08 VITALS — BP 110/60 | HR 91 | Temp 98.1°F | Ht 63.0 in | Wt 226.0 lb

## 2020-01-08 DIAGNOSIS — Z1272 Encounter for screening for malignant neoplasm of vagina: Secondary | ICD-10-CM

## 2020-01-08 DIAGNOSIS — Z8541 Personal history of malignant neoplasm of cervix uteri: Secondary | ICD-10-CM | POA: Diagnosis not present

## 2020-01-08 DIAGNOSIS — Z01419 Encounter for gynecological examination (general) (routine) without abnormal findings: Secondary | ICD-10-CM | POA: Diagnosis not present

## 2020-01-08 NOTE — Patient Instructions (Signed)
EXERCISE AND DIET:  We recommended that you start or continue a regular exercise program for good health. Regular exercise means any activity that makes your heart beat faster and makes you sweat.  We recommend exercising at least 30 minutes per day at least 3 days a week, preferably 4 or 5.  We also recommend a diet low in fat and sugar.  Inactivity, poor dietary choices and obesity can cause diabetes, heart attack, stroke, and kidney damage, among others.    ALCOHOL AND SMOKING:  Women should limit their alcohol intake to no more than 7 drinks/beers/glasses of wine (combined, not each!) per week. Moderation of alcohol intake to this level decreases your risk of breast cancer and liver damage. And of course, no recreational drugs are part of a healthy lifestyle.  And absolutely no smoking or even second hand smoke. Most people know smoking can cause heart and lung diseases, but did you know it also contributes to weakening of your bones? Aging of your skin?  Yellowing of your teeth and nails?  CALCIUM AND VITAMIN D:  Adequate intake of calcium and Vitamin D are recommended.  The recommendations for exact amounts of these supplements seem to change often, but generally speaking 1,000 mg of calcium (between diet and supplement) and 800 units of Vitamin D per day seems prudent. Certain women may benefit from higher intake of Vitamin D.  If you are among these women, your doctor will have told you during your visit.    PAP SMEARS:  Pap smears, to check for cervical cancer or precancers,  have traditionally been done yearly, although recent scientific advances have shown that most women can have pap smears less often.  However, every woman still should have a physical exam from her gynecologist every year. It will include a breast check, inspection of the vulva and vagina to check for abnormal growths or skin changes, a visual exam of the cervix, and then an exam to evaluate the size and shape of the uterus and  ovaries.  And after 48 years of age, a rectal exam is indicated to check for rectal cancers. We will also provide age appropriate advice regarding health maintenance, like when you should have certain vaccines, screening for sexually transmitted diseases, bone density testing, colonoscopy, mammograms, etc.   MAMMOGRAMS:  All women over 19 years old should have a yearly mammogram. Many facilities now offer a "3D" mammogram, which may cost around $50 extra out of pocket. If possible,  we recommend you accept the option to have the 3D mammogram performed.  It both reduces the number of women who will be called back for extra views which then turn out to be normal, and it is better than the routine mammogram at detecting truly abnormal areas.    COLON CANCER SCREENING: Now recommend starting at age 48. At this time colonoscopy is not covered for routine screening until 50. There are take home tests that can be done between 45-49.   COLONOSCOPY:  Colonoscopy to screen for colon cancer is recommended for all women at age 48.  We know, you hate the idea of the prep.  We agree, BUT, having colon cancer and not knowing it is worse!!  Colon cancer so often starts as a polyp that can be seen and removed at colonscopy, which can quite literally save your life!  And if your first colonoscopy is normal and you have no family history of colon cancer, most women don't have to have it again for  10 years.  Once every ten years, you can do something that may end up saving your life, right?  We will be happy to help you get it scheduled when you are ready.  Be sure to check your insurance coverage so you understand how much it will cost.  It may be covered as a preventative service at no cost, but you should check your particular policy.      Breast Self-Awareness Breast self-awareness means being familiar with how your breasts look and feel. It involves checking your breasts regularly and reporting any changes to your  health care provider. Practicing breast self-awareness is important. A change in your breasts can be a sign of a serious medical problem. Being familiar with how your breasts look and feel allows you to find any problems early, when treatment is more likely to be successful. All women should practice breast self-awareness, including women who have had breast implants. How to do a breast self-exam One way to learn what is normal for your breasts and whether your breasts are changing is to do a breast self-exam. To do a breast self-exam: Look for Changes  1. Remove all the clothing above your waist. 2. Stand in front of a mirror in a room with good lighting. 3. Put your hands on your hips. 4. Push your hands firmly downward. 5. Compare your breasts in the mirror. Look for differences between them (asymmetry), such as: ? Differences in shape. ? Differences in size. ? Puckers, dips, and bumps in one breast and not the other. 6. Look at each breast for changes in your skin, such as: ? Redness. ? Scaly areas. 7. Look for changes in your nipples, such as: ? Discharge. ? Bleeding. ? Dimpling. ? Redness. ? A change in position. Feel for Changes Carefully feel your breasts for lumps and changes. It is best to do this while lying on your back on the floor and again while sitting or standing in the shower or tub with soapy water on your skin. Feel each breast in the following way:  Place the arm on the side of the breast you are examining above your head.  Feel your breast with the other hand.  Start in the nipple area and make  inch (2 cm) overlapping circles to feel your breast. Use the pads of your three middle fingers to do this. Apply light pressure, then medium pressure, then firm pressure. The light pressure will allow you to feel the tissue closest to the skin. The medium pressure will allow you to feel the tissue that is a little deeper. The firm pressure will allow you to feel the tissue  close to the ribs.  Continue the overlapping circles, moving downward over the breast until you feel your ribs below your breast.  Move one finger-width toward the center of the body. Continue to use the  inch (2 cm) overlapping circles to feel your breast as you move slowly up toward your collarbone.  Continue the up and down exam using all three pressures until you reach your armpit.  Write Down What You Find  Write down what is normal for each breast and any changes that you find. Keep a written record with breast changes or normal findings for each breast. By writing this information down, you do not need to depend only on memory for size, tenderness, or location. Write down where you are in your menstrual cycle, if you are still menstruating. If you are having trouble noticing differences   in your breasts, do not get discouraged. With time you will become more familiar with the variations in your breasts and more comfortable with the exam. How often should I examine my breasts? Examine your breasts every month. If you are breastfeeding, the best time to examine your breasts is after a feeding or after using a breast pump. If you menstruate, the best time to examine your breasts is 5-7 days after your period is over. During your period, your breasts are lumpier, and it may be more difficult to notice changes. When should I see my health care provider? See your health care provider if you notice:  A change in shape or size of your breasts or nipples.  A change in the skin of your breast or nipples, such as a reddened or scaly area.  Unusual discharge from your nipples.  A lump or thick area that was not there before.  Pain in your breasts.  Anything that concerns you.  

## 2020-01-09 ENCOUNTER — Ambulatory Visit: Payer: 59 | Admitting: Neurology

## 2020-01-09 LAB — CYTOLOGY - PAP
Comment: NEGATIVE
Diagnosis: NEGATIVE
High risk HPV: NEGATIVE

## 2020-02-28 ENCOUNTER — Ambulatory Visit: Payer: 59 | Admitting: Neurology

## 2020-02-28 ENCOUNTER — Encounter: Payer: Self-pay | Admitting: Neurology

## 2020-02-28 ENCOUNTER — Other Ambulatory Visit: Payer: Self-pay

## 2020-02-28 VITALS — BP 138/78 | HR 89 | Temp 97.1°F | Ht 63.0 in | Wt 213.5 lb

## 2020-02-28 DIAGNOSIS — Z79899 Other long term (current) drug therapy: Secondary | ICD-10-CM

## 2020-02-28 DIAGNOSIS — G4700 Insomnia, unspecified: Secondary | ICD-10-CM

## 2020-02-28 DIAGNOSIS — R202 Paresthesia of skin: Secondary | ICD-10-CM | POA: Diagnosis not present

## 2020-02-28 DIAGNOSIS — G35 Multiple sclerosis: Secondary | ICD-10-CM | POA: Diagnosis not present

## 2020-02-28 NOTE — Progress Notes (Signed)
p  [facility]  PATIENT: Beverly Beltran DOB: 01-14-1971  REFERRING DOCTOR OR PCP:  Maurice Small SOURCE: Patient, ED and hospital notes in [place], labs and imaging reports in Epic, personal review of MRI images on PACS.  _________________________________   HISTORICAL  CHIEF COMPLAINT:  Chief Complaint  Patient presents with  . Follow-up    RM 12, alone. Last seen 07/10/2019. Received Pfizer covid-19 vaccine (01/11/20 and 02/01/20)  . Multiple Sclerosis    On dimethyl fumarate.   . Insomnia    Takes melatonin    HISTORY OF PRESENT ILLNESS:  Beverly Beltran is a 49 y.o. woman with MS.  Update 02/28/2020; She feels her MS is stable and she notes no exacerbations or new symptoms.  MRI 07/2019 showed one new lesion not present in 2021 She is on generic Tecfidra and tolerates it well.   She feels gait is stable.   Occasional balance is mildly off.  She denies weakness.    She has some hand numbness in her hands when she holds items a while.    Bladder function is fine.  She has more fatigue than last year.  She sleeps well most nights.   She has some sleep onset insomnia.   She does use electronics at night and set the night shift colors.      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  Pain increased some when her weight gained.        Update 07/10/2019: She is on Tecfidera and she tolerates it well.   She had been on Vumerity during the drug study but at the end we had insurance issues so we switched to Tecfidera.   She denies any exacerbations.   Gait, strength and sensation are doing well.  Arm numbness at the onset of her MS completely resolved.  Bladder function is fine.   Vision is correctable to 2o/20 and no color issues.  She denies fatigue.   She has some sleep onset insomnia.   Sometimes she notes some spasticity in her legs at night and occasional twitching and rare cramps in her side.   Mood is doing well.   She denies any major cognitive issues.  She  notes some word finding issues  Update 03/07/2019 (video) She is on Tecfidera for her MS and is tolerating it well.   She denies stomach issues and no recent flushing.    She has not had any exacerbations.   She denies new symptoms.   She is walking about the same and balance is fine.   She does not note numbness or weakness.   Vision has changed some but this seems correctable.    Bladder function is fine.       She notes some fatigue but this is not preventing her from doing activities.   She is not doing any exercise and just gets outdoors for a few minutes to take the dog out.     She takes vit D supplements.   She is sleeping well at night.  Cyclobenzaprine at night helps the insomnia.  Mood is okay.  She is on Zoloft for depression..   Cognition is doing well.   She is on allopurinol for gout.    Update 09/06/2018: She feels her MS has been stable.    She tolerates Tecfidera well   She has some flushing twice a month and it resolves with aspirin.  She has rare GERD but no other GI issue.  Her last MRI 04/19/2018 did not show any lesions.  Gait and strength are fine.   Balance and coordination are fine.   Sh will sometimes have some mild hand numbness but this is intermittent, often position related.    She has some urinary urgency and rare stress incontinence.   Vision is doing well.   She has new contacts.   She notes some difficulty with reading, better with mild readers.    She feels her fatigue is mild and tolerable.   She has more in the afternoons.   She has sleep onset insomnia but sleeps well once asleep.   Mood is doing well on sertraline.   Cognition is fine.   She had a hysterectomy May 2018 for cervical cancer FIGO Stage IA1  From 02/22/2016:   Around 01/28/2016, she had the onset of slurred speech and also felt more fatigue. Additionally, there was a dysesthetic numbness going into the left arm towards the thumb. She notes that she had just moved into a new house and had done  quite a bit of activity over the preceding couple of weeks when symptoms did not improve after couple days, she went to the emergency room. There, she was admitted. The initial CT scan was normal but MRI of the brain was consistent with MS. MRI of the spine did not show any MS lesions but did show significant degenerative changes at C5-C6 as well as T7-T8, T8-T9 and T9-T10..   She received 5 days of steroids and was discharged home.  By the third or fourth day of IV steroids, symptoms were much better.   She was discharged after 5 days of steroids.  Gait/strength/sensation: She denies any difficulty with gait. She could climb a ladder if she wanted to. She denies any significant difficulty with strength or sensation at this time.  She still gets some dysesthetic sensation down the left arm towards the thumb at times. This can occur with sitting and not necessarily with changes in position.  Bladder/bowel: She notes some urinary frequency and urgency but is not having incontinence. There is no hesitancy. She has nocturia x 2.  She does note some bowel constipation. Both of these issues have occurred for several years at least.  Vision: She notes that several years ago when she was in [place] she had visual blurring and was noted to have an abnormality and was having a procedure that sounds like OCT every 6 months to measure the nerve fibers.  Fatigue/sleep: She does note quite a bit of fatigue that is worse as the day goes on and worse with heat. She has some excessive daytime sleepiness and would doze off if she is resting and sometimes will watching TV and other activities. She notes insomnia with more difficulty falling than staying asleep.    Mood/cognition: She is on sertraline 50 mg for anxiety more than for depression.   This has been beneficial to her. She currently denies any cognitive issues but noted difficulty coming out with the right words in the midst of her exacerbation last month. This  has improved.  I personally reviewed the CT scan from 02/03/2016, MRI from 02/03/2016 of the brain and spine and the contrasted MRI of the brain from 02/04/2016 on PACS. MRI of the brain shows an enhancing large focus in the left frontal lobe, is also hyperintense on diffusion-weighted images. There are several other T2/FLAIR hyperintense foci in the periventricular, deep and juxtacortical white matter. The MRI of the cervical spine shows  degenerative changes at C5-C6 causing severe right and moderate left foraminal narrowing that could lead to right C6 nerve root compression there are milder degenerative changes at C3-C4 and C4-C5 with less potential for nerve root impingement.  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 The spinal cord had normal signal.  Notes from her emergency room and hospital visits from 02/03/2016 through 02/09/2016 were reviewed. She presented with several days of slurred speech and left arm numbness. He received IV Solu-Medrol and was discharged for 02/09/2016.   She had elevated glucose while on steroids.  REVIEW OF SYSTEMS: Constitutional: No fevers, chills, sweats, or change in appetite   she notes fatigue, insomnia and daytime hypersomnia Eyes: No visual changes, double vision, eye pain Ear, nose and throat: No hearing loss, ear pain, nasal congestion, sore throat Cardiovascular: No chest pain, palpitations Respiratory: No shortness of breath at rest or with exertion.   No wheezes.   She snores GastrointestinaI: No nausea, vomiting, diarrhea, abdominal pain, fecal incontinence.  Some constipation Genitourinary: No dysuria, urinary retention.  She has frequency and nocturia. Musculoskeletal: No neck pain, back pain Integumentary: No rash, pruritus, skin lesions Neurological: as above Psychiatric: No depression at this time.  No anxiety now (had in  past) Endocrine: No palpitations, diaphoresis, change in appetite, change in weigh or increased thirst Hematologic/Lymphatic: No anemia, purpura, petechiae. Allergic/Immunologic: No itchy/runny eyes, nasal congestion, recent allergic reactions, rashes  ALLERGIES: No Known Allergies  HOME MEDICATIONS:  Current Outpatient Medications:  .  allopurinol (ZYLOPRIM) 100 MG tablet, Take 100 mg by mouth daily., Disp: , Rfl:  .  amLODIPine-Valsartan-HCTZ 10-320-25 MG TABS, Take 1 tablet by mouth daily., Disp: , Rfl:  .  cetirizine (ZYRTEC) 10 MG tablet, Take 10 mg by mouth at bedtime. , Disp: , Rfl:  .  cyclobenzaprine (FLEXERIL) 5 MG tablet, TAKE 1 TABLET BY MOUTH EVERY DAY AT BEDTIME, Disp: 30 tablet, Rfl: 11 .  Dimethyl Fumarate (TECFIDERA) 240 MG CPDR, TAKE ONE CAPSULE BY MOUTH TWICE DAILY. STORE IN ORIGINAL CONTAINER AT ROOM TEMPERATURE., Disp: 60 capsule, Rfl: 11 .  fluticasone (FLONASE ALLERGY RELIEF) 50 MCG/ACT nasal spray, Place 1 spray into both nostrils daily., Disp: , Rfl:  .  ibuprofen (ADVIL,MOTRIN) 600 MG tablet, Take 1 tablet (600 mg total) by mouth every 6 (six) hours as needed., Disp: 30 tablet, Rfl: 0 .  Lactobacillus (PROBIOTIC ACIDOPHILUS PO), Take 1 capsule by mouth at bedtime. 1 BILLION CFUs, Disp: , Rfl:  .  Multiple Vitamin (MULTI VITAMIN DAILY PO), Take by mouth., Disp: , Rfl:  .  pravastatin (PRAVACHOL) 20 MG tablet, Take 20 mg by mouth at bedtime. , Disp: , Rfl: 3 .  sertraline (ZOLOFT) 50 MG tablet, TAKE 1 TABLET (50 MG) BY MOUTH DAILY AT NIGHT., Disp: , Rfl: 3  PAST MEDICAL HISTORY: Past Medical History:  Diagnosis Date  . Anxiety   . Cancer ([place]) 01/2017   cervical cancer   . Depression   . Genital warts   . Hypercholesteremia   . Hypertension   . Multiple sclerosis ([place]) 01/2016  . Multiple sclerosis ([place])   . Numbness    occassional numbnes in in extremities  . Vision abnormalities     PAST SURGICAL HISTORY: Past Surgical History:  Procedure  Laterality Date  . ABDOMINAL HYSTERECTOMY    . CERVICAL BIOPSY  W/ LOOP ELECTRODE EXCISION    . COLPOSCOPY    .  DILATION AND CURETTAGE OF UTERUS  1994  . ROBOTIC ASSISTED TOTAL HYSTERECTOMY WITH BILATERAL SALPINGO OOPHERECTOMY Bilateral 02/22/2017   Procedure: XI ROBOTIC ASSISTED TOTAL HYSTERECTOMY WITH BILATERAL SALPINGECTOMY;  Surgeon: Everitt Amber, MD;  Location: [facility];  Service: Gynecology;  Laterality: Bilateral;    FAMILY HISTORY: Family History  Problem Relation Age of Onset  . Diabetes Mellitus II Mother   . COPD Father     SOCIAL HISTORY:  Social History   Socioeconomic History  . Marital status: Married    Spouse name: Not on file  . Number of children: Not on file  . Years of education: Not on file  . Highest education level: Not on file  Occupational History  . Not on file  Tobacco Use  . Smoking status: Never Smoker  . Smokeless tobacco: Never Used  Substance and Sexual Activity  . Alcohol use: No  . Drug use: No  . Sexual activity: Yes    Partners: Male    Birth control/protection: Surgical    Comment: hysterectomy   Other Topics Concern  . Not on file  Social History Narrative  . Not on file   Social Determinants of Health   Financial Resource Strain:   . Difficulty of Paying Living Expenses:   Food Insecurity:   . Worried About Charity fundraiser in the Last Year:   . Arboriculturist in the Last Year:   Transportation Needs:   . Film/video editor (Medical):   Marland Kitchen Lack of Transportation (Non-Medical):   Physical Activity:   . Days of Exercise per Week:   . Minutes of Exercise per Session:   Stress:   . Feeling of Stress :   Social Connections:   . Frequency of Communication with Friends and Family:   . Frequency of Social Gatherings with Friends and Family:   . Attends Religious Services:   . Active Member of Clubs or Organizations:   . Attends Archivist Meetings:   Marland Kitchen Marital Status:   Intimate Partner Violence:   . Fear of  Current or Ex-Partner:   . Emotionally Abused:   Marland Kitchen Physically Abused:   . Sexually Abused:      PHYSICAL EXAM  Vitals:   02/28/20 1301  BP: 138/78  Pulse: 89  Temp: (!) 97.1 F (36.2 C)  Weight: 213 lb 8 oz (96.8 kg)  Height: 5' 3"  (1.6 m)    Body mass index is 37.82 kg/m.   General: The patient is well-developed and well-nourished and in no acute distress   Neurologic Exam  Mental status: The patient is alert and oriented x 3 at the time of the examination. The patient has apparent normal recent and remote memory, with an apparently normal attention span and concentration ability.   Speech is normal.  Cranial nerves: Extraocular movements are full.   Facial strength and sensation is normal.  Trapezius strength is normal.. No obvious hearing deficits.  Motor:  Muscle bulk is normal.   Tone is normal. Strength is  5 / 5 in all 4 extremities.   Sensory: Sensory testing is intact to touch, temperature and vibration.   No Tinels signs at wristss  Coordination: Cerebellar testing reveals good finger-nose-finger and heel-to-shin bilaterally.  Gait and station: Station is normal.  Gait is normal.   Tandem gait is mildly wide.   Romberg is negative.    Reflexes: Deep tendon reflexes are symmetric and normal bilaterally.       DIAGNOSTIC DATA (LABS,  IMAGING, TESTING) - I reviewed patient records, labs, notes, testing and imaging myself where available.  Lab Results  Component Value Date   WBC 6.7 07/10/2019   HGB 12.9 07/10/2019   HCT 39.6 07/10/2019   MCV 83 07/10/2019   PLT 220 07/10/2019      Component Value Date/Time   NA 134 (L) 02/23/2017 0426   K 4.4 02/23/2017 0426   CL 100 (L) 02/23/2017 0426   CO2 27 02/23/2017 0426   GLUCOSE 154 (H) 02/23/2017 0426   BUN 10 02/23/2017 0426   CREATININE 0.72 02/23/2017 0426   CALCIUM 9.3 02/23/2017 0426   PROT 8.3 (H) 02/18/2017 1351   ALBUMIN 4.5 02/18/2017 1351   AST 26 02/18/2017 1351   ALT 33 02/18/2017 1351    ALKPHOS 55 02/18/2017 1351   BILITOT 0.4 02/18/2017 1351   GFRNONAA >60 02/23/2017 0426   GFRAA >60 02/23/2017 0426   ___________________________________________________   Multiple sclerosis ([place]) - Plan: CBC with Differential/Platelet, Hepatic function panel  High risk medication use - Plan: CBC with Differential/Platelet, Hepatic function panel  Insomnia, unspecified type  Paresthesia  1.   Continue Tecfidera.  Check CBC with differential and LFT today.   Check MRi around time of next visit to determine if subclinical progression.   2.   Stay active and exercise as tolerated. 3.   Add melatonin at night to help with insomnia.  If this worsens consider a different medication. 4.   Return in 6 months or sooner if there are new or worsening neurologic symptoms.  Maximillian Habibi A. Felecia Shelling, MD, PhD 2/92/4462, 8:63 PM Certified in Neurology, Clinical Neurophysiology, Sleep Medicine, Pain Medicine and Neuroimaging  [facility] [address] [place], [place] [address] [phone]

## 2020-02-29 LAB — CBC WITH DIFFERENTIAL/PLATELET
Basophils Absolute: 0 10*3/uL (ref 0.0–0.2)
Basos: 0 %
EOS (ABSOLUTE): 0.2 10*3/uL (ref 0.0–0.4)
Eos: 2 %
Hematocrit: 39.7 % (ref 34.0–46.6)
Hemoglobin: 13 g/dL (ref 11.1–15.9)
Immature Grans (Abs): 0 10*3/uL (ref 0.0–0.1)
Immature Granulocytes: 0 %
Lymphocytes Absolute: 1.7 10*3/uL (ref 0.7–3.1)
Lymphs: 24 %
MCH: 26.5 pg — ABNORMAL LOW (ref 26.6–33.0)
MCHC: 32.7 g/dL (ref 31.5–35.7)
MCV: 81 fL (ref 79–97)
Monocytes Absolute: 0.4 10*3/uL (ref 0.1–0.9)
Monocytes: 6 %
Neutrophils Absolute: 4.8 10*3/uL (ref 1.4–7.0)
Neutrophils: 68 %
Platelets: 232 10*3/uL (ref 150–450)
RBC: 4.9 x10E6/uL (ref 3.77–5.28)
RDW: 14.3 % (ref 11.7–15.4)
WBC: 7 10*3/uL (ref 3.4–10.8)

## 2020-02-29 LAB — HEPATIC FUNCTION PANEL
ALT: 56 IU/L — ABNORMAL HIGH (ref 0–32)
AST: 41 IU/L — ABNORMAL HIGH (ref 0–40)
Albumin: 4.3 g/dL (ref 3.8–4.8)
Alkaline Phosphatase: 80 IU/L (ref 39–117)
Bilirubin Total: 0.4 mg/dL (ref 0.0–1.2)
Bilirubin, Direct: 0.13 mg/dL (ref 0.00–0.40)
Total Protein: 7.1 g/dL (ref 6.0–8.5)

## 2020-04-24 ENCOUNTER — Telehealth: Payer: Self-pay | Admitting: *Deleted

## 2020-04-24 NOTE — Telephone Encounter (Signed)
Faxed completed/signed PA dimethyl fumarate to [facility] at [phone]. Received fax confirmation. Waiting on determination.

## 2020-05-01 NOTE — Telephone Encounter (Signed)
Received fax notification from [facility] that [place] approved 04/30/20-04/30/21. [place] [id number].

## 2020-08-27 ENCOUNTER — Other Ambulatory Visit: Payer: Self-pay | Admitting: Neurology

## 2020-09-01 ENCOUNTER — Ambulatory Visit: Payer: 59 | Admitting: Family Medicine

## 2020-09-01 ENCOUNTER — Encounter: Payer: Self-pay | Admitting: Family Medicine

## 2020-09-01 VITALS — BP 141/83 | HR 78 | Ht 63.0 in | Wt 239.0 lb

## 2020-09-01 DIAGNOSIS — G4700 Insomnia, unspecified: Secondary | ICD-10-CM

## 2020-09-01 DIAGNOSIS — G35 Multiple sclerosis: Secondary | ICD-10-CM

## 2020-09-01 DIAGNOSIS — R5383 Other fatigue: Secondary | ICD-10-CM

## 2020-09-01 DIAGNOSIS — Z79899 Other long term (current) drug therapy: Secondary | ICD-10-CM | POA: Diagnosis not present

## 2020-09-01 NOTE — Progress Notes (Signed)
Chief Complaint  Patient presents with  . Follow-up    rm 1  . Multiple Sclerosis    pt said she is having no new sx or concerns.     HISTORY OF PRESENT ILLNESS: Today 09/01/2020  Beverly Beltran is a 49 y.o. female here today for follow up for  RRMS. She continues dimethyl fumerate. MRI 07/2019 showed focus in the posterior left frontal lobe not seen in 2019. Labs were unremarkable with exception of mildly elevated AST/ALT.   She feels that she is doing fairly well. No new or worsening symptoms. She continues cyclobenzaprine at bedtime that helps with muscle spasms and sleep. Gait stable. NO bowel or bladder changes. No vision changes.   Mood is stable on sertraline, managed by PCP. She was diagnosed Achilles tendonitis. She has gotten more supportive shoes and feels it is improving. She is sleeping well.    HISTORY (copied from previous note)  Beverly Beltran is a 49 y.o. woman with MS.  Update 02/28/2020; She feels her MS is stable and she notes no exacerbations or new symptoms.  MRI 07/2019 showed one new lesion not present in 2021 She is on generic Tecfidra and tolerates it well.   She feels gait is stable.   Occasional balance is mildly off.  She denies weakness.    She has some hand numbness in her hands when she holds items a while.    Bladder function is fine.  She has more fatigue than last year.  She sleeps well most nights.   She has some sleep onset insomnia.   She does use electronics at night and set the night shift colors.      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  Pain increased some when her weight gained.        Update 07/10/2019: She is on Tecfidera and she tolerates it well.   She had been on Vumerity during the drug study but at the end we had insurance issues so we switched to Tecfidera.   She denies any exacerbations.   Gait, strength and sensation are doing well.  Arm numbness at the onset of her MS completely resolved.  Bladder function is  fine.   Vision is correctable to 2o/20 and no color issues.  She denies fatigue.   She has some sleep onset insomnia.   Sometimes she notes some spasticity in her legs at night and occasional twitching and rare cramps in her side.   Mood is doing well.   She denies any major cognitive issues.  She notes some word finding issues  Update 03/07/2019 (video) She is on Tecfidera for her MS and is tolerating it well.   She denies stomach issues and no recent flushing.    She has not had any exacerbations.   She denies new symptoms.   She is walking about the same and balance is fine.   She does not note numbness or weakness.   Vision has changed some but this seems correctable.    Bladder function is fine.       She notes some fatigue but this is not preventing her from doing activities.   She is not doing any exercise and just gets outdoors for a few minutes to take the dog out.     She takes vit D supplements.   She is sleeping well at night.  Cyclobenzaprine at night helps the insomnia.  Mood is okay.  She is on  Zoloft for depression..   Cognition is doing well.   She is on allopurinol for gout.    Update 09/06/2018: She feels her MS has been stable.    She tolerates Tecfidera well   She has some flushing twice a month and it resolves with aspirin.  She has rare GERD but no other GI issue.     Her last MRI 04/19/2018 did not show any lesions.  Gait and strength are fine.   Balance and coordination are fine.   Sh will sometimes have some mild hand numbness but this is intermittent, often position related.    She has some urinary urgency and rare stress incontinence.   Vision is doing well.   She has new contacts.   She notes some difficulty with reading, better with mild readers.    She feels her fatigue is mild and tolerable.   She has more in the afternoons.   She has sleep onset insomnia but sleeps well once asleep.   Mood is doing well on sertraline.   Cognition is fine.   She had a  hysterectomy May 2018 for cervical cancer FIGO Stage IA1  From 02/22/2016:   Around 01/28/2016, she had the onset of slurred speech and also felt more fatigue. Additionally, there was a dysesthetic numbness going into the left arm towards the thumb. She notes that she had just moved into a new house and had done quite a bit of activity over the preceding couple of weeks when symptoms did not improve after couple days, she went to the emergency room. There, she was admitted. The initial CT scan was normal but MRI of the brain was consistent with MS. MRI of the spine did not show any MS lesions but did show significant degenerative changes at C5-C6 as well as T7-T8, T8-T9 and T9-T10..   She received 5 days of steroids and was discharged home.  By the third or fourth day of IV steroids, symptoms were much better.   She was discharged after 5 days of steroids.  Gait/strength/sensation: She denies any difficulty with gait. She could climb a ladder if she wanted to. She denies any significant difficulty with strength or sensation at this time.  She still gets some dysesthetic sensation down the left arm towards the thumb at times. This can occur with sitting and not necessarily with changes in position.  Bladder/bowel: She notes some urinary frequency and urgency but is not having incontinence. There is no hesitancy. She has nocturia x 2.  She does note some bowel constipation. Both of these issues have occurred for several years at least.  Vision: She notes that several years ago when she was in [place] she had visual blurring and was noted to have an abnormality and was having a procedure that sounds like OCT every 6 months to measure the nerve fibers.  Fatigue/sleep: She does note quite a bit of fatigue that is worse as the day goes on and worse with heat. She has some excessive daytime sleepiness and would doze off if she is resting and sometimes will watching TV and other activities. She notes  insomnia with more difficulty falling than staying asleep.    Mood/cognition: She is on sertraline 50 mg for anxiety more than for depression.   This has been beneficial to her. She currently denies any cognitive issues but noted difficulty coming out with the right words in the midst of her exacerbation last month. This has improved.  I personally reviewed the CT  scan from 02/03/2016, MRI from 02/03/2016 of the brain and spine and the contrasted MRI of the brain from 02/04/2016 on PACS. MRI of the brain shows an enhancing large focus in the left frontal lobe, is also hyperintense on diffusion-weighted images. There are several other T2/FLAIR hyperintense foci in the periventricular, deep and juxtacortical white matter. The MRI of the cervical spine shows degenerative changes at C5-C6 causing severe right and moderate left foraminal narrowing that could lead to right C6 nerve root compression there are milder degenerative changes at C3-C4 and C4-C5 with less potential for nerve root impingement.  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 The spinal cord had normal signal.  Notes from her emergency room and hospital visits from 02/03/2016 through 02/09/2016 were reviewed. She presented with several days of slurred speech and left arm numbness. He received IV Solu-Medrol and was discharged for 02/09/2016.   She had elevated glucose while on steroids.     REVIEW OF SYSTEMS: Out of a complete 14 system review of symptoms, the patient complains only of the following symptoms, muscle spasms, depression and all other reviewed systems are negative.   ALLERGIES: No Known Allergies   HOME MEDICATIONS: Outpatient Medications Prior to Visit  Medication Sig Dispense Refill  . allopurinol (ZYLOPRIM) 100 MG tablet Take 100 mg by mouth daily.    Marland Kitchen amLODIPine-Valsartan-HCTZ 10-320-25 MG TABS Take  1 tablet by mouth daily.    . cetirizine (ZYRTEC) 10 MG tablet Take 10 mg by mouth at bedtime.     . cyclobenzaprine (FLEXERIL) 5 MG tablet TAKE 1 TABLET BY MOUTH EVERY DAY AT BEDTIME 30 tablet 11  . Dimethyl Fumarate (TECFIDERA) 240 MG CPDR TAKE ONE CAPSULE BY MOUTH TWICE DAILY. STORE IN ORIGINAL CONTAINER AT ROOM TEMPERATURE. 60 capsule 11  . fluticasone (FLONASE ALLERGY RELIEF) 50 MCG/ACT nasal spray Place 1 spray into both nostrils daily.    Marland Kitchen ibuprofen (ADVIL,MOTRIN) 600 MG tablet Take 1 tablet (600 mg total) by mouth every 6 (six) hours as needed. 30 tablet 0  . Lactobacillus (PROBIOTIC ACIDOPHILUS PO) Take 1 capsule by mouth at bedtime. 1 BILLION CFUs    . Multiple Vitamin (MULTI VITAMIN DAILY PO) Take by mouth.    . pravastatin (PRAVACHOL) 20 MG tablet Take 20 mg by mouth at bedtime.   3  . sertraline (ZOLOFT) 50 MG tablet TAKE 1 TABLET (50 MG) BY MOUTH DAILY AT NIGHT.  3   No facility-administered medications prior to visit.     PAST MEDICAL HISTORY: Past Medical History:  Diagnosis Date  . Anxiety   . Cancer ([place]) 01/2017   cervical cancer   . Depression   . Genital warts   . Hypercholesteremia   . Hypertension   . Multiple sclerosis ([place]) 01/2016  . Multiple sclerosis ([place])   . Numbness    occassional numbnes in in extremities  . Vision abnormalities      PAST SURGICAL HISTORY: Past Surgical History:  Procedure Laterality Date  . ABDOMINAL HYSTERECTOMY    . CERVICAL BIOPSY  W/ LOOP ELECTRODE EXCISION    . COLPOSCOPY    . DILATION AND CURETTAGE OF UTERUS  1994  . ROBOTIC ASSISTED TOTAL HYSTERECTOMY WITH BILATERAL SALPINGO OOPHERECTOMY Bilateral 02/22/2017   Procedure: XI ROBOTIC ASSISTED TOTAL HYSTERECTOMY WITH BILATERAL SALPINGECTOMY;  Surgeon: Everitt Amber, MD;  Location: [facility];  Service: Gynecology;  Laterality: Bilateral;  FAMILY HISTORY: Family History  Problem Relation Age of Onset  . Diabetes Mellitus II Mother   . COPD Father      SOCIAL  HISTORY: Social History   Socioeconomic History  . Marital status: Married    Spouse name: Not on file  . Number of children: Not on file  . Years of education: Not on file  . Highest education level: Not on file  Occupational History  . Not on file  Tobacco Use  . Smoking status: Never Smoker  . Smokeless tobacco: Never Used  Vaping Use  . Vaping Use: Never used  Substance and Sexual Activity  . Alcohol use: No  . Drug use: No  . Sexual activity: Yes    Partners: Male    Birth control/protection: Surgical    Comment: hysterectomy   Other Topics Concern  . Not on file  Social History Narrative  . Not on file   Social Determinants of Health   Financial Resource Strain:   . Difficulty of Paying Living Expenses: Not on file  Food Insecurity:   . Worried About Charity fundraiser in the Last Year: Not on file  . Ran Out of Food in the Last Year: Not on file  Transportation Needs:   . Lack of Transportation (Medical): Not on file  . Lack of Transportation (Non-Medical): Not on file  Physical Activity:   . Days of Exercise per Week: Not on file  . Minutes of Exercise per Session: Not on file  Stress:   . Feeling of Stress : Not on file  Social Connections:   . Frequency of Communication with Friends and Family: Not on file  . Frequency of Social Gatherings with Friends and Family: Not on file  . Attends Religious Services: Not on file  . Active Member of Clubs or Organizations: Not on file  . Attends Archivist Meetings: Not on file  . Marital Status: Not on file  Intimate Partner Violence:   . Fear of Current or Ex-Partner: Not on file  . Emotionally Abused: Not on file  . Physically Abused: Not on file  . Sexually Abused: Not on file      PHYSICAL EXAM  Vitals:   09/01/20 1318  BP: (!) 141/83  Pulse: 78  Weight: 239 lb (108.4 kg)  Height: 5' 3"  (1.6 m)   Body mass index is 42.34 kg/m.   Generalized: Well developed, in no acute  distress  Cardiology: normal rate and rhythm, no murmur auscultated  Respiratory: clear to auscultation bilaterally    Neurological examination  Mentation: Alert oriented to time, place, history taking. Follows all commands speech and language fluent Cranial nerve II-XII: Pupils were equal round reactive to light. Extraocular movements were full, visual field were full on confrontational test. Facial sensation and strength were normal. Uvula tongue midline. Head turning and shoulder shrug  were normal and symmetric. Motor: The motor testing reveals 5 over 5 strength of all 4 extremities. Good symmetric motor tone is noted throughout.  Sensory: Sensory testing is intact to soft touch on all 4 extremities. No evidence of extinction is noted.  Coordination: Cerebellar testing reveals good finger-nose-finger and heel-to-shin bilaterally.  Gait and station: Gait is normal.  Reflexes: Deep tendon reflexes are symmetric and normal bilaterally.     DIAGNOSTIC DATA (LABS, IMAGING, TESTING) - I reviewed patient records, labs, notes, testing and imaging myself where available.  Lab Results  Component Value Date   WBC 7.6 09/01/2020  HGB 11.8 09/01/2020   HCT 35.5 09/01/2020   MCV 81 09/01/2020   PLT 232 09/01/2020      Component Value Date/Time   NA 134 (L) 02/23/2017 0426   K 4.4 02/23/2017 0426   CL 100 (L) 02/23/2017 0426   CO2 27 02/23/2017 0426   GLUCOSE 154 (H) 02/23/2017 0426   BUN 10 02/23/2017 0426   CREATININE 0.72 02/23/2017 0426   CALCIUM 9.3 02/23/2017 0426   PROT 7.4 09/01/2020 1350   ALBUMIN 4.3 09/01/2020 1350   AST 32 09/01/2020 1350   ALT 43 (H) 09/01/2020 1350   ALKPHOS 69 09/01/2020 1350   BILITOT 0.3 09/01/2020 1350   GFRNONAA >60 02/23/2017 0426   GFRAA >60 02/23/2017 0426   No results found for: CHOL, HDL, LDLCALC, LDLDIRECT, TRIG, CHOLHDL Lab Results  Component Value Date   HGBA1C 5.8 (H) 02/18/2017   No results found for: VITAMINB12 No results found  for: TSH    ASSESSMENT AND PLAN  49 y.o. year old female  has a past medical history of Anxiety, Cancer ([place]) (01/2017), Depression, Genital warts, Hypercholesteremia, Hypertension, Multiple sclerosis ([place]) (01/2016), Multiple sclerosis ([place]), Numbness, and Vision abnormalities. here with   Multiple sclerosis ([place]) - Plan: MR BRAIN W WO CONTRAST, CBC with Differential/Platelets, Hepatic Function Panel  High risk medication use - Plan: CBC with Differential/Platelets, Hepatic Function Panel  Insomnia, unspecified type  Other fatigue  Beverly Beltran is doing well today.  We will continue dimethyl fumarate and cyclobenzaprine as prescribed.  We will update labs today.  I will order repeat MRI for monitoring.  Healthy lifestyle habits encouraged.  She will continue to follow-up closely with primary care.  She will follow up with [place] in 6 months, sooner if needed.  She verbalizes understanding and agreement with this plan.  I spent 30 minutes of face-to-face and non-face-to-face time with patient.  This included previsit chart review, lab review, study review, order entry, electronic health record documentation, patient education.    Debbora Presto, MSN, FNP-C 09/02/2020, 10:56 AM  [facility] [address], [place], [place] [address] [phone]

## 2020-09-01 NOTE — Progress Notes (Signed)
I have read the note, and I agree with the clinical assessment and plan.  Shinita Mac A. Nia Nathaniel, MD, PhD, FAAN Certified in Neurology, Clinical Neurophysiology, Sleep Medicine, Pain Medicine and Neuroimaging  [facility] [address], [address] [place], [place] [address] [phone]  

## 2020-09-01 NOTE — Patient Instructions (Signed)
Below is our plan:  We will continue dimethyl fumerate. I will update labs today and will order MRI for monitoring.   Please make sure you are staying well hydrated. I recommend 50-60 ounces daily. Well balanced diet and regular exercise encouraged.    Please continue follow up with care team as directed.   Follow up with Dr Felecia Shelling in 6 months.   You may receive a survey regarding today's visit. I encourage you to leave honest feed back as I do use this information to improve patient care. Thank you for seeing me today!      Multiple Sclerosis Multiple sclerosis (MS) is a disease of the brain, spinal cord, and optic nerves (central nervous system). It causes the body's disease-fighting (immune) system to destroy the protective covering (myelin sheath) around nerves in the brain. When this happens, signals (nerve impulses) going to and from the brain and spinal cord do not get sent properly or may not get sent at all. There are several types of MS:  Relapsing-remitting MS. This is the most common type. This causes sudden attacks of symptoms. After an attack, you may recover completely until the next attack, or some symptoms may remain permanently.  Secondary progressive MS. This usually develops after the onset of relapsing-remitting MS. Similar to relapsing-remitting MS, this type also causes sudden attacks of symptoms. Attacks may be less frequent, but symptoms slowly get worse (progress) over time.  Primary progressive MS. This causes symptoms that steadily progress over time. This type of MS does not cause sudden attacks of symptoms. The age of onset of MS varies, but it often develops between 42-2 years of age. MS is a lifelong (chronic) condition. There is no cure, but treatment can help slow down the progression of the disease. What are the causes? The cause of this condition is not known. What increases the risk? You are more likely to develop this condition if:  You are a  woman.  You have a relative with MS. However, the condition is not passed from parent to child (inherited).  You have a lack (deficiency) of vitamin D.  You smoke. MS is more common in the [place] than in the [place]. What are the signs or symptoms? Relapsing-remitting and secondary progressive MS cause symptoms to occur in episodes or attacks that may last weeks to months. There may be long periods between attacks in which there are almost no symptoms. Primary progressive MS causes symptoms to steadily progress after they develop. Symptoms of MS vary because of the many different ways it affects the central nervous system. The main symptoms include:  Vision problems and eye pain.  Numbness.  Weakness.  Inability to move your arms, hands, feet, or legs (paralysis).  Balance problems.  Shaking that you cannot control (tremors).  Muscle spasms.  Problems with thinking (cognitive changes). MS can also cause symptoms that are associated with the disease, but are not always the direct result of an MS attack. They may include:  Inability to control urination or bowel movements (incontinence).  Headaches.  Fatigue.  Inability to tolerate heat.  Emotional changes.  Depression.  Pain. How is this diagnosed? This condition is diagnosed based on:  Your symptoms.  A neurological exam. This involves checking central nervous system function, such as nerve function, reflexes, and coordination.  MRIs of the brain and spinal cord.  Lab tests, including a lumbar puncture that tests the fluid that surrounds the brain and spinal cord (cerebrospinal fluid).  Tests to measure the electrical activity of the brain in response to stimulation (evoked potentials). How is this treated? There is no cure for MS, but medicines can help decrease the number and frequency of attacks and help relieve nuisance symptoms. Treatment options may include:  Medicines that  reduce the frequency of attacks. These medicines may be given by injection, by mouth (orally), or through an IV.  Medicines that reduce inflammation (steroids). These may provide short-term relief of symptoms.  Medicines to help control pain, depression, fatigue, or incontinence.  Vitamin D, if you have a deficiency.  Using devices to help you move around (assistive devices), such as braces, a cane, or a walker.  Physical therapy to strengthen and stretch your muscles.  Occupational therapy to help you with everyday tasks.  Alternative or complementary treatments such as exercise, massage, or acupuncture. Follow these instructions at home:  Take over-the-counter and prescription medicines only as told by your health care provider.  Do not drive or use heavy machinery while taking prescription pain medicine.  Use assistive devices as recommended by your physical therapist or your health care provider.  Exercise as directed by your health care provider.  Return to your normal activities as told by your health care provider. Ask your health care provider what activities are safe for you.  Reach out for support. Share your feelings with friends, family, or a support group.  Keep all follow-up visits as told by your health care provider and therapists. This is important. Where to find more information  National Multiple Sclerosis Society: https://www.nationalmssociety.org Contact a health care provider if:  You feel depressed.  You develop new pain or numbness.  You have tremors.  You have problems with sexual function. Get help right away if:  You develop paralysis.  You develop numbness.  You have problems with your bladder or bowel function.  You develop double vision.  You lose vision in one or both eyes.  You develop suicidal thoughts.  You develop severe confusion. If you ever feel like you may hurt yourself or others, or have thoughts about taking your own  life, get help right away. You can go to your nearest emergency department or call:  Your local emergency services (911 in the [place]  A suicide crisis helpline, such as the [place] at 442-874-4796. This is open 24 hours a day. Summary  Multiple sclerosis (MS) is a disease of the central nervous system that causes the body's immune system to destroy the protective covering (myelin sheath) around nerves in the brain.  There are 3 types of MS: relapsing-remitting, secondary progressive, and primary progressive. Relapsing-remitting and secondary progressive MS cause symptoms to occur in episodes or attacks that may last weeks to months. Primary progressive MS causes symptoms to steadily progress after they develop.  There is no cure for MS, but medicines can help decrease the number and frequency of attacks and help relieve nuisance symptoms. Treatment may also include physical or occupational therapy.  If you develop numbness, paralysis, vision problems, or other neurological symptoms, get help right away. This information is not intended to replace advice given to you by your health care provider. Make sure you discuss any questions you have with your health care provider. Document Revised: 09/16/2017 Document Reviewed: 12/13/2016 Elsevier Patient Education  2020 [employer].

## 2020-09-02 ENCOUNTER — Telehealth: Payer: Self-pay | Admitting: Family Medicine

## 2020-09-02 ENCOUNTER — Encounter: Payer: Self-pay | Admitting: Family Medicine

## 2020-09-02 LAB — CBC WITH DIFFERENTIAL/PLATELET
Basophils Absolute: 0 10*3/uL (ref 0.0–0.2)
Basos: 0 %
EOS (ABSOLUTE): 0.2 10*3/uL (ref 0.0–0.4)
Eos: 2 %
Hematocrit: 35.5 % (ref 34.0–46.6)
Hemoglobin: 11.8 g/dL (ref 11.1–15.9)
Immature Grans (Abs): 0 10*3/uL (ref 0.0–0.1)
Immature Granulocytes: 0 %
Lymphocytes Absolute: 1.5 10*3/uL (ref 0.7–3.1)
Lymphs: 20 %
MCH: 26.8 pg (ref 26.6–33.0)
MCHC: 33.2 g/dL (ref 31.5–35.7)
MCV: 81 fL (ref 79–97)
Monocytes Absolute: 0.4 10*3/uL (ref 0.1–0.9)
Monocytes: 6 %
Neutrophils Absolute: 5.5 10*3/uL (ref 1.4–7.0)
Neutrophils: 72 %
Platelets: 232 10*3/uL (ref 150–450)
RBC: 4.4 x10E6/uL (ref 3.77–5.28)
RDW: 13.8 % (ref 11.7–15.4)
WBC: 7.6 10*3/uL (ref 3.4–10.8)

## 2020-09-02 LAB — HEPATIC FUNCTION PANEL
ALT: 43 IU/L — ABNORMAL HIGH (ref 0–32)
AST: 32 IU/L (ref 0–40)
Albumin: 4.3 g/dL (ref 3.8–4.8)
Alkaline Phosphatase: 69 IU/L (ref 44–121)
Bilirubin Total: 0.3 mg/dL (ref 0.0–1.2)
Bilirubin, Direct: <0.1 mg/dL (ref 0.00–0.40)
Total Protein: 7.4 g/dL (ref 6.0–8.5)

## 2020-09-02 NOTE — Telephone Encounter (Signed)
[employer] auth: NPR via [employer] website order sent to GI. They will reach out to the patient to schedule.

## 2020-09-03 ENCOUNTER — Telehealth: Payer: Self-pay

## 2020-09-03 NOTE — Telephone Encounter (Signed)
  Result Notes   Debbora Presto, NP  09/02/2020 4:57 PM EST Back to Top    Labs look ok! One liver enzyme is still very mildly elevated but has improved. We will continue current treatment plan.    ---  Pt was contacted and notified of the message below. Pt had no additional questions or concerns.

## 2020-09-18 ENCOUNTER — Ambulatory Visit
Admission: RE | Admit: 2020-09-18 | Discharge: 2020-09-18 | Disposition: A | Discharge status: Home / Self Care | Payer: 59 | Source: Ambulatory Visit | Attending: Family Medicine | Admitting: Family Medicine

## 2020-09-18 DIAGNOSIS — G35 Multiple sclerosis: Secondary | ICD-10-CM

## 2020-09-18 MED ORDER — GADOBENATE DIMEGLUMINE 529 MG/ML IV SOLN
20.0000 mL | Freq: Once | INTRAVENOUS | Status: AC | PRN
  Administered 2020-09-18: 20 mL via INTRAVENOUS

## 2020-09-22 ENCOUNTER — Telehealth: Payer: Self-pay | Admitting: Family Medicine

## 2020-09-22 NOTE — Telephone Encounter (Signed)
Pt called back, she accepted the 9:00 appointment on 12-08, she was made aware to check in at 8:30

## 2020-09-22 NOTE — Telephone Encounter (Signed)
Called, LVM for pt relaying results per Amy message. Offered appt for her to come in to see Dr. Felecia Shelling on 09/24/20 at [facility]. Asked her to call back today or tomorrow to let us know if this works for her.

## 2020-09-22 NOTE — Telephone Encounter (Signed)
Pt called back and accepted 9am appt on 09/24/20. Phone room advised her to check in by 8:30am.

## 2020-09-22 NOTE — Telephone Encounter (Signed)
-----   Message from Britt Bottom, MD sent at 09/20/2020 11:47 AM EST ----- Regarding: MRI Felix Pratt, I read Ms. Urbani MRI this weekend. It showed 1 focus that was not present on the previous MRI so she is having some breakthrough activity. Her previous MRI also showed 1 new focus. Therefore, with mild breakthrough activity, we should consider a different disease modifying therapy. I would be happy to try to get her in in the next couple weeks to discuss options with her.  Richard

## 2020-09-24 ENCOUNTER — Ambulatory Visit: Payer: 59 | Admitting: Neurology

## 2020-09-24 ENCOUNTER — Encounter: Payer: Self-pay | Admitting: Neurology

## 2020-09-24 ENCOUNTER — Telehealth: Payer: Self-pay | Admitting: *Deleted

## 2020-09-24 VITALS — BP 118/68 | HR 82 | Ht 63.0 in | Wt 231.0 lb

## 2020-09-24 DIAGNOSIS — Z79899 Other long term (current) drug therapy: Secondary | ICD-10-CM

## 2020-09-24 DIAGNOSIS — G4700 Insomnia, unspecified: Secondary | ICD-10-CM | POA: Diagnosis not present

## 2020-09-24 DIAGNOSIS — R5383 Other fatigue: Secondary | ICD-10-CM

## 2020-09-24 DIAGNOSIS — G35 Multiple sclerosis: Secondary | ICD-10-CM

## 2020-09-24 NOTE — Progress Notes (Addendum)
p  [facility]  PATIENT: Beverly Beltran DOB: 29-May-1971  REFERRING DOCTOR OR PCP:  Maurice Small SOURCE: Patient, ED and hospital notes in [place], labs and imaging reports in Epic, personal review of MRI images on PACS.  _________________________________   HISTORICAL  CHIEF COMPLAINT:  Chief Complaint  Patient presents with  . Follow-up    RM 13, alone.  Last seen 09/01/20 by AL,NP. Here to discuss changing DMT d/t disease progression found on MRI. Currently on dimethyl fumarate.     HISTORY OF PRESENT ILLNESS:  Beverly Beltran is a 49 y.o. woman with relapsing remitting MS.  Update 09/24/2020 Beverly Beltran comes in today because she has had some breakthrough activity on her last 2 brain MRIs.  Specifically, there was one new lesion in the left frontal lobe on the MRI from 08/05/2019.  There was another new focus in the periventricular white matter of the anterior right frontal lobe on the MRI from 09/20/2020.  Neither focus enhanced.  However, they imply breakthrough activity.  Because of this, I recommend that we switch to a more efficacious medication.  In detail, we discussed Mavenclad, S1 P receptor modulators (Gilenya, Zeposia, Mayzent), Tysabri and Ocrevus/Kesimpta.  She is most interested in LaCoste and we will check appropriate lab work.  I also let her know we are participating in an observational study with [facility] and she is willing to do this as well. I read Beverly Beltran MRI this weekend. It showed 1 focus that was not present on the previous MRI so she is having some breakthrough activity. Her previous MRI also showed 1 new focus. Therefore, with mild breakthrough activity, we should consider a different disease modifying therapy. I would be happy to try to get her in in the next couple weeks to discuss options with her.   I spoke with Beverly Beltran about the MRI.  It did show 1 new lesion that had not been present on the previous MRI though it did not appear  to be acute.  It was located in the posterior left frontal lobe in the periventricular/deep white matter.  She does not recall having any right-sided symptoms over the last year  We discussed that with one new lesion we would continue with the Tecfidera.  However, if she has an exacerbation or if the next MRI shows 1 or more new lesions we would have to consider a switch to a more efficacious medication.   She has been stable on the Tecfidera.  Specifically she notes no new numbness, weakness, gait disturbance, difficulties with balance, bladder changes.  Fatigue is tolerable.  She has some insomnia.  Mood and cognition are fine.  She had a hysterectomy May 2018 for cervical cancer FIGO Stage IA1  MS history: Around 01/28/2016, she had the onset of slurred speech and also felt more fatigue. Additionally, there was a dysesthetic numbness going into the left arm towards the thumb. She notes that she had just moved into a new house and had done quite a bit of activity over the preceding couple of weeks when symptoms did not improve after couple days, she went to the emergency room. There, she was admitted. The initial CT scan was normal but MRI of the brain was consistent with MS. MRI of the spine did not show any MS lesions but did show significant degenerative changes at C5-C6 as well as T7-T8, T8-T9 and T9-T10..   She received 5 days of steroids and was discharged home.  By  the third or fourth day of IV steroids, symptoms were much better.   She was discharged after 5 days of steroids.  She entered the ALKS-8700 evolve study (comparison with Tecfidera for 5 weeks) and the open label extension for the next 2 years.  Due to insurance issues, Vumerity was not available to her and she switched to Tecfidera in 2019.  MRI of the brain in 2020 showed 1 breakthrough lesion.  MRI of the brain in December 2021 showed a second breakthrough lesion.  After discussion of options, she opted to switch to [facility]  December 2021  Imaging review:  MRI of the brain 02/03/2016 without contrast and 02/04/2016 with contrast show an enhancing large focus in the left frontal lobe, is also hyperintense on diffusion-weighted images. There are several other T2/FLAIR hyperintense foci in the periventricular, deep and juxtacortical white matter.   The MRI of the cervical spine 02/03/2016 showed normal spinal cord and shows degenerative changes at C5-C6 causing severe right and moderate left foraminal narrowing that could lead to right C6 nerve root compression there are milder degenerative changes at C3-C4 and C4-C5 with less potential for nerve root impingement.    MRI of the thoracic spine 02/03/2016 showed central disc extrusions at T7-T8, T8-T9 and T9-T10 deforming the spinal cord was in mild spinal stenosis of these levels. The spinal cord had normal signal.  MRI of the brain 04/22/2016, 06/09/2016, 05/11/2017 and 04/18/2018 with the drug study protocol showed no new lesions.  MRI of the brain 08/05/2019 shows multiple T2/FLAIR hyperintense foci in the periventricular, juxtacortical and deep white matter in a pattern and configuration consistent with chronic demyelinating plaque associated with multiple sclerosis.  None of these foci enhance or appear to be acute though one focus in the posterior left frontal lobe was not present on the MRI from 04/18/2018 and represents some disease activity.  MRI of the brain 09/20/2020 shows T2/FLAIR hyperintense foci in the cerebral hemispheres in a pattern and configuration consistent with chronic demyelinating plaque associated with multiple sclerosis. None of the foci were acute or enhance. However, one focus in the right frontal lobe seen on the current MRI was not present on the 08/05/2019 MRI and likely represents interval MS activity.   REVIEW OF SYSTEMS: Constitutional: No fevers, chills, sweats, or change in appetite   she notes fatigue, insomnia and daytime hypersomnia Eyes: No  visual changes, double vision, eye pain Ear, nose and throat: No hearing loss, ear pain, nasal congestion, sore throat Cardiovascular: No chest pain, palpitations Respiratory: No shortness of breath at rest or with exertion.   No wheezes.   She snores GastrointestinaI: No nausea, vomiting, diarrhea, abdominal pain, fecal incontinence.  Some constipation Genitourinary: No dysuria, urinary retention.  She has frequency and nocturia. Musculoskeletal: No neck pain, back pain Integumentary: No rash, pruritus, skin lesions Neurological: as above Psychiatric: No depression at this time.  No anxiety now (had in past) Endocrine: No palpitations, diaphoresis, change in appetite, change in weigh or increased thirst Hematologic/Lymphatic: No anemia, purpura, petechiae. Allergic/Immunologic: No itchy/runny eyes, nasal congestion, recent allergic reactions, rashes  ALLERGIES: No Known Allergies  HOME MEDICATIONS:  Current Outpatient Medications:  .  allopurinol (ZYLOPRIM) 100 MG tablet, Take 100 mg by mouth daily., Disp: , Rfl:  .  amLODIPine-Valsartan-HCTZ 10-320-25 MG TABS, Take 1 tablet by mouth daily., Disp: , Rfl:  .  cetirizine (ZYRTEC) 10 MG tablet, Take 10 mg by mouth at bedtime. , Disp: , Rfl:  .  cyclobenzaprine (FLEXERIL) 5 MG  tablet, TAKE 1 TABLET BY MOUTH EVERY DAY AT BEDTIME, Disp: 30 tablet, Rfl: 11 .  Dimethyl Fumarate (TECFIDERA) 240 MG CPDR, TAKE ONE CAPSULE BY MOUTH TWICE DAILY. STORE IN ORIGINAL CONTAINER AT ROOM TEMPERATURE., Disp: 60 capsule, Rfl: 11 .  fluticasone (FLONASE ALLERGY RELIEF) 50 MCG/ACT nasal spray, Place 1 spray into both nostrils daily., Disp: , Rfl:  .  ibuprofen (ADVIL,MOTRIN) 600 MG tablet, Take 1 tablet (600 mg total) by mouth every 6 (six) hours as needed., Disp: 30 tablet, Rfl: 0 .  Lactobacillus (PROBIOTIC ACIDOPHILUS PO), Take 1 capsule by mouth at bedtime. 1 BILLION CFUs, Disp: , Rfl:  .  Multiple Vitamin (MULTI VITAMIN DAILY PO), Take by mouth., Disp:  , Rfl:  .  pravastatin (PRAVACHOL) 20 MG tablet, Take 20 mg by mouth at bedtime. , Disp: , Rfl: 3 .  sertraline (ZOLOFT) 50 MG tablet, TAKE 1 TABLET (50 MG) BY MOUTH DAILY AT NIGHT., Disp: , Rfl: 3  PAST MEDICAL HISTORY: Past Medical History:  Diagnosis Date  . Anxiety   . Cancer ([place]) 01/2017   cervical cancer   . Depression   . Genital warts   . Hypercholesteremia   . Hypertension   . Multiple sclerosis ([place]) 01/2016  . Multiple sclerosis ([place])   . Numbness    occassional numbnes in in extremities  . Vision abnormalities     PAST SURGICAL HISTORY: Past Surgical History:  Procedure Laterality Date  . ABDOMINAL HYSTERECTOMY    . CERVICAL BIOPSY  W/ LOOP ELECTRODE EXCISION    . COLPOSCOPY    . DILATION AND CURETTAGE OF UTERUS  1994  . ROBOTIC ASSISTED TOTAL HYSTERECTOMY WITH BILATERAL SALPINGO OOPHERECTOMY Bilateral 02/22/2017   Procedure: XI ROBOTIC ASSISTED TOTAL HYSTERECTOMY WITH BILATERAL SALPINGECTOMY;  Surgeon: Everitt Amber, MD;  Location: [facility];  Service: Gynecology;  Laterality: Bilateral;    FAMILY HISTORY: Family History  Problem Relation Age of Onset  . Diabetes Mellitus II Mother   . COPD Father     SOCIAL HISTORY:  Social History   Socioeconomic History  . Marital status: Married    Spouse name: Not on file  . Number of children: Not on file  . Years of education: Not on file  . Highest education level: Not on file  Occupational History  . Not on file  Tobacco Use  . Smoking status: Never Smoker  . Smokeless tobacco: Never Used  Vaping Use  . Vaping Use: Never used  Substance and Sexual Activity  . Alcohol use: No  . Drug use: No  . Sexual activity: Yes    Partners: Male    Birth control/protection: Surgical    Comment: hysterectomy   Other Topics Concern  . Not on file  Social History Narrative  . Not on file   Social Determinants of Health   Financial Resource Strain:   . Difficulty of Paying Living Expenses: Not on file  Food  Insecurity:   . Worried About Charity fundraiser in the Last Year: Not on file  . Ran Out of Food in the Last Year: Not on file  Transportation Needs:   . Lack of Transportation (Medical): Not on file  . Lack of Transportation (Non-Medical): Not on file  Physical Activity:   . Days of Exercise per Week: Not on file  . Minutes of Exercise per Session: Not on file  Stress:   . Feeling of Stress : Not on file  Social Connections:   . Frequency of Communication with  Friends and Family: Not on file  . Frequency of Social Gatherings with Friends and Family: Not on file  . Attends Religious Services: Not on file  . Active Member of Clubs or Organizations: Not on file  . Attends Archivist Meetings: Not on file  . Marital Status: Not on file  Intimate Partner Violence:   . Fear of Current or Ex-Partner: Not on file  . Emotionally Abused: Not on file  . Physically Abused: Not on file  . Sexually Abused: Not on file     PHYSICAL EXAM  Vitals:   09/24/20 0835  BP: 118/68  Pulse: 82  Weight: 231 lb (104.8 kg)  Height: 5' 3"  (1.6 m)    Body mass index is 40.92 kg/m.  VISUAL Acuity:  20/25 OS  20/30 OD  General: The patient is well-developed and well-nourished and in no acute distress   Neurologic Exam  Mental status: The patient is alert and oriented x 3 at the time of the examination. The patient has apparent normal recent and remote memory, with an apparently normal attention span and concentration ability.   Speech is normal.  Cranial nerves: Extraocular movements are full.   Facial strength and sensation is normal.  Trapezius strength is normal.. No obvious hearing deficits.  Motor:  Muscle bulk is normal.   Tone is normal. Strength is  5 / 5 in all 4 extremities.   Sensory: Sensory testing is intact to touch, temperature and vibration.   Coordination: Cerebellar testing reveals good finger-nose-finger and heel-to-shin bilaterally.  Gait and station: Station  is normal.   The gait is normal.  Her tandem gait is mildly wide.  Romberg is negative..   Reflexes: Deep tendon reflexes are symmetric and normal bilaterally.     EDSS:  2.0    (Subscale scores  Visual 2,  Cerebellar 1 others 0),   Ambulation 0 unlimited  25 foot timed walk 6.3 sec and 6.7 sec    DIAGNOSTIC DATA (LABS, IMAGING, TESTING) - I reviewed patient records, labs, notes, testing and imaging myself where available.  Lab Results  Component Value Date   WBC 7.6 09/01/2020   HGB 11.8 09/01/2020   HCT 35.5 09/01/2020   MCV 81 09/01/2020   PLT 232 09/01/2020      Component Value Date/Time   NA 134 (L) 02/23/2017 0426   K 4.4 02/23/2017 0426   CL 100 (L) 02/23/2017 0426   CO2 27 02/23/2017 0426   GLUCOSE 154 (H) 02/23/2017 0426   BUN 10 02/23/2017 0426   CREATININE 0.72 02/23/2017 0426   CALCIUM 9.3 02/23/2017 0426   PROT 7.4 09/01/2020 1350   ALBUMIN 4.3 09/01/2020 1350   AST 32 09/01/2020 1350   ALT 43 (H) 09/01/2020 1350   ALKPHOS 69 09/01/2020 1350   BILITOT 0.3 09/01/2020 1350   GFRNONAA >60 02/23/2017 0426   GFRAA >60 02/23/2017 0426   ___________________________________________________   Multiple sclerosis (HCC) - Plan: HIV Antibody (routine testing w rflx), Hepatitis B core antibody, total, Hepatitis B surface antigen, QuantiFERON-TB Gold Plus, Varicella zoster antibody, IgG, Hepatitis B surface antibody,qualitative, Hepatitis C antibody, Stratify JCV Antibody Test ([facility]), Other/Misc lab test  High risk medication use - Plan: HIV Antibody (routine testing w rflx), Hepatitis B core antibody, total, Hepatitis B surface antigen, QuantiFERON-TB Gold Plus, Varicella zoster antibody, IgG, Hepatitis B surface antibody,qualitative, Hepatitis C antibody, Stratify JCV Antibody Test ([facility]), Other/Misc lab test  Other fatigue  Insomnia, unspecified type  1.  She will  switch to [facility].  I discussed the [place] study and she would like to participate.    2.   Stay active and exercise as tolerated.   3.   Return in 6 months or sooner if there are new or worsening neurologic symptoms.  Zackerie Sara A. Felecia Shelling, MD, PhD 67/11/945, 09:62 AM Certified in Neurology, Clinical Neurophysiology, Sleep Medicine, Pain Medicine and Neuroimaging  [facility] [address], [place], [place] [address] [phone]

## 2020-09-24 NOTE — Telephone Encounter (Signed)
JCV ab lab not drawn by lab today. Per Dr. Felecia Shelling, not needed since pt going on [facility].

## 2020-09-25 ENCOUNTER — Other Ambulatory Visit: Payer: 59

## 2020-09-27 LAB — QUANTIFERON-TB GOLD PLUS
QuantiFERON Mitogen Value: 3.02 IU/mL
QuantiFERON Nil Value: 0.04 IU/mL
QuantiFERON TB1 Ag Value: 0.06 IU/mL
QuantiFERON TB2 Ag Value: 0.05 IU/mL
QuantiFERON-TB Gold Plus: NEGATIVE

## 2020-09-27 LAB — VARICELLA ZOSTER ANTIBODY, IGG: Varicella zoster IgG: 2994 index (ref >165)

## 2020-09-27 LAB — HEPATITIS B SURFACE ANTIBODY,QUALITATIVE: Hep B Surface Ab, Qual: NONREACTIVE

## 2020-09-27 LAB — HEPATITIS C ANTIBODY: Hep C Virus Ab: <0.1 s/co ratio (ref 0.0–0.9)

## 2020-09-27 LAB — HEPATITIS B SURFACE ANTIGEN: Hepatitis B Surface Ag: NEGATIVE

## 2020-09-27 LAB — HEPATITIS B CORE ANTIBODY, TOTAL: Hep B Core Total Ab: NEGATIVE

## 2020-09-27 LAB — HIV ANTIBODY (ROUTINE TESTING W REFLEX): HIV Screen 4th Generation wRfx: NONREACTIVE

## 2020-10-06 ENCOUNTER — Telehealth: Payer: Self-pay

## 2020-10-06 NOTE — Telephone Encounter (Signed)
Faxed Mavenclad start form to Beverly Beltran. Received a receipt of confirmation.

## 2020-10-13 ENCOUNTER — Other Ambulatory Visit: Payer: Self-pay | Admitting: Neurology

## 2020-10-13 DIAGNOSIS — G35 Multiple sclerosis: Secondary | ICD-10-CM

## 2020-10-18 ENCOUNTER — Emergency Department (HOSPITAL_BASED_OUTPATIENT_CLINIC_OR_DEPARTMENT_OTHER)
Admission: EM | Admit: 2020-10-18 | Discharge: 2020-10-18 | Disposition: A | Discharge status: Home / Self Care | Payer: 59 | Attending: Emergency Medicine | Admitting: Emergency Medicine

## 2020-10-18 ENCOUNTER — Other Ambulatory Visit: Payer: Self-pay

## 2020-10-18 ENCOUNTER — Encounter (HOSPITAL_BASED_OUTPATIENT_CLINIC_OR_DEPARTMENT_OTHER): Payer: Self-pay | Admitting: Emergency Medicine

## 2020-10-18 DIAGNOSIS — L02212 Cutaneous abscess of back [any part, except buttock]: Secondary | ICD-10-CM | POA: Insufficient documentation

## 2020-10-18 DIAGNOSIS — Z8541 Personal history of malignant neoplasm of cervix uteri: Secondary | ICD-10-CM | POA: Insufficient documentation

## 2020-10-18 DIAGNOSIS — R509 Fever, unspecified: Secondary | ICD-10-CM | POA: Diagnosis not present

## 2020-10-18 DIAGNOSIS — I10 Essential (primary) hypertension: Secondary | ICD-10-CM | POA: Diagnosis not present

## 2020-10-18 DIAGNOSIS — Z79899 Other long term (current) drug therapy: Secondary | ICD-10-CM | POA: Insufficient documentation

## 2020-10-18 DIAGNOSIS — L0291 Cutaneous abscess, unspecified: Secondary | ICD-10-CM

## 2020-10-18 MED ORDER — ONDANSETRON 4 MG PO TBDP
4.0000 mg | ORAL_TABLET | Freq: Once | ORAL | Status: AC
  Administered 2020-10-18: 4 mg via ORAL
  Filled 2020-10-18: qty 1

## 2020-10-18 MED ORDER — DOXYCYCLINE HYCLATE 100 MG PO TABS
100.0000 mg | ORAL_TABLET | Freq: Once | ORAL | Status: AC
  Administered 2020-10-18: 100 mg via ORAL
  Filled 2020-10-18: qty 1

## 2020-10-18 MED ORDER — LIDOCAINE-EPINEPHRINE (PF) 2 %-1:200000 IJ SOLN
10.0000 mL | Freq: Once | INTRAMUSCULAR | Status: AC
  Administered 2020-10-18: 10 mL
  Filled 2020-10-18: qty 20

## 2020-10-18 MED ORDER — DOXYCYCLINE HYCLATE 100 MG PO CAPS: 100.0000 mg | ORAL_CAPSULE | Freq: Two times a day (BID) | ORAL | 0 refills | Status: DC

## 2020-10-18 NOTE — ED Triage Notes (Signed)
Reports having a cyst in the upper central back for 15 years or more.  For the last week or so it has been red, swollen and painful.

## 2020-10-18 NOTE — ED Provider Notes (Signed)
[facility] EMERGENCY DEPARTMENT Provider Note   CSN: [id number] Arrival date & time: 10/18/20  1523     History Chief Complaint  Patient presents with  . Abscess    Beverly Beltran is a 50 y.o. female with a past medical history significant for hypertension, MS, hyperlipidemia, depression, history of cervical cancer, and anxiety who presents to the ED due to an abscess.  Patient states she has had a cyst in her upper central back for 15 years however over the past few weeks the area has enlarged and become tender to touch.  Patient states she started noticing erythema surrounding the cyst a few days ago. Mild drainage from site. She admits to having a fever of 101.3 F on Wednesday.  No further fevers.  Denies chills. No treatment prior to arrival.   History obtained from patient and past medical records. No interpreter used during encounter.      Past Medical History:  Diagnosis Date  . Anxiety   . Cancer ([place]) 01/2017   cervical cancer   . Depression   . Genital warts   . Hypercholesteremia   . Hypertension   . Multiple sclerosis ([place]) 01/2016  . Multiple sclerosis ([place])   . Numbness    occassional numbnes in in extremities  . Vision abnormalities     Patient Active Problem List   Diagnosis Date Noted  . High risk medication use 09/06/2018  . Cervical cancer ([place]) 02/22/2017  . Cervical cancer, FIGO stage IA1 ([place]) 01/31/2017  . Low serum vitamin D 02/23/2016  . Other fatigue 02/23/2016  . Insomnia 02/23/2016  . Multiple sclerosis ([place])   . Spinal stenosis in cervical region 02/04/2016  . Hypokalemia 02/04/2016  . Other allergic rhinitis   . Aphasia 02/03/2016  . Paresthesia 02/03/2016  . Hypertension 02/03/2016  . Hyperlipidemia 02/03/2016  . Acquired CNS lesion 02/03/2016    Past Surgical History:  Procedure Laterality Date  . ABDOMINAL HYSTERECTOMY    . CERVICAL BIOPSY  W/ LOOP ELECTRODE EXCISION    . COLPOSCOPY    . DILATION AND CURETTAGE OF  UTERUS  1994  . ROBOTIC ASSISTED TOTAL HYSTERECTOMY WITH BILATERAL SALPINGO OOPHERECTOMY Bilateral 02/22/2017   Procedure: XI ROBOTIC ASSISTED TOTAL HYSTERECTOMY WITH BILATERAL SALPINGECTOMY;  Surgeon: Everitt Amber, MD;  Location: [facility];  Service: Gynecology;  Laterality: Bilateral;     OB History    Gravida  3   Para  2   Term  2   Preterm  0   AB  1   Living  2     SAB  0   IAB  0   Ectopic  0   Multiple  0   Live Births  0           Family History  Problem Relation Age of Onset  . Diabetes Mellitus II Mother   . COPD Father     Social History   Tobacco Use  . Smoking status: Never Smoker  . Smokeless tobacco: Never Used  Vaping Use  . Vaping Use: Never used  Substance Use Topics  . Alcohol use: No  . Drug use: No    Home Medications Prior to Admission medications   Medication Sig Start Date End Date Taking? Authorizing Provider  doxycycline (VIBRAMYCIN) 100 MG capsule Take 1 capsule (100 mg total) by mouth 2 (two) times daily. 10/18/20  Yes Jevaun Strick, Druscilla Brownie, PA-C  allopurinol (ZYLOPRIM) 100 MG tablet Take 100 mg by mouth daily.  [provider]  amLODIPine-Valsartan-HCTZ 10-320-25 MG TABS Take 1 tablet by mouth daily.    [provider]  cetirizine (ZYRTEC) 10 MG tablet Take 10 mg by mouth at bedtime.     [provider]  cyclobenzaprine (FLEXERIL) 5 MG tablet TAKE 1 TABLET BY MOUTH EVERY DAY AT BEDTIME 08/27/20   Sater, Nanine Means, MD  Dimethyl Fumarate (TECFIDERA) 240 MG CPDR TAKE ONE CAPSULE BY MOUTH TWICE DAILY. STORE IN ORIGINAL CONTAINER AT ROOM TEMPERATURE. 11/08/19   Sater, Nanine Means, MD  fluticasone [facility] ALLERGY RELIEF) 50 MCG/ACT nasal spray Place 1 spray into both nostrils daily.    [provider]  ibuprofen (ADVIL,MOTRIN) 600 MG tablet Take 1 tablet (600 mg total) by mouth every 6 (six) hours as needed. 02/23/17   Everitt Amber, MD  Lactobacillus (PROBIOTIC ACIDOPHILUS PO) Take 1 capsule by mouth at  bedtime. 1 BILLION CFUs    [provider]  Multiple Vitamin (MULTI VITAMIN DAILY PO) Take by mouth.    [provider]  pravastatin (PRAVACHOL) 20 MG tablet Take 20 mg by mouth at bedtime.  02/13/16   [provider]  sertraline (ZOLOFT) 50 MG tablet TAKE 1 TABLET (50 MG) BY MOUTH DAILY AT NIGHT. 02/13/16   [provider]    Allergies    Patient has no known allergies.  Review of Systems   Review of Systems  Constitutional: Positive for fever (wednesday- none further). Negative for chills.  Skin: Positive for color change and wound. Rash: abscess.    Physical Exam Updated Vital Signs BP 121/69 (BP Location: Right Arm)   Pulse 90   Temp 99.6 F (37.6 C) (Oral)   Resp 18   Ht 5' 3"  (1.6 m)   Wt 104.3 kg   LMP 02/14/2017   SpO2 100%   BMI 40.74 kg/m   Physical Exam Vitals and nursing note reviewed.  Constitutional:      General: She is not in acute distress.    Appearance: She is not ill-appearing.  HENT:     Head: Normocephalic.  Eyes:     Pupils: Pupils are equal, round, and reactive to light.  Cardiovascular:     Rate and Rhythm: Normal rate and regular rhythm.     Pulses: Normal pulses.     Heart sounds: Normal heart sounds. No murmur heard. No friction rub. No gallop.   Pulmonary:     Effort: Pulmonary effort is normal.     Breath sounds: Normal breath sounds.  Abdominal:     General: Abdomen is flat. There is no distension.     Palpations: Abdomen is soft.     Tenderness: There is no abdominal tenderness. There is no guarding or rebound.  Musculoskeletal:     Cervical back: Neck supple.       Back:     Comments: Able to move all 4 extremities without difficulty.  Skin:    Comments: Large 6x6cm area of induration with central fluctuance in the central thoracic region with surrounding erythema.  Neurological:     General: No focal deficit present.     Mental Status: She is alert.  Psychiatric:        Mood and Affect:  Mood normal.        Behavior: Behavior normal.     ED Results / Procedures / Treatments   Labs (all labs ordered are listed, but only abnormal results are displayed) Labs Reviewed - No data to display  EKG None  Radiology No  results found.  Procedures .Marland KitchenIncision and Drainage  Date/Time: 10/18/2020 10:15 PM Performed by: Suzy Bouchard, PA-C Authorized by: Suzy Bouchard, PA-C   Consent:    Consent obtained:  Verbal   Consent given by:  Patient   Risks discussed:  Bleeding, incomplete drainage, pain and damage to other organs   Alternatives discussed:  No treatment Universal protocol:    Procedure explained and questions answered to patient or proxy's satisfaction: yes     Relevant documents present and verified: yes     Test results available : yes     Imaging studies available: yes     Required blood products, implants, devices, and special equipment available: yes     Site/side marked: yes     Immediately prior to procedure, a time out was called: yes     Patient identity confirmed:  Verbally with patient Location:    Type:  Abscess   Size:  6x6cm   Location:  Trunk   Trunk location:  Back Pre-procedure details:    Skin preparation:  Betadine Anesthesia:    Anesthesia method:  Local infiltration   Local anesthetic:  Lidocaine 1% WITH epi Procedure type:    Complexity:  Complex Procedure details:    Ultrasound guidance: no     Needle aspiration: no     Incision types:  Single straight   Incision depth:  Subcutaneous   Scalpel blade:  11   Wound management:  Probed and deloculated, irrigated with saline and extensive cleaning   Drainage:  Purulent   Drainage amount:  Copious   Wound treatment:  Wound left open   Packing materials:  None Post-procedure details:    Procedure completion:  Tolerated well, no immediate complications   (including critical care time)  Medications Ordered in ED Medications  doxycycline (VIBRA-TABS) tablet 100 mg (has  no administration in time range)  ondansetron (ZOFRAN-ODT) disintegrating tablet 4 mg (has no administration in time range)  lidocaine-EPINEPHrine (XYLOCAINE W/EPI) 2 %-1:200000 (PF) injection 10 mL (10 mLs Infiltration Given by Other 10/18/20 2147)    ED Course  I have reviewed the triage vital signs and the nursing notes.  Pertinent labs & imaging results that were available during my care of the patient were reviewed by me and considered in my medical decision making (see chart for details).    MDM Rules/Calculators/A&P                         50 year old female presents to the ED due to an an abscess on her upper back.  Upon arrival, vitals all within normal limits.  Patient is afebrile, not tachycardic or hypoxic.  Low suspicion for sepsis at this time.  Patient in no acute distress and nontoxic-appearing.  Physical exam significant for 6 x 6 cm area of induration in the central thoracic region with central fluctuance.  Discussed I&D with patient.  Risk and benefits explained.  Patient agrees to move forward.  I&D performed.  See procedure note above.  1st dose of doxycycline given here in the ED. Patient discharged with antibiotics.  Wound recheck by PCP on Monday. Strict ED precautions discussed with patient. Patient states understanding and agrees to plan. Patient discharged home in no acute distress and stable vitals.  Discussed case with Dr. Alvino Chapel who evaluated patient at bedside and agrees with assessment and plan.  Final Clinical Impression(s) / ED Diagnoses Final diagnoses:  Abscess    Rx / DC Orders ED  Discharge Orders         Ordered    doxycycline (VIBRAMYCIN) 100 MG capsule  2 times daily        10/18/20 2213           Karie Kirks 10/18/20 2217    Davonna Belling, MD 10/18/20 2330

## 2020-10-18 NOTE — Discharge Instructions (Signed)
As discussed, please have your wound rechecked on Monday by Dr. Justin Mend.  I am sending home with an antibiotic.  Take as prescribed and finish all antibiotics.  Expect drainage from site.  Continue to use warm compresses in the area to allow for more drainage.  You may take over-the-counter ibuprofen or Tylenol as needed for pain.  Return to the ER for new or worsening symptoms.

## 2020-10-22 NOTE — Telephone Encounter (Signed)
I called [facility] to check the status of pt's mavenclad. Pt has not returned their calls for insurance verification.  I called pt. She reports that she received their calls but has been busy and not called them back. She will call them back shortly. I will check on her in a few weeks. Pt verbalized understanding.

## 2020-10-29 NOTE — Telephone Encounter (Signed)
Submitted PA on CMM. Waiting on determination from [place].

## 2020-10-29 NOTE — Telephone Encounter (Signed)
Initiated PA [place] on [place]. Marked urgent. [id number]. Waiting on questions to become available to proceed with PA.

## 2020-10-30 NOTE — Telephone Encounter (Signed)
PA approved effective 10/29/2020 - 12/13/2020 via [facility]. PA# Will [id number].  Faxed approval letter to [place] at [phone]. Received fax confirmation.

## 2020-11-10 NOTE — Telephone Encounter (Signed)
I called patient.  She has not received her [place] prescription yet.  She has not heard from the specialty pharmacy yet.  I called [facility], spoke with Shanon Brow.  Patient's Mavenclad is ready to be shipped.  [facility] last week and left her voicemail to call them back. Shanon Brow will reach out to [facility] and the patient to get this coordinated.  I will continue to follow.

## 2020-11-10 NOTE — Telephone Encounter (Signed)
Received notification from [place] the patient has scheduled delivery of [place] and should receive it on November 13, 2020.

## 2020-11-17 NOTE — Telephone Encounter (Signed)
I called patient.  She received her shipment of [place] last week and started taking it on Friday.  She is tolerating it well.  She has no questions or concerns at this time.  I reminded her of her appointment on December 01, 2020.  Patient verbalized understanding

## 2020-12-01 ENCOUNTER — Ambulatory Visit: Payer: 59 | Admitting: Neurology

## 2020-12-01 ENCOUNTER — Other Ambulatory Visit: Payer: Self-pay

## 2020-12-01 ENCOUNTER — Encounter: Payer: Self-pay | Admitting: Neurology

## 2020-12-01 VITALS — BP 118/70 | HR 89 | Ht 63.0 in | Wt 235.0 lb

## 2020-12-01 DIAGNOSIS — G35 Multiple sclerosis: Secondary | ICD-10-CM | POA: Diagnosis not present

## 2020-12-01 DIAGNOSIS — R202 Paresthesia of skin: Secondary | ICD-10-CM | POA: Diagnosis not present

## 2020-12-01 DIAGNOSIS — Z79899 Other long term (current) drug therapy: Secondary | ICD-10-CM | POA: Diagnosis not present

## 2020-12-01 DIAGNOSIS — G35D Multiple sclerosis, unspecified: Secondary | ICD-10-CM

## 2020-12-01 DIAGNOSIS — R5383 Other fatigue: Secondary | ICD-10-CM

## 2020-12-01 NOTE — Progress Notes (Signed)
p  [facility]  PATIENT: Beverly Beltran DOB: 01/05/1971  REFERRING DOCTOR OR PCP:  Maurice Small SOURCE: Patient, ED and hospital notes in [place], labs and imaging reports in Epic, personal review of MRI images on PACS.  _________________________________   HISTORICAL  CHIEF COMPLAINT:  Chief Complaint  Patient presents with  . Follow-up    RM 12, alone. Last seen 09/24/2020. On [place] for MS. Started 11/14/20.    HISTORY OF PRESENT ILLNESS:  Beverly Beltran is a 50 y.o. woman with relapsing remitting MS.  Update 12/01/2020 She had her first dose Mavenclad January 29-Feb 2 and will take her next dose in 2 weeks.    She had no side effects.      She feels her MS is stable.  Specifically she has no exacerbations or new symptoms.  She feels gait, balance are unchanged.  She does have mild reduced balance which has been present since the onset.  She uses the bannister on stairs .   She denies any problems with numbness at this time but had some last year.   Bladder function is fine.   Vision is fine.     She has minimal fatigue.    Cognition and mood are fine.    She had cervical cancer stage FICO 1A1 and had a curative hysterectomy in 2018.     MS history: Around 01/28/2016, she had the onset of slurred speech and also felt more fatigue. Additionally, there was a dysesthetic numbness going into the left arm towards the thumb. She notes that she had just moved into a new house and had done quite a bit of activity over the preceding couple of weeks when symptoms did not improve after couple days, she went to the emergency room. There, she was admitted. The initial CT scan was normal but MRI of the brain was consistent with MS. MRI of the spine did not show any MS lesions but did show significant degenerative changes at C5-C6 as well as T7-T8, T8-T9 and T9-T10..   She received 5 days of steroids and was discharged home.  By the third or fourth day of IV steroids,  symptoms were much better.   She was discharged after 5 days of steroids.  She entered the ALKS-8700 evolve study (comparison with Tecfidera for 5 weeks) and the open label extension for the next 2 years.  Due to insurance issues, Vumerity was not available to her and she switched to Tecfidera in 2019.  MRI of the brain in 2020 showed 1 breakthrough lesion.  MRI of the brain in December 2021 showed a second breakthrough lesion.  After discussion of options, she opted to switch to [facility] December 2021  Imaging review:  MRI of the brain 02/03/2016 without contrast and 02/04/2016 with contrast show an enhancing large focus in the left frontal lobe, is also hyperintense on diffusion-weighted images. There are several other T2/FLAIR hyperintense foci in the periventricular, deep and juxtacortical white matter.   The MRI of the cervical spine 02/03/2016 showed normal spinal cord and shows degenerative changes at C5-C6 causing severe right and moderate left foraminal narrowing that could lead to right C6 nerve root compression there are milder degenerative changes at C3-C4 and C4-C5 with less potential for nerve root impingement.    MRI of the thoracic spine 02/03/2016 showed central disc extrusions at T7-T8, T8-T9 and T9-T10 deforming the spinal cord was in mild spinal stenosis of these levels. The spinal cord had normal signal.  MRI of the brain 04/22/2016, 06/09/2016, 05/11/2017 and 04/18/2018 with the drug study protocol showed no new lesions.  MRI of the brain 08/05/2019 shows multiple T2/FLAIR hyperintense foci in the periventricular, juxtacortical and deep white matter in a pattern and configuration consistent with chronic demyelinating plaque associated with multiple sclerosis.  None of these foci enhance or appear to be acute though one focus in the posterior left frontal lobe was not present on the MRI from 04/18/2018 and represents some disease activity.  MRI of the brain 09/20/2020 shows T2/FLAIR  hyperintense foci in the cerebral hemispheres in a pattern and configuration consistent with chronic demyelinating plaque associated with multiple sclerosis. None of the foci were acute or enhance. However, one focus in the right frontal lobe seen on the current MRI was not present on the 08/05/2019 MRI and likely represents interval MS activity.   REVIEW OF SYSTEMS: Constitutional: No fevers, chills, sweats, or change in appetite   she notes fatigue, insomnia and daytime hypersomnia Eyes: No visual changes, double vision, eye pain Ear, nose and throat: No hearing loss, ear pain, nasal congestion, sore throat Cardiovascular: No chest pain, palpitations Respiratory: No shortness of breath at rest or with exertion.   No wheezes.   She snores GastrointestinaI: No nausea, vomiting, diarrhea, abdominal pain, fecal incontinence.  Some constipation Genitourinary: No dysuria, urinary retention.  She has frequency and nocturia. Musculoskeletal: No neck pain, back pain Integumentary: No rash, pruritus, skin lesions Neurological: as above Psychiatric: No depression at this time.  No anxiety now (had in past) Endocrine: No palpitations, diaphoresis, change in appetite, change in weigh or increased thirst Hematologic/Lymphatic: No anemia, purpura, petechiae. Allergic/Immunologic: No itchy/runny eyes, nasal congestion, recent allergic reactions, rashes  ALLERGIES: No Known Allergies  HOME MEDICATIONS:  Current Outpatient Medications:  .  allopurinol (ZYLOPRIM) 100 MG tablet, Take 100 mg by mouth daily., Disp: , Rfl:  .  amLODIPine-Valsartan-HCTZ 10-320-25 MG TABS, Take 1 tablet by mouth daily., Disp: , Rfl:  .  cetirizine (ZYRTEC) 10 MG tablet, Take 10 mg by mouth at bedtime. , Disp: , Rfl:  .  Cladribine (MAVENCLAD, 10 TABS, PO), Take by mouth., Disp: , Rfl:  .  cyclobenzaprine (FLEXERIL) 5 MG tablet, TAKE 1 TABLET BY MOUTH EVERY DAY AT BEDTIME, Disp: 30 tablet, Rfl: 11 .  fluticasone (FLONASE)  50 MCG/ACT nasal spray, Place 1 spray into both nostrils daily., Disp: , Rfl:  .  ibuprofen (ADVIL,MOTRIN) 600 MG tablet, Take 1 tablet (600 mg total) by mouth every 6 (six) hours as needed., Disp: 30 tablet, Rfl: 0 .  Lactobacillus (PROBIOTIC ACIDOPHILUS PO), Take 1 capsule by mouth at bedtime. 1 BILLION CFUs, Disp: , Rfl:  .  meloxicam (MOBIC) 15 MG tablet, Take 15 mg by mouth daily., Disp: , Rfl:  .  Multiple Vitamin (MULTI VITAMIN DAILY PO), Take by mouth., Disp: , Rfl:  .  pravastatin (PRAVACHOL) 20 MG tablet, Take 20 mg by mouth at bedtime. , Disp: , Rfl: 3 .  sertraline (ZOLOFT) 50 MG tablet, TAKE 1 TABLET (50 MG) BY MOUTH DAILY AT NIGHT., Disp: , Rfl: 3  PAST MEDICAL HISTORY: Past Medical History:  Diagnosis Date  . Anxiety   . Cancer ([place]) 01/2017   cervical cancer   . Depression   . Genital warts   . Hypercholesteremia   . Hypertension   . Multiple sclerosis ([place]) 01/2016  . Multiple sclerosis ([place])   . Numbness    occassional numbnes in in extremities  . Vision abnormalities  PAST SURGICAL HISTORY: Past Surgical History:  Procedure Laterality Date  . ABDOMINAL HYSTERECTOMY    . CERVICAL BIOPSY  W/ LOOP ELECTRODE EXCISION    . COLPOSCOPY    . DILATION AND CURETTAGE OF UTERUS  1994  . ROBOTIC ASSISTED TOTAL HYSTERECTOMY WITH BILATERAL SALPINGO OOPHERECTOMY Bilateral 02/22/2017   Procedure: XI ROBOTIC ASSISTED TOTAL HYSTERECTOMY WITH BILATERAL SALPINGECTOMY;  Surgeon: Everitt Amber, MD;  Location: [facility];  Service: Gynecology;  Laterality: Bilateral;    FAMILY HISTORY: Family History  Problem Relation Age of Onset  . Diabetes Mellitus II Mother   . COPD Father     SOCIAL HISTORY:  Social History   Socioeconomic History  . Marital status: Married    Spouse name: Not on file  . Number of children: Not on file  . Years of education: Not on file  . Highest education level: Not on file  Occupational History  . Not on file  Tobacco Use  . Smoking status: Never  Smoker  . Smokeless tobacco: Never Used  Vaping Use  . Vaping Use: Never used  Substance and Sexual Activity  . Alcohol use: No  . Drug use: No  . Sexual activity: Yes    Partners: Male    Birth control/protection: Surgical    Comment: hysterectomy   Other Topics Concern  . Not on file  Social History Narrative  . Not on file   Social Determinants of Health   Financial Resource Strain: Not on file  Food Insecurity: Not on file  Transportation Needs: Not on file  Physical Activity: Not on file  Stress: Not on file  Social Connections: Not on file  Intimate Partner Violence: Not on file     PHYSICAL EXAM  Vitals:   12/01/20 1318  BP: 118/70  Pulse: 89  SpO2: 98%  Weight: 235 lb (106.6 kg)  Height: 5' 3"  (1.6 m)    Body mass index is 41.63 kg/m.  VISUAL Acuity:  20/25 OS  20/30 OD  General: The patient is well-developed and well-nourished and in no acute distress   Neurologic Exam  Mental status: The patient is alert and oriented x 3 at the time of the examination. The patient has apparent normal recent and remote memory, with an apparently normal attention span and concentration ability.   Speech is normal.  Cranial nerves: Extraocular movements are full.   Facial strength and sensation is normal.  Trapezius strength is normal.. No obvious hearing deficits.  Motor:  Muscle bulk is normal.   Tone is normal. Strength is  5 / 5 in all 4 extremities.   Sensory: Sensory testing is intact to touch, temperature and vibration.   Coordination: Cerebellar testing reveals good finger-nose-finger and heel-to-shin bilaterally.  Gait and station: Station is normal.   The gait is normal.  Her tandem gait is mildly wide.  Romberg is negative..   Reflexes: Deep tendon reflexes are symmetric and normal bilaterally.        DIAGNOSTIC DATA (LABS, IMAGING, TESTING) - I reviewed patient records, labs, notes, testing and imaging myself where available.  Lab Results   Component Value Date   WBC 7.6 09/01/2020   HGB 11.8 09/01/2020   HCT 35.5 09/01/2020   MCV 81 09/01/2020   PLT 232 09/01/2020      Component Value Date/Time   NA 134 (L) 02/23/2017 0426   K 4.4 02/23/2017 0426   CL 100 (L) 02/23/2017 0426   CO2 27 02/23/2017 0426   GLUCOSE 154 (H)  02/23/2017 0426   BUN 10 02/23/2017 0426   CREATININE 0.72 02/23/2017 0426   CALCIUM 9.3 02/23/2017 0426   PROT 7.4 09/01/2020 1350   ALBUMIN 4.3 09/01/2020 1350   AST 32 09/01/2020 1350   ALT 43 (H) 09/01/2020 1350   ALKPHOS 69 09/01/2020 1350   BILITOT 0.3 09/01/2020 1350   GFRNONAA >60 02/23/2017 0426   GFRAA >60 02/23/2017 0426   ___________________________________________________   Multiple sclerosis ([place]) - Plan: CBC with Differential/Platelet, Comprehensive metabolic panel  High risk medication use - Plan: CBC with Differential/Platelet, Comprehensive metabolic panel  Other fatigue  Paresthesia  1.  She tolerated the first week of Mavenclad without difficulty and will take her second month in a couple weeks.  I want her to get blood work around the end of March and then follow-up to see me in June or July.  2.   Stay active and exercise as tolerated.   3.   Return in 4 months or sooner if there are new or worsening neurologic symptoms.  Ceara Wrightson A. Felecia Shelling, MD, PhD 0/15/8682, 5:74 PM Certified in Neurology, [place] Neurophysiology, Sleep Medicine, Pain Medicine and Neuroimaging  [facility] [address] [place], [place] [address] [phone]

## 2020-12-09 ENCOUNTER — Telehealth: Payer: Self-pay | Admitting: *Deleted

## 2020-12-09 NOTE — Telephone Encounter (Signed)
Received fax from [place] that pt scheduled delivery of [place] and should receive it on 12/10/20. [facility] ID# [id number].

## 2020-12-16 ENCOUNTER — Telehealth: Payer: Self-pay | Admitting: *Deleted

## 2020-12-16 NOTE — Telephone Encounter (Signed)
Initiated PA [place] on [place]. Key: [id number]. In process of completing.

## 2020-12-16 NOTE — Telephone Encounter (Signed)
Submitted PA. Waiting on determination from [facility].

## 2020-12-17 NOTE — Telephone Encounter (Signed)
Received fax from [place] that [place] approved 12/16/20-01/30/21. [place] [id number]

## 2021-01-12 NOTE — Progress Notes (Signed)
50 y.o. [id number] Married White or Caucasian Not Hispanic or Latino female here for annual exam.  The patient had a leep in 12/2016 that was + for CIN III, small focus suspicious for invasive cancer. She then had a hysterectomy/BS with Dr Denman George. Final pathology was stage 1A1 cervical cancer.  No vasomotor symptoms, no vaginal dryness.   She has had some intermittent itching in bilateral groin for the last 2 weeks.   No dyspareunia.  Has MS, has had some medication changes. No exacerbations, has 2 lesions on MRI. Has heat sensitivity. No bowel or bladder c/o.      Patient's last menstrual period was 02/14/2017.          Sexually active: Yes.    The current method of family planning is status post hysterectomy.    Exercising: No.  The patient does not participate in regular exercise at present. Smoker:  no  Health Maintenance: Pap:  01/04/2019 Neg HR HPV Neg,             12/14/2017 WNL NEG HPV             12-08-16 ASCUS + HR HPV colposcopy CIN III LEEP                       suspiciousfor invasive cervical cancer- sent to                   GYN oncology- had hysterectomy History of abnormal Pap:  yes MMG:  12/15/17 Density B Bi-rads 1 Neg  BMD:   None  Colonoscopy: none  TDaP:  Unsure  [place]: NA   reports that she has never smoked. She has never used smokeless tobacco. She reports that she does not drink alcohol and does not use drugs. Homemaker, kids are 21and 19(son and daughter)  Past Medical History:  Diagnosis Date  . Anxiety   . Cancer ([place]) 01/2017   cervical cancer   . Depression   . Genital warts   . Hypercholesteremia   . Hypertension   . Multiple sclerosis ([place]) 01/2016  . Multiple sclerosis ([place])   . Numbness    occassional numbnes in in extremities  . Vision abnormalities     Past Surgical History:  Procedure Laterality Date  . ABDOMINAL HYSTERECTOMY    . CERVICAL BIOPSY  W/ LOOP ELECTRODE EXCISION    . COLPOSCOPY    . DILATION AND CURETTAGE OF UTERUS   1994  . ROBOTIC ASSISTED TOTAL HYSTERECTOMY WITH BILATERAL SALPINGO OOPHERECTOMY Bilateral 02/22/2017   Procedure: XI ROBOTIC ASSISTED TOTAL HYSTERECTOMY WITH BILATERAL SALPINGECTOMY;  Surgeon: Everitt Amber, MD;  Location: [facility];  Service: Gynecology;  Laterality: Bilateral;    Current Outpatient Medications  Medication Sig Dispense Refill  . allopurinol (ZYLOPRIM) 100 MG tablet Take 100 mg by mouth daily.    Marland Kitchen amLODIPine-Valsartan-HCTZ 10-320-25 MG TABS Take 1 tablet by mouth daily.    . cetirizine (ZYRTEC) 10 MG tablet Take 10 mg by mouth at bedtime.     . Cladribine (MAVENCLAD, 10 TABS, PO) Take by mouth.    . cyclobenzaprine (FLEXERIL) 5 MG tablet TAKE 1 TABLET BY MOUTH EVERY DAY AT BEDTIME 30 tablet 11  . fluticasone (FLONASE) 50 MCG/ACT nasal spray Place 1 spray into both nostrils daily.    Marland Kitchen ibuprofen (ADVIL,MOTRIN) 600 MG tablet Take 1 tablet (600 mg total) by mouth every 6 (six) hours as needed. 30 tablet 0  . Lactobacillus (PROBIOTIC ACIDOPHILUS PO) Take 1 capsule  by mouth at bedtime. 1 BILLION CFUs    . meloxicam (MOBIC) 15 MG tablet Take 15 mg by mouth daily.    . Multiple Vitamin (MULTI VITAMIN DAILY PO) Take by mouth.    . pravastatin (PRAVACHOL) 20 MG tablet Take 20 mg by mouth at bedtime.   3  . sertraline (ZOLOFT) 50 MG tablet TAKE 1 TABLET (50 MG) BY MOUTH DAILY AT NIGHT.  3   No current facility-administered medications for this visit.    Family History  Problem Relation Age of Onset  . Diabetes Mellitus II Mother   . COPD Father     Review of Systems  All other systems reviewed and are negative.   Exam:   LMP 02/14/2017   Weight change: @WEIGHTCHANGE @ Height:      Ht Readings from Last 3 Encounters:  12/01/20 5' 3"  (1.6 m)  10/18/20 5' 3"  (1.6 m)  09/24/20 5' 3"  (1.6 m)    General appearance: alert, cooperative and appears stated age Head: Normocephalic, without obvious abnormality, atraumatic Neck: no adenopathy, supple, symmetrical, trachea midline and  thyroid normal to inspection and palpation Lungs: clear to auscultation bilaterally Cardiovascular: regular rate and rhythm Breasts: normal appearance, no masses or tenderness Abdomen: soft, non-tender; non distended,  no masses,  no organomegaly Extremities: extremities normal, atraumatic, no cyanosis or edema Skin: Skin color, texture, turgor normal. No rashes or lesions Lymph nodes: Cervical, supraclavicular, and axillary nodes normal. No abnormal inguinal nodes palpated Neurologic: Grossly normal   Pelvic: External genitalia:  no lesions              Urethra:  normal appearing urethra with no masses, tenderness or lesions              Bartholins and Skenes: normal                 Vagina: well estrogenized appearing vagina with normal color and discharge, no lesions              Cervix: absent               Bimanual Exam:  Uterus:  uterus absent              Adnexa: no mass, fullness, tenderness               Rectovaginal: Confirms               Anus:  normal sphincter tone, no lesions  Gae Dry chaperoned for the exam.  1. Well woman exam Discussed breast self exam Discussed calcium and vit D intake Mammogram overdue, # given Labs with primary  2. History of cervical cancer - Cytology - PAP  3. Screening for vaginal cancer - Cytology - PAP  4. Colon cancer screening - Ambulatory referral to Gastroenterology

## 2021-01-14 ENCOUNTER — Other Ambulatory Visit (HOSPITAL_COMMUNITY)
Admission: RE | Admit: 2021-01-14 | Discharge: 2021-01-14 | Disposition: A | Discharge status: Home / Self Care | Payer: 59 | Source: Ambulatory Visit | Attending: Obstetrics and Gynecology | Admitting: Obstetrics and Gynecology

## 2021-01-14 ENCOUNTER — Ambulatory Visit (INDEPENDENT_AMBULATORY_CARE_PROVIDER_SITE_OTHER): Payer: 59 | Admitting: Obstetrics and Gynecology

## 2021-01-14 ENCOUNTER — Encounter: Payer: Self-pay | Admitting: Obstetrics and Gynecology

## 2021-01-14 ENCOUNTER — Other Ambulatory Visit: Payer: Self-pay

## 2021-01-14 VITALS — BP 140/74 | HR 93 | Ht 63.0 in | Wt 240.0 lb

## 2021-01-14 DIAGNOSIS — Z01419 Encounter for gynecological examination (general) (routine) without abnormal findings: Secondary | ICD-10-CM | POA: Diagnosis not present

## 2021-01-14 DIAGNOSIS — Z8541 Personal history of malignant neoplasm of cervix uteri: Secondary | ICD-10-CM | POA: Insufficient documentation

## 2021-01-14 DIAGNOSIS — Z1211 Encounter for screening for malignant neoplasm of colon: Secondary | ICD-10-CM | POA: Diagnosis not present

## 2021-01-14 DIAGNOSIS — M5030 Other cervical disc degeneration, unspecified cervical region: Secondary | ICD-10-CM | POA: Insufficient documentation

## 2021-01-14 DIAGNOSIS — M1000 Idiopathic gout, unspecified site: Secondary | ICD-10-CM | POA: Insufficient documentation

## 2021-01-14 DIAGNOSIS — Z1272 Encounter for screening for malignant neoplasm of vagina: Secondary | ICD-10-CM | POA: Insufficient documentation

## 2021-01-14 DIAGNOSIS — C530 Malignant neoplasm of endocervix: Secondary | ICD-10-CM | POA: Insufficient documentation

## 2021-01-14 DIAGNOSIS — M1A00X0 Idiopathic chronic gout, unspecified site, without tophus (tophi): Secondary | ICD-10-CM | POA: Insufficient documentation

## 2021-01-14 DIAGNOSIS — R7303 Prediabetes: Secondary | ICD-10-CM | POA: Insufficient documentation

## 2021-01-14 NOTE — Patient Instructions (Signed)
EXERCISE   We recommended that you start or continue a regular exercise program for good health. Physical activity is anything that gets your body moving, some is better than none. The CDC recommends 150 minutes per week of Moderate-Intensity Aerobic Activity and 2 or more days of Muscle Strengthening Activity.  Benefits of exercise are limitless: helps weight loss/weight maintenance, improves mood and energy, helps with depression and anxiety, improves sleep, tones and strengthens muscles, improves balance, improves bone density, protects from chronic conditions such as heart disease, high blood pressure and diabetes and so much more. To learn more visit: https://www.cdc.gov/physicalactivity/index.html  DIET: Good nutrition starts with a healthy diet of fruits, vegetables, whole grains, and lean protein sources. Drink plenty of water for hydration. Minimize empty calories, sodium, sweets. For more information about dietary recommendations visit: https://health.gov/our-work/nutrition-physical-activity/dietary-guidelines and https://www.myplate.gov/  ALCOHOL:  Women should limit their alcohol intake to no more than 7 drinks/beers/glasses of wine (combined, not each!) per week. Moderation of alcohol intake to this level decreases your risk of breast cancer and liver damage.  If you are concerned that you may have a problem, or your friends have told you they are concerned about your drinking, there are many resources to help. A well-known program that is free, effective, and available to all people all over the nation is Alcoholics Anonymous.  Check out this site to learn more: https://www.aa.org/   CALCIUM AND VITAMIN D:  Adequate intake of calcium and Vitamin D are recommended for bone health.  You should be getting between 1000-1200 mg of calcium and 800 units of Vitamin D daily between diet and supplements  PAP SMEARS:  Pap smears, to check for cervical cancer or precancers,  have traditionally been  done yearly, scientific advances have shown that most women can have pap smears less often.  However, every woman still should have a physical exam from her gynecologist every year. It will include a breast check, inspection of the vulva and vagina to check for abnormal growths or skin changes, a visual exam of the cervix, and then an exam to evaluate the size and shape of the uterus and ovaries. We will also provide age appropriate advice regarding health maintenance, like when you should have certain vaccines, screening for sexually transmitted diseases, bone density testing, colonoscopy, mammograms, etc.   MAMMOGRAMS:  All women over 40 years old should have a routine mammogram.   COLON CANCER SCREENING: Now recommend starting at age 45. At this time colonoscopy is not covered for routine screening until 50. There are take home tests that can be done between 45-49.   COLONOSCOPY:  Colonoscopy to screen for colon cancer is recommended for all women at age 50.  We know, you hate the idea of the prep.  We agree, BUT, having colon cancer and not knowing it is worse!!  Colon cancer so often starts as a polyp that can be seen and removed at colonscopy, which can quite literally save your life!  And if your first colonoscopy is normal and you have no family history of colon cancer, most women don't have to have it again for 10 years.  Once every ten years, you can do something that may end up saving your life, right?  We will be happy to help you get it scheduled when you are ready.  Be sure to check your insurance coverage so you understand how much it will cost.  It may be covered as a preventative service at no cost, but you should check   your particular policy.      Breast Self-Awareness Breast self-awareness means being familiar with how your breasts look and feel. It involves checking your breasts regularly and reporting any changes to your health care provider. Practicing breast self-awareness is  important. A change in your breasts can be a sign of a serious medical problem. Being familiar with how your breasts look and feel allows you to find any problems early, when treatment is more likely to be successful. All women should practice breast self-awareness, including women who have had breast implants. How to do a breast self-exam One way to learn what is normal for your breasts and whether your breasts are changing is to do a breast self-exam. To do a breast self-exam: Look for Changes  1. Remove all the clothing above your waist. 2. Stand in front of a mirror in a room with good lighting. 3. Put your hands on your hips. 4. Push your hands firmly downward. 5. Compare your breasts in the mirror. Look for differences between them (asymmetry), such as: ? Differences in shape. ? Differences in size. ? Puckers, dips, and bumps in one breast and not the other. 6. Look at each breast for changes in your skin, such as: ? Redness. ? Scaly areas. 7. Look for changes in your nipples, such as: ? Discharge. ? Bleeding. ? Dimpling. ? Redness. ? A change in position. Feel for Changes Carefully feel your breasts for lumps and changes. It is best to do this while lying on your back on the floor and again while sitting or standing in the shower or tub with soapy water on your skin. Feel each breast in the following way:  Place the arm on the side of the breast you are examining above your head.  Feel your breast with the other hand.  Start in the nipple area and make  inch (2 cm) overlapping circles to feel your breast. Use the pads of your three middle fingers to do this. Apply light pressure, then medium pressure, then firm pressure. The light pressure will allow you to feel the tissue closest to the skin. The medium pressure will allow you to feel the tissue that is a little deeper. The firm pressure will allow you to feel the tissue close to the ribs.  Continue the overlapping circles,  moving downward over the breast until you feel your ribs below your breast.  Move one finger-width toward the center of the body. Continue to use the  inch (2 cm) overlapping circles to feel your breast as you move slowly up toward your collarbone.  Continue the up and down exam using all three pressures until you reach your armpit.  Write Down What You Find  Write down what is normal for each breast and any changes that you find. Keep a written record with breast changes or normal findings for each breast. By writing this information down, you do not need to depend only on memory for size, tenderness, or location. Write down where you are in your menstrual cycle, if you are still menstruating. If you are having trouble noticing differences in your breasts, do not get discouraged. With time you will become more familiar with the variations in your breasts and more comfortable with the exam. How often should I examine my breasts? Examine your breasts every month. If you are breastfeeding, the best time to examine your breasts is after a feeding or after using a breast pump. If you menstruate, the best time to   examine your breasts is 5-7 days after your period is over. During your period, your breasts are lumpier, and it may be more difficult to notice changes. When should I see my health care provider? See your health care provider if you notice:  A change in shape or size of your breasts or nipples.  A change in the skin of your breast or nipples, such as a reddened or scaly area.  Unusual discharge from your nipples.  A lump or thick area that was not there before.  Pain in your breasts.  Anything that concerns you.  Perimenopause Perimenopause is the normal time of a woman's life when the levels of estrogen, the female hormone produced by the ovaries, begin to decrease. This leads to changes in menstrual periods before they stop completely (menopause). Perimenopause can begin 2-8 years  before menopause. During perimenopause, the ovaries may or may not produce an egg and a woman can still become pregnant. What are the causes? This condition is caused by a natural change in hormone levels that happens as you get older. What increases the risk? This condition is more likely to start at an earlier age if you have certain medical conditions or have undergone treatments, including:  A tumor of the pituitary gland in the brain.  A disease that affects the ovaries and hormone production.  Certain cancer treatments, such as chemotherapy or hormone therapy, or radiation therapy on the pelvis.  Heavy smoking and excessive alcohol use.  Family history of early menopause. What are the signs or symptoms? Perimenopausal changes affect each woman differently. Symptoms of this condition may include:  Hot flashes.  Irregular menstrual periods.  Night sweats.  Changes in feelings about sex. This could be a decrease in sex drive or an increased discomfort around your sexuality.  Vaginal dryness.  Headaches.  Mood swings.  Depression.  Problems sleeping (insomnia).  Memory problems or trouble concentrating.  Irritability.  Tiredness.  Weight gain.  Anxiety.  Trouble getting pregnant. How is this diagnosed? This condition is diagnosed based on your medical history, a physical exam, your age, your menstrual history, and your symptoms. Hormone tests may also be done. How is this treated? In some cases, no treatment is needed. You and your health care provider should make a decision together about whether treatment is necessary. Treatment will be based on your individual condition and preferences. Various treatments are available, such as:  Menopausal hormone therapy (MHT).  Medicines to treat specific symptoms.  Acupuncture.  Vitamin or herbal supplements. Before starting treatment, make sure to let your health care provider know if you have a personal or family  history of:  Heart disease.  Breast cancer.  Blood clots.  Diabetes.  Osteoporosis. Follow these instructions at home: Medicines  Take over-the-counter and prescription medicines only as told by your health care provider.  Take vitamin supplements only as told by your health care provider.  Talk with your health care provider before starting any herbal supplements. Lifestyle  Do not use any products that contain nicotine or tobacco, such as cigarettes, e-cigarettes, and chewing tobacco. If you need help quitting, ask your health care provider.  Get at least 30 minutes of physical activity on 5 or more days each week.  Eat a balanced diet that includes fresh fruits and vegetables, whole grains, soybeans, eggs, lean meat, and low-fat dairy.  Avoid alcoholic and caffeinated beverages, as well as spicy foods. This may help prevent hot flashes.  Get 7-8 hours of sleep  each night.  Dress in layers that can be removed to help you manage hot flashes.  Find ways to manage stress, such as deep breathing, meditation, or journaling.   General instructions  Keep track of your menstrual periods, including: ? When they occur. ? How heavy they are and how long they last. ? How much time passes between periods.  Keep track of your symptoms, noting when they start, how often you have them, and how long they last.  Use vaginal lubricants or moisturizers to help with vaginal dryness and improve comfort during sex.  You can still become pregnant if you are having irregular periods. Make sure you use contraception during perimenopause if you do not want to get pregnant.  Keep all follow-up visits. This is important. This includes any group therapy or counseling.   Contact a health care provider if:  You have heavy vaginal bleeding or pass blood clots.  Your period lasts more than 2 days longer than normal.  Your periods are recurring sooner than 21 days.  You bleed after having  sex.  You have pain during sex. Get help right away if you have:  Chest pain, trouble breathing, or trouble talking.  Severe depression.  Pain when you urinate.  Severe headaches.  Vision problems. Summary  Perimenopause is the time when a woman's body begins to move into menopause. This may happen naturally or as a result of other health problems or medical treatments.  Perimenopause can begin 2-8 years before menopause, and it can last for several years.  Perimenopausal symptoms can be managed through medicines, lifestyle changes, and complementary therapies such as acupuncture. This information is not intended to replace advice given to you by your health care provider. Make sure you discuss any questions you have with your health care provider. Document Revised: 03/20/2020 Document Reviewed: 03/20/2020 Elsevier Patient Education  [place].

## 2021-01-15 ENCOUNTER — Ambulatory Visit: Payer: 59 | Admitting: Neurology

## 2021-01-22 LAB — CYTOLOGY - PAP
Comment: NEGATIVE
Diagnosis: NEGATIVE
Diagnosis: REACTIVE
High risk HPV: NEGATIVE

## 2021-01-28 ENCOUNTER — Other Ambulatory Visit (INDEPENDENT_AMBULATORY_CARE_PROVIDER_SITE_OTHER): Payer: Self-pay

## 2021-01-28 DIAGNOSIS — Z0289 Encounter for other administrative examinations: Secondary | ICD-10-CM

## 2021-01-28 DIAGNOSIS — Z79899 Other long term (current) drug therapy: Secondary | ICD-10-CM

## 2021-01-28 DIAGNOSIS — G35 Multiple sclerosis: Secondary | ICD-10-CM

## 2021-01-29 LAB — CBC WITH DIFFERENTIAL/PLATELET
Basophils Absolute: 0 10*3/uL (ref 0.0–0.2)
Basos: 1 %
EOS (ABSOLUTE): 0.5 10*3/uL — ABNORMAL HIGH (ref 0.0–0.4)
Eos: 8 %
Hematocrit: 39.5 % (ref 34.0–46.6)
Hemoglobin: 12.8 g/dL (ref 11.1–15.9)
Immature Grans (Abs): 0 10*3/uL (ref 0.0–0.1)
Immature Granulocytes: 1 %
Lymphocytes Absolute: 0.8 10*3/uL (ref 0.7–3.1)
Lymphs: 13 %
MCH: 26.8 pg (ref 26.6–33.0)
MCHC: 32.4 g/dL (ref 31.5–35.7)
MCV: 83 fL (ref 79–97)
Monocytes Absolute: 0.4 10*3/uL (ref 0.1–0.9)
Monocytes: 7 %
Neutrophils Absolute: 4.5 10*3/uL (ref 1.4–7.0)
Neutrophils: 70 %
Platelets: 276 10*3/uL (ref 150–450)
RBC: 4.77 x10E6/uL (ref 3.77–5.28)
RDW: 15.3 % (ref 11.7–15.4)
WBC: 6.2 10*3/uL (ref 3.4–10.8)

## 2021-01-29 LAB — COMPREHENSIVE METABOLIC PANEL
ALT: 60 IU/L — ABNORMAL HIGH (ref 0–32)
AST: 40 IU/L (ref 0–40)
Albumin/Globulin Ratio: 1.3 (ref 1.2–2.2)
Albumin: 4.2 g/dL (ref 3.8–4.8)
Alkaline Phosphatase: 95 IU/L (ref 44–121)
BUN/Creatinine Ratio: 13 (ref 9–23)
BUN: 10 mg/dL (ref 6–24)
Bilirubin Total: 0.4 mg/dL (ref 0.0–1.2)
CO2: 18 mmol/L — ABNORMAL LOW (ref 20–29)
Calcium: 9.9 mg/dL (ref 8.7–10.2)
Chloride: 102 mmol/L (ref 96–106)
Creatinine, Ser: 0.78 mg/dL (ref 0.57–1.00)
Globulin, Total: 3.3 g/dL (ref 1.5–4.5)
Glucose: 122 mg/dL — ABNORMAL HIGH (ref 65–99)
Potassium: 4 mmol/L (ref 3.5–5.2)
Sodium: 140 mmol/L (ref 134–144)
Total Protein: 7.5 g/dL (ref 6.0–8.5)
eGFR: 93 mL/min/{1.73_m2} (ref >59)

## 2021-03-04 ENCOUNTER — Ambulatory Visit: Payer: 59 | Admitting: Neurology

## 2021-03-11 ENCOUNTER — Ambulatory Visit: Payer: 59 | Admitting: Neurology

## 2021-04-08 ENCOUNTER — Encounter: Payer: Self-pay | Admitting: Neurology

## 2021-04-08 ENCOUNTER — Ambulatory Visit: Payer: 59 | Admitting: Neurology

## 2021-04-08 VITALS — BP 150/91 | HR 83 | Ht 63.0 in | Wt 243.0 lb

## 2021-04-08 DIAGNOSIS — R5383 Other fatigue: Secondary | ICD-10-CM | POA: Diagnosis not present

## 2021-04-08 DIAGNOSIS — G35 Multiple sclerosis: Secondary | ICD-10-CM | POA: Diagnosis not present

## 2021-04-08 DIAGNOSIS — Z79899 Other long term (current) drug therapy: Secondary | ICD-10-CM | POA: Diagnosis not present

## 2021-04-08 DIAGNOSIS — R202 Paresthesia of skin: Secondary | ICD-10-CM | POA: Diagnosis not present

## 2021-04-08 NOTE — Progress Notes (Signed)
p  [facility]  PATIENT: Beverly Beltran DOB: 11/15/1970  REFERRING DOCTOR OR PCP:  Maurice Small SOURCE: Patient, ED and hospital notes in [place], labs and imaging reports in Epic, personal review of MRI images on PACS.  _________________________________   HISTORICAL  CHIEF COMPLAINT:  Chief Complaint  Patient presents with   Follow-up    RM 12, alone. Last seen 12/01/20. On [place] for MS.    HISTORY OF PRESENT ILLNESS:  Beverly Beltran is a 50 y.o. woman with relapsing remitting MS.  Update 04/08/2021 She had her first dose Mavenclad January 29-Feb 2 and second dose 4 weeks later.    She had no side effects while taking the medication or afterwards..    She had CBC / DIff in 01/28/2021 (lymphocytes low normal at 0.8).   AST = 40 and ALT = 60 (slightly elevated).    She feels her MS is stable.  Specifically she has no exacerbations or new symptoms.  She feels gait, balance are unchanged.  She does have mild reduced balance which has been present since the onset.  She uses the bannister on stairs .   She could easily walk a mile but feels she is not at her pre-MS baseline.    She denies any problems with numbness at this time but had some last year.   Bladder function is fine.   Vision is fine.     She has minimal fatigue.    Cognition and mood are fine.    She had cervical cancer stage FICO 1A1 and had a curative hysterectomy in 2018 thus she no longer has a cervix.       MS history: Around 01/28/2016, she had the onset of slurred speech and also felt more fatigue. Additionally, there was a dysesthetic numbness going into the left arm towards the thumb. She notes that she had just moved into a new house and had done quite a bit of activity over the preceding couple of weeks when symptoms did not improve after couple days, she went to the emergency room. There, she was admitted. The initial CT scan was normal but MRI of the brain was consistent with MS. MRI of  the spine did not show any MS lesions but did show significant degenerative changes at C5-C6 as well as T7-T8, T8-T9 and T9-T10..   She received 5 days of steroids and was discharged home.  By the third or fourth day of IV steroids, symptoms were much better.   She was discharged after 5 days of steroids.  She entered the ALKS-8700 evolve study (comparison with Tecfidera for 5 weeks) and the open label extension for the next 2 years.  Due to insurance issues, Vumerity was not available to her and she switched to Tecfidera in 2019.  MRI of the brain in 2020 showed 1 breakthrough lesion.  MRI of the brain in December 2021 showed a second breakthrough lesion.  After discussion of options, she opted to switch to [facility] December 2021  Imaging review:  MRI of the brain 02/03/2016 without contrast and 02/04/2016 with contrast show an enhancing large focus in the left frontal lobe, is also hyperintense on diffusion-weighted images. There are several other T2/FLAIR hyperintense foci in the periventricular, deep and juxtacortical white matter.   The MRI of the cervical spine 02/03/2016 showed normal spinal cord and shows degenerative changes at C5-C6 causing severe right and moderate left foraminal narrowing that could lead to right C6 nerve root compression there  are milder degenerative changes at C3-C4 and C4-C5 with less potential for nerve root impingement.    MRI of the thoracic spine 02/03/2016 showed central disc extrusions at T7-T8, T8-T9 and T9-T10 deforming the spinal cord was in mild spinal stenosis of these levels. The spinal cord had normal signal.  MRI of the brain 04/22/2016, 06/09/2016, 05/11/2017 and 04/18/2018 with the drug study protocol showed no new lesions.  MRI of the brain 08/05/2019 shows multiple T2/FLAIR hyperintense foci in the periventricular, juxtacortical and deep white matter in a pattern and configuration consistent with chronic demyelinating plaque associated with multiple sclerosis.   None of these foci enhance or appear to be acute though one focus in the posterior left frontal lobe was not present on the MRI from 04/18/2018 and represents some disease activity.  MRI of the brain 09/20/2020 shows T2/FLAIR hyperintense foci in the cerebral hemispheres in a pattern and configuration consistent with chronic demyelinating plaque associated with multiple sclerosis. None of the foci were acute or enhance. However, one focus in the right frontal lobe seen on the current MRI was not present on the 08/05/2019 MRI and likely represents interval MS activity.   REVIEW OF SYSTEMS: Constitutional: No fevers, chills, sweats, or change in appetite   she notes fatigue, insomnia and daytime hypersomnia Eyes: No visual changes, double vision, eye pain Ear, nose and throat: No hearing loss, ear pain, nasal congestion, sore throat Cardiovascular: No chest pain, palpitations Respiratory:  No shortness of breath at rest or with exertion.   No wheezes.   She snores GastrointestinaI: No nausea, vomiting, diarrhea, abdominal pain, fecal incontinence.  Some constipation Genitourinary:  No dysuria, urinary retention.  She has frequency and nocturia. Musculoskeletal:  No neck pain, back pain Integumentary: No rash, pruritus, skin lesions Neurological: as above Psychiatric: No depression at this time.  No anxiety now (had in past) Endocrine: No palpitations, diaphoresis, change in appetite, change in weigh or increased thirst Hematologic/Lymphatic:  No anemia, purpura, petechiae. Allergic/Immunologic: No itchy/runny eyes, nasal congestion, recent allergic reactions, rashes  ALLERGIES: No Known Allergies  HOME MEDICATIONS:  Current Outpatient Medications:    allopurinol (ZYLOPRIM) 100 MG tablet, Take 100 mg by mouth daily., Disp: , Rfl:    amLODIPine-Valsartan-HCTZ 10-320-25 MG TABS, Take 1 tablet by mouth daily., Disp: , Rfl:    cetirizine (ZYRTEC) 10 MG tablet, Take 10 mg by mouth at bedtime. ,  Disp: , Rfl:    Cladribine (MAVENCLAD, 10 TABS, PO), Take by mouth., Disp: , Rfl:    cyclobenzaprine (FLEXERIL) 5 MG tablet, TAKE 1 TABLET BY MOUTH EVERY DAY AT BEDTIME, Disp: 30 tablet, Rfl: 11   fluticasone (FLONASE) 50 MCG/ACT nasal spray, Place 1 spray into both nostrils daily., Disp: , Rfl:    ibuprofen (ADVIL,MOTRIN) 600 MG tablet, Take 1 tablet (600 mg total) by mouth every 6 (six) hours as needed., Disp: 30 tablet, Rfl: 0   Lactobacillus (PROBIOTIC ACIDOPHILUS PO), Take 1 capsule by mouth at bedtime. 1 BILLION CFUs, Disp: , Rfl:    meloxicam (MOBIC) 15 MG tablet, Take 15 mg by mouth daily., Disp: , Rfl:    Multiple Vitamin (MULTI VITAMIN DAILY PO), Take by mouth., Disp: , Rfl:    pravastatin (PRAVACHOL) 20 MG tablet, Take 20 mg by mouth at bedtime. , Disp: , Rfl: 3   sertraline (ZOLOFT) 50 MG tablet, TAKE 1 TABLET (50 MG) BY MOUTH DAILY AT NIGHT., Disp: , Rfl: 3  PAST MEDICAL HISTORY: Past Medical History:  Diagnosis Date   Anxiety  Cancer ([place]) 01/2017   cervical cancer    Depression    Genital warts    Hypercholesteremia    Hypertension    Multiple sclerosis ([place]) 01/2016   Multiple sclerosis ([place])    Numbness    occassional numbnes in in extremities   Vision abnormalities     PAST SURGICAL HISTORY: Past Surgical History:  Procedure Laterality Date   ABDOMINAL HYSTERECTOMY     CERVICAL BIOPSY  W/ LOOP ELECTRODE EXCISION     COLPOSCOPY     DILATION AND CURETTAGE OF UTERUS  1994   ROBOTIC ASSISTED TOTAL HYSTERECTOMY WITH BILATERAL SALPINGO OOPHERECTOMY Bilateral 02/22/2017   Procedure: XI ROBOTIC ASSISTED TOTAL HYSTERECTOMY WITH BILATERAL SALPINGECTOMY;  Surgeon: Everitt Amber, MD;  Location: [facility];  Service: Gynecology;  Laterality: Bilateral;    FAMILY HISTORY: Family History  Problem Relation Age of Onset   Diabetes Mellitus II Mother    COPD Father     SOCIAL HISTORY:  Social History   Socioeconomic History   Marital status: Married    Spouse name: Not  on file   Number of children: Not on file   Years of education: Not on file   Highest education level: Not on file  Occupational History   Not on file  Tobacco Use   Smoking status: Never   Smokeless tobacco: Never  Vaping Use   Vaping Use: Never used  Substance and Sexual Activity   Alcohol use: No   Drug use: No   Sexual activity: Yes    Partners: Male    Birth control/protection: Surgical    Comment: hysterectomy   Other Topics Concern   Not on file  Social History Narrative   Not on file   Social Determinants of Health   Financial Resource Strain: Not on file  Food Insecurity: Not on file  Transportation Needs: Not on file  Physical Activity: Not on file  Stress: Not on file  Social Connections: Not on file  Intimate Partner Violence: Not on file     PHYSICAL EXAM  Vitals:   04/08/21 1255  BP: (!) 150/91  Pulse: 83  Weight: 243 lb (110.2 kg)  Height: 5' 3"  (1.6 m)    Body mass index is 43.05 kg/m.  VISUAL Acuity:  20/25 OS  20/30 OD  General: The patient is well-developed and well-nourished and in no acute distress   Neurologic Exam  Mental status: The patient is alert and oriented x 3 at the time of the examination. The patient has apparent normal recent and remote memory, with an apparently normal attention span and concentration ability.   Speech is normal.  Cranial nerves: Extraocular movements are full.   She has normal facial strength and sensation.  Trapezius strength is normal.. No obvious hearing deficits.  Motor:  Muscle bulk is normal.   Tone is normal. Strength is  5 / 5 in all 4 extremities.   Sensory: Sensory testing is intact to touch, temperature and vibration.   Coordination: Cerebellar testing reveals good finger-nose-finger and heel-to-shin bilaterally.  Gait and station: Station is normal.   The gait is normal.  The tandem gait is mildly wide..  Romberg is negative..   Reflexes: Deep tendon reflexes are symmetric and normal  bilaterally.        DIAGNOSTIC DATA (LABS, IMAGING, TESTING) - I reviewed patient records, labs, notes, testing and imaging myself where available.  Lab Results  Component Value Date   WBC 6.2 01/28/2021   HGB 12.8 01/28/2021  HCT 39.5 01/28/2021   MCV 83 01/28/2021   PLT 276 01/28/2021      Component Value Date/Time   NA 140 01/28/2021 1108   K 4.0 01/28/2021 1108   CL 102 01/28/2021 1108   CO2 18 (L) 01/28/2021 1108   GLUCOSE 122 (H) 01/28/2021 1108   GLUCOSE 154 (H) 02/23/2017 0426   BUN 10 01/28/2021 1108   CREATININE 0.78 01/28/2021 1108   CALCIUM 9.9 01/28/2021 1108   PROT 7.5 01/28/2021 1108   ALBUMIN 4.2 01/28/2021 1108   AST 40 01/28/2021 1108   ALT 60 (H) 01/28/2021 1108   ALKPHOS 95 01/28/2021 1108   BILITOT 0.4 01/28/2021 1108   GFRNONAA >60 02/23/2017 0426   GFRAA >60 02/23/2017 0426   ___________________________________________________   Multiple sclerosis ([place]) - Plan: CBC with Differential/Platelet, Comprehensive metabolic panel  High risk medication use - Plan: CBC with Differential/Platelet, Comprehensive metabolic panel  Paresthesia  Other fatigue  1.  She tolerated the first courses of [facility] without difficulty and will take her second year in January 2023.Marland Kitchen  She will come in to get some blood work done in August and I will see her back for a visit in December.  At that time, we will need to check standard blood work as well as recheck for chronic infections  2.   Stay active and exercise as tolerated.   3.   Return in 4 months or sooner if there are new or worsening neurologic symptoms.   Mri labs in December  Kenyata Napier A. Felecia Shelling, MD, PhD 3/40/3709, 6:43 PM Certified in Neurology, Clinical Neurophysiology, Sleep Medicine, Pain Medicine and Neuroimaging  [facility] [address] [place], [place] [address] [phone]

## 2021-05-21 ENCOUNTER — Other Ambulatory Visit: Payer: Self-pay | Admitting: Obstetrics and Gynecology

## 2021-05-21 ENCOUNTER — Other Ambulatory Visit (INDEPENDENT_AMBULATORY_CARE_PROVIDER_SITE_OTHER): Payer: Self-pay

## 2021-05-21 DIAGNOSIS — Z79899 Other long term (current) drug therapy: Secondary | ICD-10-CM

## 2021-05-21 DIAGNOSIS — G35 Multiple sclerosis: Secondary | ICD-10-CM

## 2021-05-21 DIAGNOSIS — Z0289 Encounter for other administrative examinations: Secondary | ICD-10-CM

## 2021-05-21 DIAGNOSIS — Z1231 Encounter for screening mammogram for malignant neoplasm of breast: Secondary | ICD-10-CM

## 2021-05-22 LAB — COMPREHENSIVE METABOLIC PANEL
ALT: 72 IU/L — ABNORMAL HIGH (ref 0–32)
AST: 51 IU/L — ABNORMAL HIGH (ref 0–40)
Albumin/Globulin Ratio: 1.4 (ref 1.2–2.2)
Albumin: 4.3 g/dL (ref 3.8–4.8)
Alkaline Phosphatase: 92 IU/L (ref 44–121)
BUN/Creatinine Ratio: 18 (ref 9–23)
BUN: 13 mg/dL (ref 6–24)
Bilirubin Total: 0.3 mg/dL (ref 0.0–1.2)
CO2: 22 mmol/L (ref 20–29)
Calcium: 9.8 mg/dL (ref 8.7–10.2)
Chloride: 101 mmol/L (ref 96–106)
Creatinine, Ser: 0.71 mg/dL (ref 0.57–1.00)
Globulin, Total: 3 g/dL (ref 1.5–4.5)
Glucose: 166 mg/dL — ABNORMAL HIGH (ref 65–99)
Potassium: 3.8 mmol/L (ref 3.5–5.2)
Sodium: 138 mmol/L (ref 134–144)
Total Protein: 7.3 g/dL (ref 6.0–8.5)
eGFR: 104 mL/min/{1.73_m2} (ref >59)

## 2021-05-22 LAB — CBC WITH DIFFERENTIAL/PLATELET
Basophils Absolute: 0 10*3/uL (ref 0.0–0.2)
Basos: 0 %
EOS (ABSOLUTE): 0.2 10*3/uL (ref 0.0–0.4)
Eos: 4 %
Hematocrit: 38.1 % (ref 34.0–46.6)
Hemoglobin: 12.8 g/dL (ref 11.1–15.9)
Immature Grans (Abs): 0 10*3/uL (ref 0.0–0.1)
Immature Granulocytes: 0 %
Lymphocytes Absolute: 0.9 10*3/uL (ref 0.7–3.1)
Lymphs: 15 %
MCH: 26.7 pg (ref 26.6–33.0)
MCHC: 33.6 g/dL (ref 31.5–35.7)
MCV: 80 fL (ref 79–97)
Monocytes Absolute: 0.3 10*3/uL (ref 0.1–0.9)
Monocytes: 6 %
Neutrophils Absolute: 4.2 10*3/uL (ref 1.4–7.0)
Neutrophils: 75 %
Platelets: 267 10*3/uL (ref 150–450)
RBC: 4.79 x10E6/uL (ref 3.77–5.28)
RDW: 14 % (ref 11.7–15.4)
WBC: 5.6 10*3/uL (ref 3.4–10.8)

## 2021-07-09 ENCOUNTER — Ambulatory Visit
Admission: RE | Admit: 2021-07-09 | Discharge: 2021-07-09 | Disposition: A | Discharge status: Home / Self Care | Payer: 59 | Source: Ambulatory Visit

## 2021-07-09 ENCOUNTER — Other Ambulatory Visit: Payer: Self-pay

## 2021-07-09 DIAGNOSIS — Z1231 Encounter for screening mammogram for malignant neoplasm of breast: Secondary | ICD-10-CM

## 2021-09-01 ENCOUNTER — Telehealth: Payer: Self-pay | Admitting: Neurology

## 2021-09-01 NOTE — Telephone Encounter (Signed)
Called patient back. Received 1st yr first dose Mavenclad January 29-Feb 2 and second dose 4 weeks later. Second year due Jan 2023. Advised per Dr. Felecia Shelling, ok to get flu/covid booster. She verbalized understanding.

## 2021-09-01 NOTE — Telephone Encounter (Signed)
Pt called, Would like a call from the nurse to discuss getting flu and Covid booster because taking Mavenclad

## 2021-10-08 ENCOUNTER — Ambulatory Visit: Payer: 59 | Admitting: Neurology

## 2021-10-13 ENCOUNTER — Other Ambulatory Visit: Payer: Self-pay

## 2021-10-13 ENCOUNTER — Encounter: Payer: Self-pay | Admitting: Neurology

## 2021-10-13 ENCOUNTER — Ambulatory Visit: Payer: 59 | Admitting: Neurology

## 2021-10-13 VITALS — BP 132/74 | HR 82 | Ht 63.0 in | Wt 239.0 lb

## 2021-10-13 DIAGNOSIS — R5383 Other fatigue: Secondary | ICD-10-CM

## 2021-10-13 DIAGNOSIS — G35 Multiple sclerosis: Secondary | ICD-10-CM

## 2021-10-13 DIAGNOSIS — R202 Paresthesia of skin: Secondary | ICD-10-CM

## 2021-10-13 DIAGNOSIS — G4700 Insomnia, unspecified: Secondary | ICD-10-CM

## 2021-10-13 DIAGNOSIS — E559 Vitamin D deficiency, unspecified: Secondary | ICD-10-CM

## 2021-10-13 DIAGNOSIS — Z79899 Other long term (current) drug therapy: Secondary | ICD-10-CM

## 2021-10-13 NOTE — Progress Notes (Signed)
p  [facility]  PATIENT: Beverly Beltran DOB: [birth year]  REFERRING DOCTOR OR PCP:  Maurice Small SOURCE: Patient, ED and hospital notes in [place], labs and imaging reports in Epic, personal review of MRI images on PACS.  _________________________________   HISTORICAL  CHIEF COMPLAINT:  Chief Complaint  Patient presents with   Follow-up    Rm 1, alone. Here for 6 month MS f/u, on Mavenclad for MS and tolerating well. Pt reports doing well today, no issues or concerns.     HISTORY OF PRESENT ILLNESS:  Beverly Beltran is a 50 y.o. woman with relapsing remitting MS.  Update 10/13/2021 She had her first dose Mavenclad January 29-Feb 2 and second dose 4 weeks later.    She had no side effects while taking the medication or afterwards..    She had CBC / DIff in 05/21/2021 (lymphocytes normal at 0.9).   AST = 51 and ALT = 72 (slightly elevated).     She took the first week January 29 -February 2 and second course late February.  She feels her MS is stable.  Specifically she has no exacerbations or new symptoms.  Gait is fine and she can walk a few miles at a moderate pace but can't keep u a fast pace.  Balance is fine.      She does have mild reduced balance which has been present since the onset.  She uses the bannister on stairs .     She has mild finger numbness but nothing too bothersome.    It is worse when she writes more.      Bladder function is fine.   Vision is fine.     She has minimal fatigue.    Cognition and mood are fine.    She had cervical cancer stage FICO 1A1 and had a curative hysterectomy in 2018 thus she no longer has a cervix.       MS history: Around 01/28/2016, she had the onset of slurred speech and also felt more fatigue. Additionally, there was a dysesthetic numbness going into the left arm towards the thumb. She notes that she had just moved into a new house and had done quite a bit of activity over the preceding couple of weeks when symptoms  did not improve after couple days, she went to the emergency room. There, she was admitted. The initial CT scan was normal but MRI of the brain was consistent with MS. MRI of the spine did not show any MS lesions but did show significant degenerative changes at C5-C6 as well as T7-T8, T8-T9 and T9-T10..   She received 5 days of steroids and was discharged home.  By the third or fourth day of IV steroids, symptoms were much better.   She was discharged after 5 days of steroids.  She entered the ALKS-8700 evolve study (comparison with Tecfidera for 5 weeks) and the open label extension for the next 2 years.  Due to insurance issues, Vumerity was not available to her and she switched to Tecfidera in 2019.  MRI of the brain in 2020 showed 1 breakthrough lesion.  MRI of the brain in December 2021 showed a second breakthrough lesion.  After discussion of options, she opted to switch to [facility] December 2021  Imaging review:  MRI of the brain 02/03/2016 without contrast and 02/04/2016 with contrast show an enhancing large focus in the left frontal lobe, is also hyperintense on diffusion-weighted images. There are several other T2/FLAIR hyperintense  foci in the periventricular, deep and juxtacortical white matter.   The MRI of the cervical spine 02/03/2016 showed normal spinal cord and shows degenerative changes at C5-C6 causing severe right and moderate left foraminal narrowing that could lead to right C6 nerve root compression there are milder degenerative changes at C3-C4 and C4-C5 with less potential for nerve root impingement.    MRI of the thoracic spine 02/03/2016 showed central disc extrusions at T7-T8, T8-T9 and T9-T10 deforming the spinal cord was in mild spinal stenosis of these levels. The spinal cord had normal signal.  MRI of the brain 04/22/2016, 06/09/2016, 05/11/2017 and 04/18/2018 with the drug study protocol showed no new lesions.  MRI of the brain 08/05/2019 shows multiple T2/FLAIR hyperintense foci  in the periventricular, juxtacortical and deep white matter in a pattern and configuration consistent with chronic demyelinating plaque associated with multiple sclerosis.  None of these foci enhance or appear to be acute though one focus in the posterior left frontal lobe was not present on the MRI from 04/18/2018 and represents some disease activity.  MRI of the brain 09/20/2020 shows T2/FLAIR hyperintense foci in the cerebral hemispheres in a pattern and configuration consistent with chronic demyelinating plaque associated with multiple sclerosis. None of the foci were acute or enhance. However, one focus in the right frontal lobe seen on the current MRI was not present on the 08/05/2019 MRI and likely represents interval MS activity.   REVIEW OF SYSTEMS: Constitutional: No fevers, chills, sweats, or change in appetite   she notes fatigue, insomnia and daytime hypersomnia Eyes: No visual changes, double vision, eye pain Ear, nose and throat: No hearing loss, ear pain, nasal congestion, sore throat Cardiovascular: No chest pain, palpitations Respiratory:  No shortness of breath at rest or with exertion.   No wheezes.   She snores GastrointestinaI: No nausea, vomiting, diarrhea, abdominal pain, fecal incontinence.  Some constipation Genitourinary:  No dysuria, urinary retention.  She has frequency and nocturia. Musculoskeletal:  No neck pain, back pain Integumentary: No rash, pruritus, skin lesions Neurological: as above Psychiatric: No depression at this time.  No anxiety now (had in past) Endocrine: No palpitations, diaphoresis, change in appetite, change in weigh or increased thirst Hematologic/Lymphatic:  No anemia, purpura, petechiae. Allergic/Immunologic: No itchy/runny eyes, nasal congestion, recent allergic reactions, rashes  ALLERGIES: No Known Allergies  HOME MEDICATIONS:  Current Outpatient Medications:    allopurinol (ZYLOPRIM) 100 MG tablet, Take 100 mg by mouth daily., Disp: ,  Rfl:    amLODIPine-Valsartan-HCTZ 10-320-25 MG TABS, Take 1 tablet by mouth daily., Disp: , Rfl:    cetirizine (ZYRTEC) 10 MG tablet, Take 10 mg by mouth at bedtime. , Disp: , Rfl:    Cladribine (MAVENCLAD, 10 TABS, PO), Take by mouth., Disp: , Rfl:    cyclobenzaprine (FLEXERIL) 5 MG tablet, TAKE 1 TABLET BY MOUTH EVERY DAY AT BEDTIME, Disp: 30 tablet, Rfl: 11   fluticasone (FLONASE) 50 MCG/ACT nasal spray, Place 1 spray into both nostrils daily., Disp: , Rfl:    ibuprofen (ADVIL,MOTRIN) 600 MG tablet, Take 1 tablet (600 mg total) by mouth every 6 (six) hours as needed., Disp: 30 tablet, Rfl: 0   Lactobacillus (PROBIOTIC ACIDOPHILUS PO), Take 1 capsule by mouth at bedtime. 1 BILLION CFUs, Disp: , Rfl:    meloxicam (MOBIC) 15 MG tablet, Take 15 mg by mouth daily., Disp: , Rfl:    Multiple Vitamin (MULTI VITAMIN DAILY PO), Take by mouth., Disp: , Rfl:    pravastatin (PRAVACHOL) 20 MG tablet,  Take 20 mg by mouth at bedtime. , Disp: , Rfl: 3   sertraline (ZOLOFT) 50 MG tablet, TAKE 1 TABLET (50 MG) BY MOUTH DAILY AT NIGHT., Disp: , Rfl: 3  PAST MEDICAL HISTORY: Past Medical History:  Diagnosis Date   Anxiety    Cancer ([place]) 01/2017   cervical cancer    Depression    Diabetes ([place])    Genital warts    Hypercholesteremia    Hypertension    Multiple sclerosis ([place]) 01/2016   Multiple sclerosis ([place])    Numbness    occassional numbnes in in extremities   Vision abnormalities     PAST SURGICAL HISTORY: Past Surgical History:  Procedure Laterality Date   ABDOMINAL HYSTERECTOMY     CERVICAL BIOPSY  W/ LOOP ELECTRODE EXCISION     COLPOSCOPY     DILATION AND CURETTAGE OF UTERUS  1994   ROBOTIC ASSISTED TOTAL HYSTERECTOMY WITH BILATERAL SALPINGO OOPHERECTOMY Bilateral 02/22/2017   Procedure: XI ROBOTIC ASSISTED TOTAL HYSTERECTOMY WITH BILATERAL SALPINGECTOMY;  Surgeon: Everitt Amber, MD;  Location: [facility];  Service: Gynecology;  Laterality: Bilateral;    FAMILY HISTORY: Family History   Problem Relation Age of Onset   Diabetes Mellitus II Mother    COPD Father     SOCIAL HISTORY:  Social History   Socioeconomic History   Marital status: Married    Spouse name: Not on file   Number of children: Not on file   Years of education: Not on file   Highest education level: Not on file  Occupational History   Not on file  Tobacco Use   Smoking status: Never   Smokeless tobacco: Never  Vaping Use   Vaping Use: Never used  Substance and Sexual Activity   Alcohol use: No   Drug use: No   Sexual activity: Yes    Partners: Male    Birth control/protection: Surgical    Comment: hysterectomy   Other Topics Concern   Not on file  Social History Narrative   Not on file   Social Determinants of Health   Financial Resource Strain: Not on file  Food Insecurity: Not on file  Transportation Needs: Not on file  Physical Activity: Not on file  Stress: Not on file  Social Connections: Not on file  Intimate Partner Violence: Not on file     PHYSICAL EXAM  Vitals:   10/13/21 0814  BP: 132/74  Pulse: 82  Weight: 239 lb (108.4 kg)  Height: 5' 3"  (1.6 m)    Body mass index is 42.34 kg/m.  VISUAL Acuity:  20/25 OS  20/20 OD  General: The patient is well-developed and well-nourished and in no acute distress   Neurologic Exam  Mental status: The patient is alert and oriented x 3 at the time of the examination. The patient has apparent normal recent and remote memory, with an apparently normal attention span and concentration ability.   Speech is normal.  Cranial nerves: Extraocular movements are full.   She has normal facial strength and sensation.  Trapezius strength is normal.. No obvious hearing deficits.  Motor:  Muscle bulk is normal.   Tone is normal. Strength is  5 / 5 in all 4 extremities.   Sensory: Sensory testing is intact to touch, temperature and vibration.   Coordination: Cerebellar testing reveals good finger-nose-finger and heel-to-shin  bilaterally.  Gait and station: Station is normal.   The gait is normal.  The tandem gait is mildly wide..  Romberg is negative.Marland Kitchen  Reflexes: Deep tendon reflexes are symmetric and normal bilaterally.     EDSS equals 1.5 (visual, cerebellar and ambulation are 1)     DIAGNOSTIC DATA (LABS, IMAGING, TESTING) - I reviewed patient records, labs, notes, testing and imaging myself where available.  Lab Results  Component Value Date   WBC 5.6 05/21/2021   HGB 12.8 05/21/2021   HCT 38.1 05/21/2021   MCV 80 05/21/2021   PLT 267 05/21/2021      Component Value Date/Time   NA 138 05/21/2021 1320   K 3.8 05/21/2021 1320   CL 101 05/21/2021 1320   CO2 22 05/21/2021 1320   GLUCOSE 166 (H) 05/21/2021 1320   GLUCOSE 154 (H) 02/23/2017 0426   BUN 13 05/21/2021 1320   CREATININE 0.71 05/21/2021 1320   CALCIUM 9.8 05/21/2021 1320   PROT 7.3 05/21/2021 1320   ALBUMIN 4.3 05/21/2021 1320   AST 51 (H) 05/21/2021 1320   ALT 72 (H) 05/21/2021 1320   ALKPHOS 92 05/21/2021 1320   BILITOT 0.3 05/21/2021 1320   GFRNONAA >60 02/23/2017 0426   GFRAA >60 02/23/2017 0426   ___________________________________________________   Multiple sclerosis (HCC)  High risk medication use  Paresthesia  Other fatigue  Insomnia, unspecified type  1.  She tolerated the first courses of [place] without difficulty and will take her second year in January 2023..  We will check blood work for chronic infections, CBC with differential and other function test. 2.   Stay active and exercise as tolerated.   3.   She signed the amended ICF.   4.   Return in 3 months or sooner if there are new or worsening neurologic symptoms.     Kemar Pandit A. Felecia Shelling, MD, PhD 76/16/0737, 1:06 AM Certified in Neurology, Clinical Neurophysiology, Sleep Medicine, Pain Medicine and Neuroimaging  [facility] [address] [place], [place] [address] [phone]

## 2021-10-14 ENCOUNTER — Telehealth: Payer: Self-pay | Admitting: *Deleted

## 2021-10-14 NOTE — Telephone Encounter (Signed)
-----   Message from Britt Bottom, MD sent at 10/14/2021  8:48 AM EST ----- Lab work so far looks fine.  TB is still pending.  We can go ahead and send in next years Mavenclad-okay to wait until next week to get better for insurance preauthorization purposes.  Please let her know that the liver tests look up a little bit better than when we tested them in August though they are still mildly elevated.  The lymphocyte count looks great.  Vitamin D was low normal and I would like her to take 5000 units daily OTC

## 2021-10-14 NOTE — Telephone Encounter (Signed)
Called and spoke w/ pt about results per Dr. Garth Bigness note. She verbalized understanding and appreciation.

## 2021-10-15 LAB — CBC WITH DIFFERENTIAL/PLATELET
Basophils Absolute: 0 10*3/uL (ref 0.0–0.2)
Basos: 0 %
EOS (ABSOLUTE): 0.2 10*3/uL (ref 0.0–0.4)
Eos: 3 %
Hematocrit: 41.9 % (ref 34.0–46.6)
Hemoglobin: 13.8 g/dL (ref 11.1–15.9)
Immature Grans (Abs): 0 10*3/uL (ref 0.0–0.1)
Immature Granulocytes: 0 %
Lymphocytes Absolute: 1.2 10*3/uL (ref 0.7–3.1)
Lymphs: 19 %
MCH: 26.7 pg (ref 26.6–33.0)
MCHC: 32.9 g/dL (ref 31.5–35.7)
MCV: 81 fL (ref 79–97)
Monocytes Absolute: 0.5 10*3/uL (ref 0.1–0.9)
Monocytes: 7 %
Neutrophils Absolute: 4.6 10*3/uL (ref 1.4–7.0)
Neutrophils: 71 %
Platelets: 252 10*3/uL (ref 150–450)
RBC: 5.16 x10E6/uL (ref 3.77–5.28)
RDW: 14.5 % (ref 11.7–15.4)
WBC: 6.5 10*3/uL (ref 3.4–10.8)

## 2021-10-15 LAB — QUANTIFERON-TB GOLD PLUS
QuantiFERON Mitogen Value: 6.33 IU/mL
QuantiFERON Nil Value: 0.04 IU/mL
QuantiFERON TB1 Ag Value: 0.06 IU/mL
QuantiFERON TB2 Ag Value: 0.06 IU/mL
QuantiFERON-TB Gold Plus: NEGATIVE

## 2021-10-15 LAB — VITAMIN D 25 HYDROXY (VIT D DEFICIENCY, FRACTURES): Vit D, 25-Hydroxy: 31.8 ng/mL (ref 30.0–100.0)

## 2021-10-15 LAB — HIV ANTIBODY (ROUTINE TESTING W REFLEX): HIV Screen 4th Generation wRfx: NONREACTIVE

## 2021-10-15 LAB — HEPATIC FUNCTION PANEL
ALT: 64 IU/L — ABNORMAL HIGH (ref 0–32)
AST: 46 IU/L — ABNORMAL HIGH (ref 0–40)
Albumin: 4.4 g/dL (ref 3.8–4.8)
Alkaline Phosphatase: 89 IU/L (ref 44–121)
Bilirubin Total: 0.5 mg/dL (ref 0.0–1.2)
Bilirubin, Direct: 0.16 mg/dL (ref 0.00–0.40)
Total Protein: 7.7 g/dL (ref 6.0–8.5)

## 2021-10-15 LAB — HEPATITIS B SURFACE ANTIBODY,QUALITATIVE: Hep B Surface Ab, Qual: NONREACTIVE

## 2021-10-15 LAB — HEPATITIS B CORE ANTIBODY, TOTAL: Hep B Core Total Ab: NEGATIVE

## 2021-10-15 LAB — HEPATITIS B SURFACE ANTIGEN: Hepatitis B Surface Ag: NEGATIVE

## 2021-10-15 LAB — HEPATITIS C ANTIBODY: Hep C Virus Ab: <0.1 s/co ratio (ref 0.0–0.9)

## 2021-10-20 ENCOUNTER — Telehealth: Payer: Self-pay

## 2021-10-20 ENCOUNTER — Telehealth: Payer: Self-pay | Admitting: Neurology

## 2021-10-20 NOTE — Telephone Encounter (Signed)
MR Brain w/wo contrast Dr. Felecia Shelling [facility] Josem Kaufmann: [place] via [facility] website. Patient is scheduled at [facility] for 10/21/21.

## 2021-10-20 NOTE — Telephone Encounter (Signed)
I called patient. I advised her of the labs results and recommendations.  I will send in the [facility] second year start form. Patient will watch for [employer]' phone calls.  Mavenclad start form faxed to [place]. Received a receipt of confirmation.   Submitted PA for [facility] to [facility]. Waiting on clinical question set. Key: [id number].

## 2021-10-20 NOTE — Telephone Encounter (Signed)
-----   Message from Britt Bottom, MD sent at 10/15/2021  8:56 AM EST ----- Labs are fine for year 2 Mavenclad.  Vit D is low normal -- take 5000 U otc daily

## 2021-10-21 ENCOUNTER — Ambulatory Visit: Payer: 59

## 2021-10-21 ENCOUNTER — Other Ambulatory Visit: Payer: Self-pay

## 2021-10-21 DIAGNOSIS — R202 Paresthesia of skin: Secondary | ICD-10-CM

## 2021-10-21 DIAGNOSIS — Z79899 Other long term (current) drug therapy: Secondary | ICD-10-CM | POA: Diagnosis not present

## 2021-10-21 DIAGNOSIS — G35 Multiple sclerosis: Secondary | ICD-10-CM | POA: Diagnosis not present

## 2021-10-21 MED ORDER — GADOBENATE DIMEGLUMINE 529 MG/ML IV SOLN
15.0000 mL | Freq: Once | INTRAVENOUS | Status: AC | PRN
  Administered 2021-10-21: 15 mL via INTRAVENOUS

## 2021-10-27 NOTE — Telephone Encounter (Signed)
Completed PA for [facility] and sent it to [facility]. Key: [id number].

## 2021-10-28 IMAGING — MG MM DIGITAL SCREENING BILAT W/ TOMO AND CAD
8 series · 8 of 24 positions shown · non-contrast
Comparison: Previous exam(s).

CLINICAL DATA: Screening.

EXAM:
DIGITAL SCREENING BILATERAL MAMMOGRAM WITH TOMOSYNTHESIS AND CAD
TECHNIQUE: Bilateral screening digital craniocaudal and mediolateral oblique
mammograms were obtained. Bilateral screening digital breast
tomosynthesis was performed. The images were evaluated with
computer-aided detection.

[R MLO synth-2D]
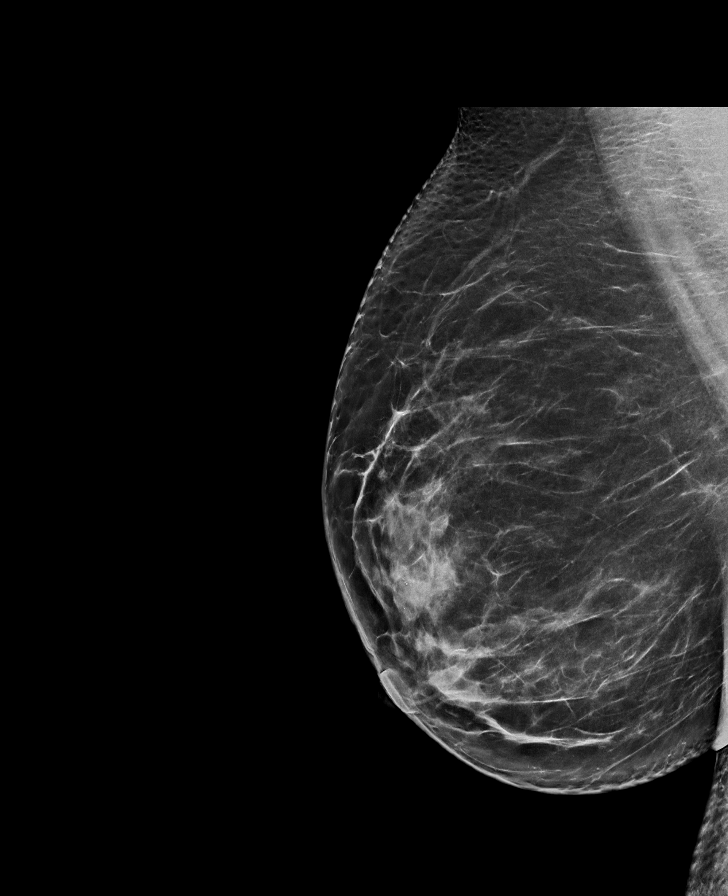

[R CC synth-2D]
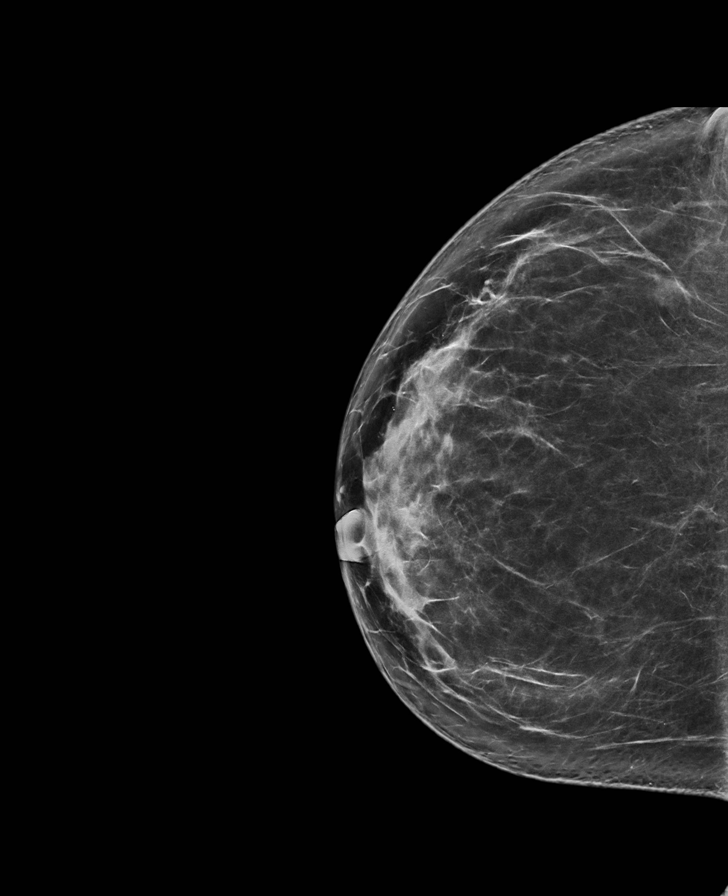

[L MLO synth-2D]
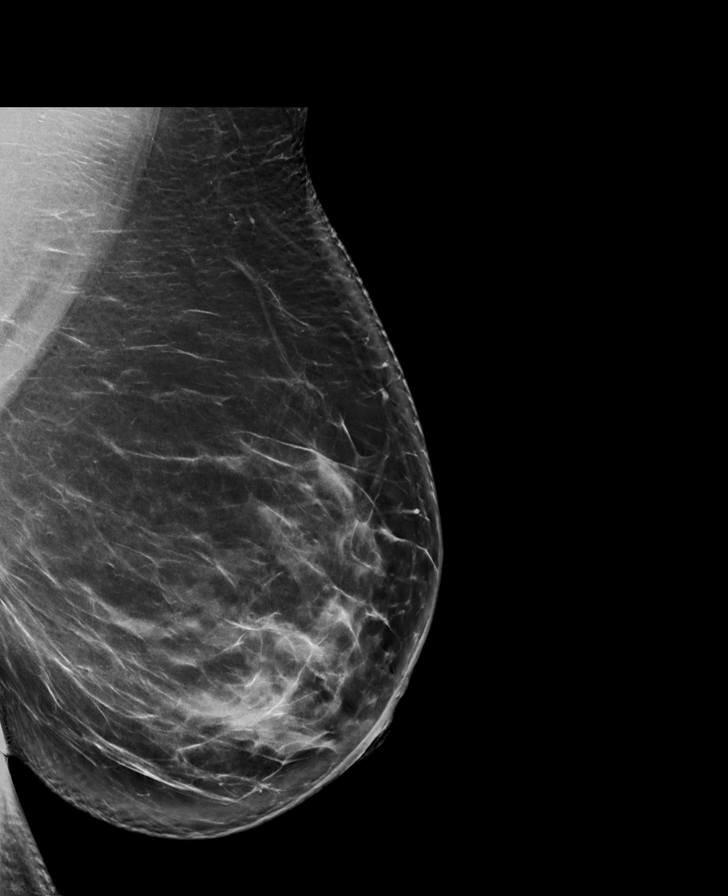

[L CC synth-2D]
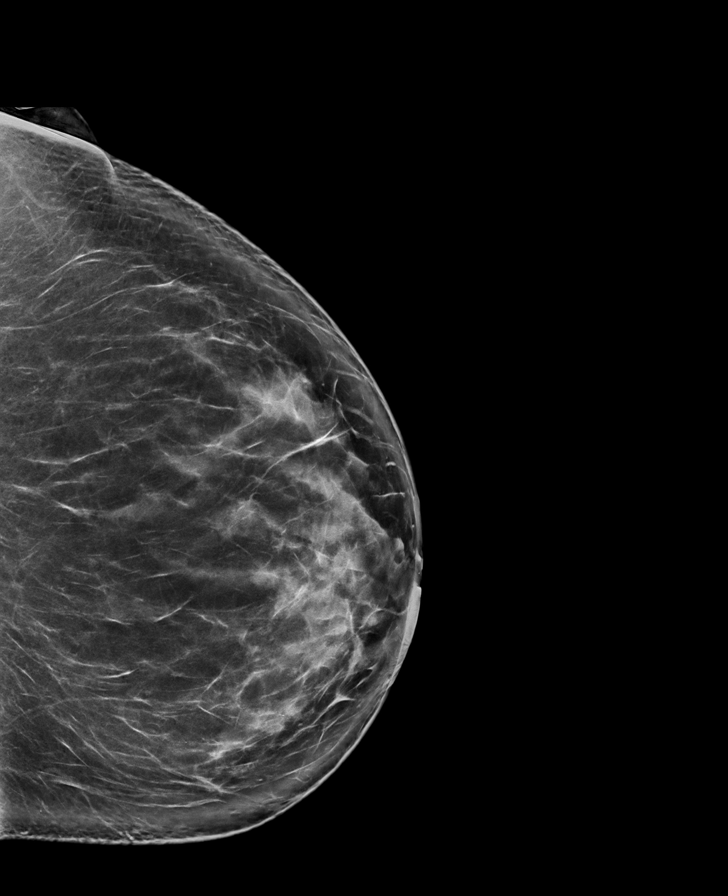

[R MLO tomo · tomo slice 46/91.0]
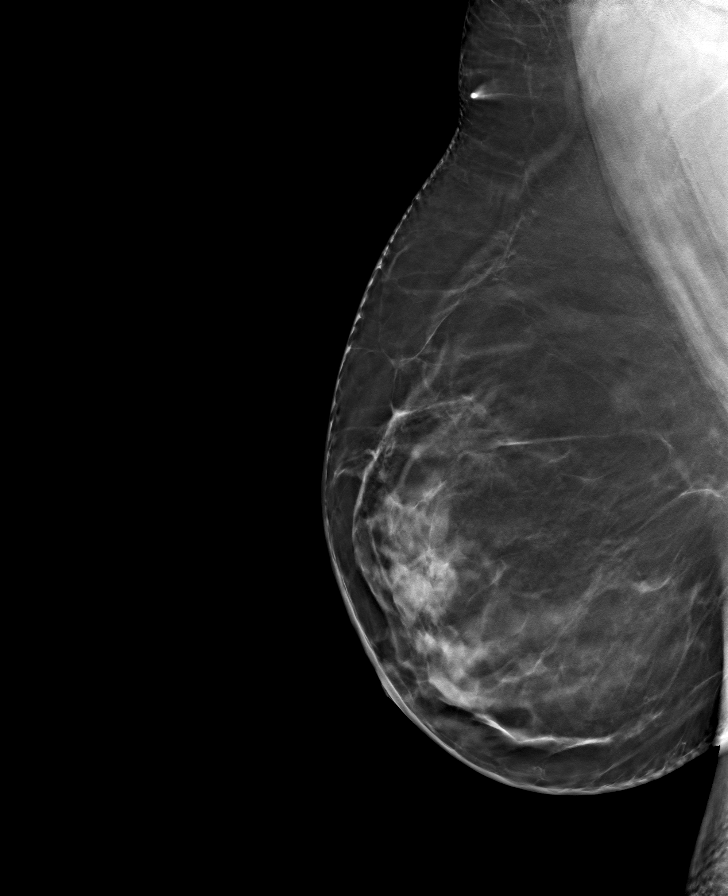

[L CC tomo · tomo slice 50/99.0]
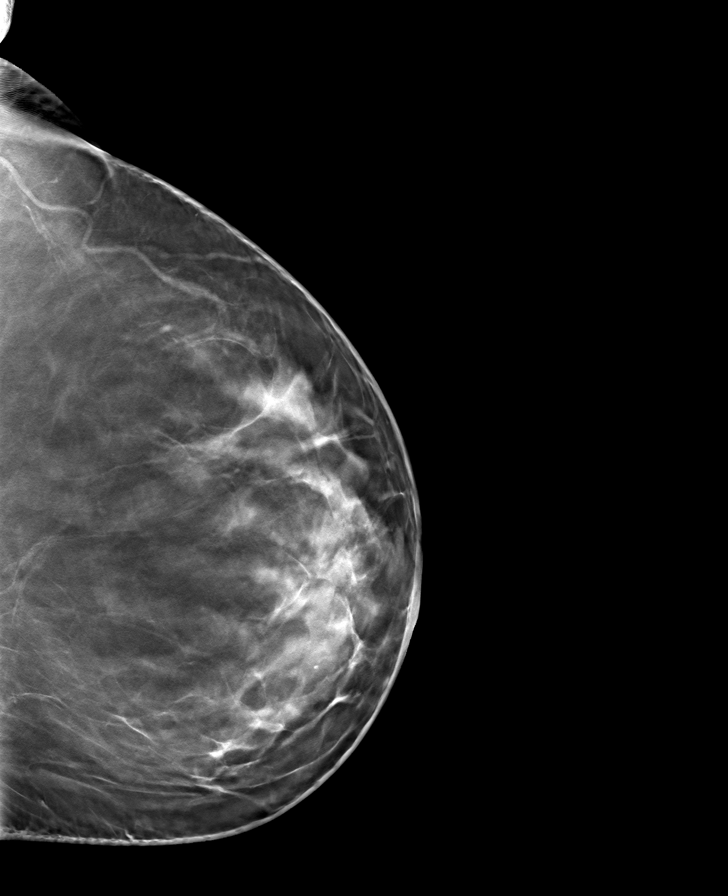

[R CC tomo · tomo slice 45/88.0]
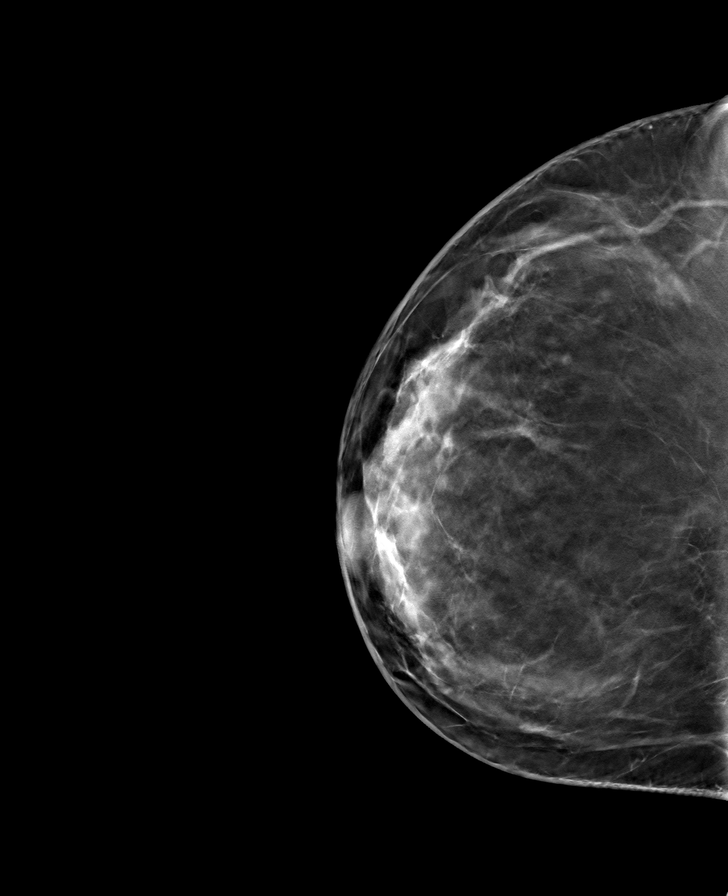

[L MLO tomo · tomo slice 54/107.0]
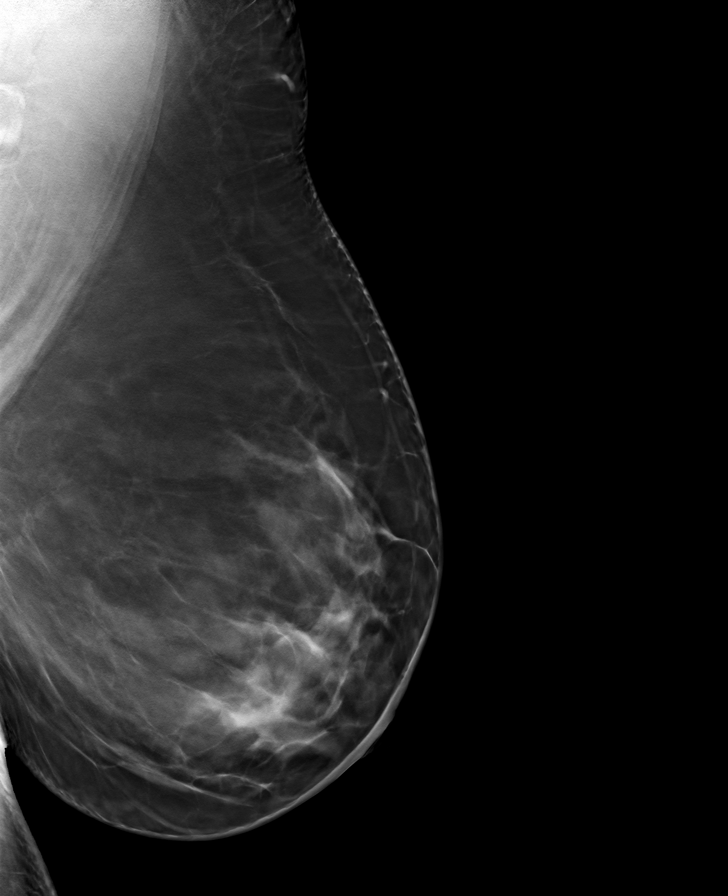

[8 of 24 positions shown; findings below may reference images not displayed]

ACR Breast Density Category b: There are scattered areas of
fibroglandular density.
FINDINGS: There are no findings suspicious for malignancy.
IMPRESSION: No mammographic evidence of malignancy. A result letter of this
screening mammogram will be mailed directly to the patient.

RECOMMENDATION:
Screening mammogram in one year. (Code:51-O-LD2)

BI-RADS CATEGORY  1: Negative.

## 2021-11-02 NOTE — Telephone Encounter (Signed)
I called [employer] to provide them this PA information but they do not appear to be open, perhaps because of the holiday. Faxed PA approval to them. Received a receipt of confirmation.

## 2021-11-02 NOTE — Telephone Encounter (Signed)
PA for Beverly Beltran has been approved from 10/28/2021-12/12/2021 by [facility].

## 2021-11-22 ENCOUNTER — Other Ambulatory Visit: Payer: Self-pay | Admitting: Neurology

## 2021-11-30 ENCOUNTER — Telehealth: Payer: Self-pay

## 2021-11-30 NOTE — Telephone Encounter (Signed)
Received PA request for mavenclad from [facility]. Completed and faxed back to [facility].

## 2021-11-30 NOTE — Telephone Encounter (Signed)
I called patient. She has completed her first week of Month 1 Year 2 of mavenclad. She tolerated it well. I reminded her of her appointment with Dr. Felecia Shelling on 02/16/2022. Pt verbalized understanding.

## 2021-12-01 NOTE — Telephone Encounter (Signed)
PA for mavenclad was approved from 11/30/2021-01/14/2022 by [facility]. PA#: [id number].

## 2022-01-07 NOTE — Progress Notes (Signed)
51 y.o. [id number] Married White or Caucasian Not Hispanic or Latino female here for annual exam.  H/O TLH/BS for cervical cancer with Dr Denman George. Final pathology was stage 1A1 cervical cancer. No vaginal bleeding. No dyspareunia.  ?  ?H/O MS, stable, normal bowel and bladder function.  ? ?She went from prediabetes to diabetes back to prediabetes. She has lost 15-20 lbs since 12/23. She cut out soda and cut back on carbs and decreased her portion size.  ? ?Patient's last menstrual period was 02/14/2017.          ?Sexually active: Yes.    ?The current method of family planning is status post hysterectomy.    ?Exercinone sing: No.   None  ?Smoker:  no ? ?Health Maintenance: ?Pap:   01/04/2019 Neg HR HPV Neg, ?            12/14/2017 WNL NEG HPV ?             12-08-16 ASCUS + HR HPV colposcopy CIN III LEEP  suspicious for invasive cervical cancer- sent to  GYN oncology - had hysterectomy  ?History of abnormal Pap:  yes ?MMG:  07/15/21 density B Bi-rads 1 neg  ?BMD:   none  ?Colonoscopy: none  ?TDaP:  09/29/21  ?Gardasil: n/a ? ? reports that she has never smoked. She has never used smokeless tobacco. She reports that she does not drink alcohol and does not use drugs. She is a Agricultural engineer. Son is 68, daughter is 34. Both kids are working and living at home. ? ?Past Medical History:  ?Diagnosis Date  ? Anxiety   ? Cancer ([place]) 01/2017  ? cervical cancer   ? Depression   ? Diabetes ([place])   ? Genital warts   ? Hypercholesteremia   ? Hypertension   ? Multiple sclerosis ([place]) 01/2016  ? Multiple sclerosis ([place])   ? Numbness   ? occassional numbnes in in extremities  ? Vision abnormalities   ? ? ?Past Surgical History:  ?Procedure Laterality Date  ? ABDOMINAL HYSTERECTOMY    ? CERVICAL BIOPSY  W/ LOOP ELECTRODE EXCISION    ? COLPOSCOPY    ? DILATION AND CURETTAGE OF UTERUS  1994  ? ROBOTIC ASSISTED TOTAL HYSTERECTOMY WITH BILATERAL SALPINGO OOPHERECTOMY Bilateral 02/22/2017  ? Procedure: XI ROBOTIC ASSISTED TOTAL HYSTERECTOMY WITH  BILATERAL SALPINGECTOMY;  Surgeon: Everitt Amber, MD;  Location: [facility];  Service: Gynecology;  Laterality: Bilateral;  ? ? ?Current Outpatient Medications  ?Medication Sig Dispense Refill  ? allopurinol (ZYLOPRIM) 100 MG tablet Take 100 mg by mouth daily.    ? amLODIPine-Valsartan-HCTZ 10-320-25 MG TABS Take 1 tablet by mouth daily.    ? cetirizine (ZYRTEC) 10 MG tablet Take 10 mg by mouth at bedtime.     ? Cladribine (MAVENCLAD, 10 TABS, PO) Take by mouth.    ? cyclobenzaprine (FLEXERIL) 5 MG tablet TAKE 1 TABLET BY MOUTH EVERY DAY AT BEDTIME 30 tablet 5  ? fluticasone (FLONASE) 50 MCG/ACT nasal spray Place 1 spray into both nostrils daily.    ? ibuprofen (ADVIL,MOTRIN) 600 MG tablet Take 1 tablet (600 mg total) by mouth every 6 (six) hours as needed. 30 tablet 0  ? Lactobacillus (PROBIOTIC ACIDOPHILUS PO) Take 1 capsule by mouth at bedtime. 1 BILLION CFUs    ? meloxicam (MOBIC) 15 MG tablet Take 15 mg by mouth daily.    ? Multiple Vitamin (MULTI VITAMIN DAILY PO) Take by mouth.    ? pravastatin (PRAVACHOL) 20 MG tablet Take 20 mg  by mouth at bedtime.   3  ? sertraline (ZOLOFT) 50 MG tablet TAKE 1 TABLET (50 MG) BY MOUTH DAILY AT NIGHT.  3  ? VITAMIN D PO Take 5,000 Units by mouth daily.    ? ?No current facility-administered medications for this visit.  ? ? ?Family History  ?Problem Relation Age of Onset  ? Diabetes Mellitus II Mother   ? COPD Father   ? ? ?Review of Systems  ?All other systems reviewed and are negative. ? ?Exam:   ?BP 110/66   Pulse 88   Ht 5' 3"  (1.6 m)   Wt 223 lb (101.2 kg)   LMP 02/14/2017   SpO2 100%   BMI 39.50 kg/m?   Weight change: @WEIGHTCHANGE @ Height:   Height: 5' 3"  (160 cm)  ?Ht Readings from Last 3 Encounters:  ?01/18/22 5' 3"  (1.6 m)  ?01/14/22 5' 3"  (1.6 m)  ?10/13/21 5' 3"  (1.6 m)  ? ? ?General appearance: alert, cooperative and appears stated age ?Head: Normocephalic, without obvious abnormality, atraumatic ?Neck: no adenopathy, supple, symmetrical, trachea midline and  thyroid normal to inspection and palpation ?Lungs: clear to auscultation bilaterally ?Cardiovascular: regular rate and rhythm ?Breasts: normal appearance, no masses or tenderness ?Abdomen: soft, non-tender; non distended,  no masses,  no organomegaly ?Extremities: extremities normal, atraumatic, no cyanosis or edema ?Skin: Skin color, texture, turgor normal. No rashes or lesions ?Lymph nodes: Cervical, supraclavicular, and axillary nodes normal. ?No abnormal inguinal nodes palpated ?Neurologic: Grossly normal ? ? ?Pelvic: External genitalia:  no lesions ?             Urethra:  normal appearing urethra with no masses, tenderness or lesions ?             Bartholins and Skenes: normal    ?             Vagina:erythematous appearing vagina with a large amount of thick, clumpy vaginal discharge, no lesions ?             Cervix: absent ?              ?Bimanual Exam:  Uterus:  uterus absent ?             Adnexa: no mass, fullness, tenderness ?              Rectovaginal: Confirms ?              Anus:  normal sphincter tone, no lesions ? ?Gae Dry chaperoned for the exam. ? ? ?1. Well woman exam ?Discussed breast self exam ?Discussed calcium and vit D intake ?Mammogram in the fall ?Labs are UTD ? ?2. History of cervical cancer ?- Cytology - PAP ? ?3. Screening for vaginal cancer ?- Cytology - PAP ? ?4. Colon cancer screening ?- Ambulatory referral to Gastroenterology ? ?5. Vaginal discharge ?Clinically appears to have a yeast infection. She denies symptoms but desires testing ?- WET PREP FOR [place], YEAST, CLUE ? ?6. Yeast vaginitis ?She will treat if she develops symptoms ?- fluconazole (DIFLUCAN) 150 MG tablet; Take 1 tablet (150 mg total) by mouth once for 1 dose. Take one tablet.  Repeat in 72 hours if symptoms are not completely resolved.  Dispense: 2 tablet; Refill: 0 ? ?

## 2022-01-14 ENCOUNTER — Encounter: Payer: Self-pay | Admitting: Neurology

## 2022-01-14 ENCOUNTER — Ambulatory Visit: Payer: 59 | Admitting: Neurology

## 2022-01-14 VITALS — BP 130/85 | HR 83 | Ht 63.0 in | Wt 225.0 lb

## 2022-01-14 DIAGNOSIS — R5383 Other fatigue: Secondary | ICD-10-CM

## 2022-01-14 DIAGNOSIS — Z79899 Other long term (current) drug therapy: Secondary | ICD-10-CM | POA: Diagnosis not present

## 2022-01-14 DIAGNOSIS — G35 Multiple sclerosis: Secondary | ICD-10-CM | POA: Diagnosis not present

## 2022-01-14 NOTE — Progress Notes (Signed)
? ?p ? ?[facility] ? ?PATIENT: Beverly Beltran ?DOB: [birth year] ? ?REFERRING DOCTOR OR PCP:  Maurice Small ?SOURCE: Patient, ED and hospital notes in [place], labs and imaging reports in Epic, personal review of MRI images on PACS. ? ?_________________________________ ? ? ?HISTORICAL ? ?CHIEF COMPLAINT:  ?Chief Complaint  ?Patient presents with  ? Follow-up  ?  Rm 2, alone. Here for 3 month MS f/u, on Mavenclad. Completed year 2 of [place] and tolerated well.   ? ? ?HISTORY OF PRESENT ILLNESS:  ?Beverly Beltran is a 51 y.o. woman with relapsing remitting MS. ? ?Update 01/14/2022 ?She had her first year dose Mavenclad January 29-Feb 2 and early March 2022.   Second year was 11/11/2021-11/15/2021 and 12/09/2021-12/13/2021.    She had no side effects while taking the medication or afterwards.  She had CBC / DIff in 10/13/2021 (lymphocytes normal at 1.2). and  AST = 46 and ALT = 54(slightly elevated).     She took the first week January 29 -February 2 and second course late February. ? ?She feels her MS is stable.  Specifically she has no exacerbations or new symptoms.   ? ?Hr gait is fine and she can walk a few miles at a moderate pace but can't keep u a fast pace. She keeps up with others but may lag slightly for longer distance.   Balance is fine.    She does have mild reduced balance which has been present since the onset.  She uses the bannister on stairs .   She has mild finger numbness but nothing too bothersome.    It is worse when she writes more.    Bladder function is the same with minimal stress incontinence - not bad enough to treat.  .   Vision is fine.    ? ?She has minimal fatigue.    Cognition and mood are fine.   ? ?She had cervical cancer stage FICO 1A1 and had a curative hysterectomy in 2018 thus she no longer has a cervix.     ? ?She had elevated HgbA1c and was able to lose 15 pounds with improvement.   She is told she is pre-diabetic ad is not on a DM medication.   ? ? ?MS history: ?Around  01/28/2016, she had the onset of slurred speech and also felt more fatigue. Additionally, there was a dysesthetic numbness going into the left arm towards the thumb. She notes that she had just moved into a new house and had done quite a bit of activity over the preceding couple of weeks when symptoms did not improve after couple days, she went to the emergency room. There, she was admitted. The initial CT scan was normal but MRI of the brain was consistent with MS. MRI of the spine did not show any MS lesions but did show significant degenerative changes at C5-C6 as well as T7-T8, T8-T9 and T9-T10..   She received 5 days of steroids and was discharged home.  By the third or fourth day of IV steroids, symptoms were much better.   She was discharged after 5 days of steroids. ? ?She entered the ALKS-8700 evolve study (comparison with Tecfidera for 5 weeks) and the open label extension for the next 2 years.  Due to insurance issues, Vumerity was not available to her and she switched to Tecfidera in 2019.  MRI of the brain in 2020 showed 1 breakthrough lesion.  MRI of the brain in December 2021 showed a second breakthrough lesion.  After discussion of options, she opted to switch to [facility] December 2021 ? ?Imaging review:  ?MRI of the brain 02/03/2016 without contrast and 02/04/2016 with contrast show an enhancing large focus in the left frontal lobe, is also hyperintense on diffusion-weighted images. There are several other T2/FLAIR hyperintense foci in the periventricular, deep and juxtacortical white matter.  ? ?The MRI of the cervical spine 02/03/2016 showed normal spinal cord and shows degenerative changes at C5-C6 causing severe right and moderate left foraminal narrowing that could lead to right C6 nerve root compression there are milder degenerative changes at C3-C4 and C4-C5 with less potential for nerve root impingement.   ? ?MRI of the thoracic spine 02/03/2016 showed central disc extrusions at T7-T8, T8-T9  and T9-T10 deforming the spinal cord was in mild spinal stenosis of these levels. The spinal cord had normal signal. ? ?MRI of the brain 04/22/2016, 06/09/2016, 05/11/2017 and 04/18/2018 with the drug study protocol showed no new lesions. ? ?MRI of the brain 08/05/2019 shows multiple T2/FLAIR hyperintense foci in the periventricular, juxtacortical and deep white matter in a pattern and configuration consistent with chronic demyelinating plaque associated with multiple sclerosis.  None of these foci enhance or appear to be acute though one focus in the posterior left frontal lobe was not present on the MRI from 04/18/2018 and represents some disease activity. ? ?MRI of the brain 09/20/2020 shows T2/FLAIR hyperintense foci in the cerebral hemispheres in a pattern and configuration consistent with chronic demyelinating plaque associated with multiple sclerosis. None of the foci were acute or enhance. However, one focus in the right frontal lobe seen on the current MRI was not present on the 08/05/2019 MRI and likely represents interval MS activity. ? ?MRI of the brain 10/21/2021  and showed  Scattered T2/FLAIR hyperintense foci in the periventricular, juxtacortical and deep white matter of the hemispheres.  None of the foci appear to be acute.  They do not enhance.  Compared to the MRI from 09/18/2020, there are no new lesions.   No acute findings.  Normal enhancement pattern. ? ? ?REVIEW OF SYSTEMS: ?Constitutional: No fevers, chills, sweats, or change in appetite   she notes fatigue, insomnia and daytime hypersomnia ?Eyes: No visual changes, double vision, eye pain ?Ear, nose and throat: No hearing loss, ear pain, nasal congestion, sore throat ?Cardiovascular: No chest pain, palpitations ?Respiratory:  No shortness of breath at rest or with exertion.   No wheezes.   She snores ?GastrointestinaI: No nausea, vomiting, diarrhea, abdominal pain, fecal incontinence.  Some constipation ?Genitourinary:  No dysuria, urinary retention.   She has frequency and nocturia. ?Musculoskeletal:  No neck pain, back pain ?Integumentary: No rash, pruritus, skin lesions ?Neurological: as above ?Psychiatric: No depression at this time.  No anxiety now (had in past) ?Endocrine: No palpitations, diaphoresis, change in appetite, change in weigh or increased thirst ?Hematologic/Lymphatic:  No anemia, purpura, petechiae. ?Allergic/Immunologic: No itchy/runny eyes, nasal congestion, recent allergic reactions, rashes ? ?ALLERGIES: ?No Known Allergies ? ?HOME MEDICATIONS: ? ?Current Outpatient Medications:  ?  allopurinol (ZYLOPRIM) 100 MG tablet, Take 100 mg by mouth daily., Disp: , Rfl:  ?  amLODIPine-Valsartan-HCTZ 10-320-25 MG TABS, Take 1 tablet by mouth daily., Disp: , Rfl:  ?  cetirizine (ZYRTEC) 10 MG tablet, Take 10 mg by mouth at bedtime. , Disp: , Rfl:  ?  Cladribine (MAVENCLAD, 10 TABS, PO), Take by mouth., Disp: , Rfl:  ?  cyclobenzaprine (FLEXERIL) 5 MG tablet, TAKE 1 TABLET BY MOUTH EVERY DAY AT BEDTIME,  Disp: 30 tablet, Rfl: 5 ?  fluticasone (FLONASE) 50 MCG/ACT nasal spray, Place 1 spray into both nostrils daily., Disp: , Rfl:  ?  ibuprofen (ADVIL,MOTRIN) 600 MG tablet, Take 1 tablet (600 mg total) by mouth every 6 (six) hours as needed., Disp: 30 tablet, Rfl: 0 ?  Lactobacillus (PROBIOTIC ACIDOPHILUS PO), Take 1 capsule by mouth at bedtime. 1 BILLION CFUs, Disp: , Rfl:  ?  meloxicam (MOBIC) 15 MG tablet, Take 15 mg by mouth daily., Disp: , Rfl:  ?  Multiple Vitamin (MULTI VITAMIN DAILY PO), Take by mouth., Disp: , Rfl:  ?  pravastatin (PRAVACHOL) 20 MG tablet, Take 20 mg by mouth at bedtime. , Disp: , Rfl: 3 ?  sertraline (ZOLOFT) 50 MG tablet, TAKE 1 TABLET (50 MG) BY MOUTH DAILY AT NIGHT., Disp: , Rfl: 3 ?  VITAMIN D PO, Take 5,000 Units by mouth daily., Disp: , Rfl:  ? ?PAST MEDICAL HISTORY: ?Past Medical History:  ?Diagnosis Date  ? Anxiety   ? Cancer ([place]) 01/2017  ? cervical cancer   ? Depression   ? Diabetes ([place])   ? Genital warts   ?  Hypercholesteremia   ? Hypertension   ? Multiple sclerosis ([place]) 01/2016  ? Multiple sclerosis ([place])   ? Numbness   ? occassional numbnes in in extremities  ? Vision abnormalities   ? ? ?PAST SURGICAL HISTORY:

## 2022-01-15 LAB — COMPREHENSIVE METABOLIC PANEL
ALT: 48 IU/L — ABNORMAL HIGH (ref 0–32)
AST: 34 IU/L (ref 0–40)
Albumin/Globulin Ratio: 1.3 (ref 1.2–2.2)
Albumin: 4.4 g/dL (ref 3.8–4.8)
Alkaline Phosphatase: 93 IU/L (ref 44–121)
BUN/Creatinine Ratio: 17 (ref 9–23)
BUN: 12 mg/dL (ref 6–24)
Bilirubin Total: 0.6 mg/dL (ref 0.0–1.2)
CO2: 19 mmol/L — ABNORMAL LOW (ref 20–29)
Calcium: 10 mg/dL (ref 8.7–10.2)
Chloride: 103 mmol/L (ref 96–106)
Creatinine, Ser: 0.7 mg/dL (ref 0.57–1.00)
Globulin, Total: 3.3 g/dL (ref 1.5–4.5)
Glucose: 120 mg/dL — ABNORMAL HIGH (ref 70–99)
Potassium: 3.8 mmol/L (ref 3.5–5.2)
Sodium: 138 mmol/L (ref 134–144)
Total Protein: 7.7 g/dL (ref 6.0–8.5)
eGFR: 105 mL/min/{1.73_m2} (ref >59)

## 2022-01-15 LAB — CBC WITH DIFFERENTIAL/PLATELET
Basophils Absolute: 0 10*3/uL (ref 0.0–0.2)
Basos: 0 %
EOS (ABSOLUTE): 0.3 10*3/uL (ref 0.0–0.4)
Eos: 4 %
Hematocrit: 40.8 % (ref 34.0–46.6)
Hemoglobin: 13.8 g/dL (ref 11.1–15.9)
Immature Grans (Abs): 0 10*3/uL (ref 0.0–0.1)
Immature Granulocytes: 0 %
Lymphocytes Absolute: 0.3 10*3/uL — ABNORMAL LOW (ref 0.7–3.1)
Lymphs: 6 %
MCH: 28.5 pg (ref 26.6–33.0)
MCHC: 33.8 g/dL (ref 31.5–35.7)
MCV: 84 fL (ref 79–97)
Monocytes Absolute: 0.5 10*3/uL (ref 0.1–0.9)
Monocytes: 8 %
Neutrophils Absolute: 4.9 10*3/uL (ref 1.4–7.0)
Neutrophils: 82 %
Platelets: 256 10*3/uL (ref 150–450)
RBC: 4.84 x10E6/uL (ref 3.77–5.28)
RDW: 15.8 % — ABNORMAL HIGH (ref 11.7–15.4)
WBC: 6 10*3/uL (ref 3.4–10.8)

## 2022-01-18 ENCOUNTER — Ambulatory Visit (INDEPENDENT_AMBULATORY_CARE_PROVIDER_SITE_OTHER): Payer: 59 | Admitting: Obstetrics and Gynecology

## 2022-01-18 ENCOUNTER — Other Ambulatory Visit (HOSPITAL_COMMUNITY)
Admission: RE | Admit: 2022-01-18 | Discharge: 2022-01-18 | Disposition: A | Discharge status: Home / Self Care | Payer: 59 | Source: Ambulatory Visit | Attending: Obstetrics and Gynecology | Admitting: Obstetrics and Gynecology

## 2022-01-18 ENCOUNTER — Encounter: Payer: Self-pay | Admitting: Obstetrics and Gynecology

## 2022-01-18 VITALS — BP 110/66 | HR 88 | Ht 63.0 in | Wt 223.0 lb

## 2022-01-18 DIAGNOSIS — Z01419 Encounter for gynecological examination (general) (routine) without abnormal findings: Secondary | ICD-10-CM

## 2022-01-18 DIAGNOSIS — Z1272 Encounter for screening for malignant neoplasm of vagina: Secondary | ICD-10-CM

## 2022-01-18 DIAGNOSIS — Z1211 Encounter for screening for malignant neoplasm of colon: Secondary | ICD-10-CM | POA: Diagnosis not present

## 2022-01-18 DIAGNOSIS — Z8541 Personal history of malignant neoplasm of cervix uteri: Secondary | ICD-10-CM | POA: Diagnosis present

## 2022-01-18 DIAGNOSIS — B3731 Acute candidiasis of vulva and vagina: Secondary | ICD-10-CM

## 2022-01-18 DIAGNOSIS — N898 Other specified noninflammatory disorders of vagina: Secondary | ICD-10-CM | POA: Diagnosis not present

## 2022-01-18 LAB — WET PREP FOR TRICH, YEAST, CLUE

## 2022-01-18 MED ORDER — FLUCONAZOLE 150 MG PO TABS: 150.0000 mg | ORAL_TABLET | Freq: Once | ORAL | 0 refills | Status: AC

## 2022-01-18 NOTE — Patient Instructions (Signed)

## 2022-01-20 LAB — CYTOLOGY - PAP
Comment: NEGATIVE
Diagnosis: UNDETERMINED — AB
High risk HPV: NEGATIVE

## 2022-02-16 ENCOUNTER — Ambulatory Visit: Payer: 59 | Admitting: Neurology

## 2022-05-19 ENCOUNTER — Ambulatory Visit: Payer: 59 | Admitting: Neurology

## 2022-05-19 ENCOUNTER — Encounter: Payer: Self-pay | Admitting: Neurology

## 2022-05-19 VITALS — BP 143/101 | HR 80 | Ht 63.0 in | Wt 224.5 lb

## 2022-05-19 DIAGNOSIS — R5383 Other fatigue: Secondary | ICD-10-CM

## 2022-05-19 DIAGNOSIS — R202 Paresthesia of skin: Secondary | ICD-10-CM | POA: Diagnosis not present

## 2022-05-19 DIAGNOSIS — Z79899 Other long term (current) drug therapy: Secondary | ICD-10-CM | POA: Diagnosis not present

## 2022-05-19 DIAGNOSIS — G35 Multiple sclerosis: Secondary | ICD-10-CM | POA: Diagnosis not present

## 2022-05-19 MED ORDER — CYCLOBENZAPRINE HCL 5 MG PO TABS: 5.0000 mg | ORAL_TABLET | Freq: Every day | ORAL | 4 refills | Status: DC

## 2022-05-19 NOTE — Progress Notes (Signed)
p  [facility]  PATIENT: Beverly Beltran DOB: 12-29-1970  REFERRING DOCTOR OR PCP:  Maurice Small SOURCE: Patient, ED and hospital notes in [place], labs and imaging reports in Epic, personal review of MRI images on PACS.  _________________________________   HISTORICAL  CHIEF COMPLAINT:  Chief Complaint  Patient presents with   New Patient (Initial Visit)    Rm 2, alone. Here for 4 month f/u for MS, on Mavenclad (completed). Pt reports doing well today. No new sx.    HISTORY OF PRESENT ILLNESS:  Beverly Beltran is a 51 y.o. woman with relapsing remitting MS.  Update 01/14/2022 She had her first year dose Mavenclad January 29-Feb 2 and early March 2022.   Second year was 11/11/2021-11/15/2021 and 12/09/2021-12/13/2021.    She had no side effects while taking the medication or afterwards.  She had CBC / DIff in 12/3021 (lymphocytes normal at 0.3). and  AST = 34 and ALT = 48 (slightly elevated).       She feels her MS is stable.  Specifically she has no exacerbations or new symptoms.    Hr gait is fine and she can walk a couple  miles at a moderate pace but can't keep up a fast pace. She keeps up with others but may lag slightly for longer distance - especially in the heat.   Balance is fine.    She does have mild reduced balance which has been present since the onset.  She uses the bannister on stairs . Occasional wobbles, no falls.    She has mild finger numbness but nothing too bothersome.    It is worse when she writes more.    Bladder function is the same with minimal stress incontinence - not bad enough to treat.  .   Vision is fine.     She has minimal fatigue.    Cognition and mood are fine.    She had cervical cancer stage FICO 1A1 and had a curative hysterectomy in 2018 thus she no longer has a cervix.    She had a colonoscopy  no polyps, some diverticulosis.   No skin lesions.    She had elevated HgbA1c and was able to lose 15 pounds with improvement.   She is told  she is pre-diabetic ad is not on a DM medication.     MS history: Around 01/28/2016, she had the onset of slurred speech and also felt more fatigue. Additionally, there was a dysesthetic numbness going into the left arm towards the thumb. She notes that she had just moved into a new house and had done quite a bit of activity over the preceding couple of weeks when symptoms did not improve after couple days, she went to the emergency room. There, she was admitted. The initial CT scan was normal but MRI of the brain was consistent with MS. MRI of the spine did not show any MS lesions but did show significant degenerative changes at C5-C6 as well as T7-T8, T8-T9 and T9-T10..   She received 5 days of steroids and was discharged home.  By the third or fourth day of IV steroids, symptoms were much better.   She was discharged after 5 days of steroids.  She entered the ALKS-8700 evolve study (comparison with Tecfidera for 5 weeks) and the open label extension for the next 2 years.  Due to insurance issues, Vumerity was not available to her and she switched to Tecfidera in 2019.  MRI of the brain in  2020 showed 1 breakthrough lesion.  MRI of the brain in December 2021 showed a second breakthrough lesion.  After discussion of options, she opted to switch to [facility] December 2021  Imaging review:  MRI of the brain 02/03/2016 without contrast and 02/04/2016 with contrast show an enhancing large focus in the left frontal lobe, is also hyperintense on diffusion-weighted images. There are several other T2/FLAIR hyperintense foci in the periventricular, deep and juxtacortical white matter.   The MRI of the cervical spine 02/03/2016 showed normal spinal cord and shows degenerative changes at C5-C6 causing severe right and moderate left foraminal narrowing that could lead to right C6 nerve root compression there are milder degenerative changes at C3-C4 and C4-C5 with less potential for nerve root impingement.    MRI of  the thoracic spine 02/03/2016 showed central disc extrusions at T7-T8, T8-T9 and T9-T10 deforming the spinal cord was in mild spinal stenosis of these levels. The spinal cord had normal signal.  MRI of the brain 04/22/2016, 06/09/2016, 05/11/2017 and 04/18/2018 with the drug study protocol showed no new lesions.  MRI of the brain 08/05/2019 shows multiple T2/FLAIR hyperintense foci in the periventricular, juxtacortical and deep white matter in a pattern and configuration consistent with chronic demyelinating plaque associated with multiple sclerosis.  None of these foci enhance or appear to be acute though one focus in the posterior left frontal lobe was not present on the MRI from 04/18/2018 and represents some disease activity.  MRI of the brain 09/20/2020 shows T2/FLAIR hyperintense foci in the cerebral hemispheres in a pattern and configuration consistent with chronic demyelinating plaque associated with multiple sclerosis. None of the foci were acute or enhance. However, one focus in the right frontal lobe seen on the current MRI was not present on the 08/05/2019 MRI and likely represents interval MS activity.  MRI of the brain 10/21/2021  and showed  Scattered T2/FLAIR hyperintense foci in the periventricular, juxtacortical and deep white matter of the hemispheres.  None of the foci appear to be acute.  They do not enhance.  Compared to the MRI from 09/18/2020, there are no new lesions.   No acute findings.  Normal enhancement pattern.   REVIEW OF SYSTEMS: Constitutional: No fevers, chills, sweats, or change in appetite   she notes fatigue, insomnia and daytime hypersomnia Eyes: No visual changes, double vision, eye pain Ear, nose and throat: No hearing loss, ear pain, nasal congestion, sore throat Cardiovascular: No chest pain, palpitations Respiratory:  No shortness of breath at rest or with exertion.   No wheezes.   She snores GastrointestinaI: No nausea, vomiting, diarrhea, abdominal pain, fecal  incontinence.  Some constipation Genitourinary:  No dysuria, urinary retention.  She has frequency and nocturia. Musculoskeletal:  No neck pain, back pain Integumentary: No rash, pruritus, skin lesions Neurological: as above Psychiatric: No depression at this time.  No anxiety now (had in past) Endocrine: No palpitations, diaphoresis, change in appetite, change in weigh or increased thirst Hematologic/Lymphatic:  No anemia, purpura, petechiae. Allergic/Immunologic: No itchy/runny eyes, nasal congestion, recent allergic reactions, rashes  ALLERGIES: No Known Allergies  HOME MEDICATIONS:  Current Outpatient Medications:    allopurinol (ZYLOPRIM) 100 MG tablet, Take 100 mg by mouth daily., Disp: , Rfl:    amLODIPine-Valsartan-HCTZ 10-320-25 MG TABS, Take 1 tablet by mouth daily., Disp: , Rfl:    cetirizine (ZYRTEC) 10 MG tablet, Take 10 mg by mouth at bedtime. , Disp: , Rfl:    Cladribine (MAVENCLAD, 10 TABS, PO), Take by mouth., Disp: ,  Rfl:    fluticasone (FLONASE) 50 MCG/ACT nasal spray, Place 1 spray into both nostrils daily., Disp: , Rfl:    ibuprofen (ADVIL,MOTRIN) 600 MG tablet, Take 1 tablet (600 mg total) by mouth every 6 (six) hours as needed., Disp: 30 tablet, Rfl: 0   Lactobacillus (PROBIOTIC ACIDOPHILUS PO), Take 1 capsule by mouth at bedtime. 1 BILLION CFUs, Disp: , Rfl:    meloxicam (MOBIC) 15 MG tablet, Take 15 mg by mouth daily., Disp: , Rfl:    Multiple Vitamin (MULTI VITAMIN DAILY PO), Take by mouth., Disp: , Rfl:    pravastatin (PRAVACHOL) 20 MG tablet, Take 20 mg by mouth at bedtime. , Disp: , Rfl: 3   sertraline (ZOLOFT) 50 MG tablet, TAKE 1 TABLET (50 MG) BY MOUTH DAILY AT NIGHT., Disp: , Rfl: 3   VITAMIN D PO, Take 5,000 Units by mouth daily., Disp: , Rfl:    cyclobenzaprine (FLEXERIL) 5 MG tablet, Take 1 tablet (5 mg total) by mouth at bedtime., Disp: 90 tablet, Rfl: 4  PAST MEDICAL HISTORY: Past Medical History:  Diagnosis Date   Anxiety    Cancer ([place])  01/2017   cervical cancer    Depression    Diabetes ([place])    Genital warts    Hypercholesteremia    Hypertension    Multiple sclerosis ([place]) 01/2016   Multiple sclerosis ([place])    Numbness    occassional numbnes in in extremities   Vision abnormalities     PAST SURGICAL HISTORY: Past Surgical History:  Procedure Laterality Date   ABDOMINAL HYSTERECTOMY     CERVICAL BIOPSY  W/ LOOP ELECTRODE EXCISION     COLPOSCOPY     DILATION AND CURETTAGE OF UTERUS  1994   ROBOTIC ASSISTED TOTAL HYSTERECTOMY WITH BILATERAL SALPINGO OOPHERECTOMY Bilateral 02/22/2017   Procedure: XI ROBOTIC ASSISTED TOTAL HYSTERECTOMY WITH BILATERAL SALPINGECTOMY;  Surgeon: Everitt Amber, MD;  Location: [facility];  Service: Gynecology;  Laterality: Bilateral;    FAMILY HISTORY: Family History  Problem Relation Age of Onset   Diabetes Mellitus II Mother    COPD Father     SOCIAL HISTORY:  Social History   Socioeconomic History   Marital status: Married    Spouse name: Not on file   Number of children: Not on file   Years of education: Not on file   Highest education level: Not on file  Occupational History   Not on file  Tobacco Use   Smoking status: Never   Smokeless tobacco: Never  Vaping Use   Vaping Use: Never used  Substance and Sexual Activity   Alcohol use: No   Drug use: No   Sexual activity: Yes    Partners: Male    Birth control/protection: Surgical    Comment: hysterectomy   Other Topics Concern   Not on file  Social History Narrative   Not on file   Social Determinants of Health   Financial Resource Strain: Not on file  Food Insecurity: Not on file  Transportation Needs: Not on file  Physical Activity: Not on file  Stress: Not on file  Social Connections: Not on file  Intimate Partner Violence: Not on file     PHYSICAL EXAM  Vitals:   05/19/22 1054  BP: (!) 143/101  Pulse: 80  Weight: 224 lb 8 oz (101.8 kg)  Height: '5\' 3"'$  (1.6 m)    Body mass index is 39.77  kg/m.  VISUAL Acuity:  20/30 OS  20/30 OD  General: The patient is well-developed  and well-nourished and in no acute distress   Neurologic Exam  Mental status: The patient is alert and oriented x 3 at the time of the examination. The patient has apparent normal recent and remote memory, with an apparently normal attention span and concentration ability.   Speech is normal.  Cranial nerves: Extraocular movements are full.   She has normal facial strength and sensation.  Trapezius strength is normal.. No obvious hearing deficits.  Motor:  Muscle bulk is normal.   Tone is normal. Strength is  5 / 5 in all 4 extremities.   Sensory: Sensory testing is intact to touch, temperature and vibration.   Coordination: Cerebellar testing reveals good finger-nose-finger and heel-to-shin bilaterally.  Gait and station: Station is normal.  The gait and tandem gait are normal.  Romberg is negative..   Reflexes: Deep tendon reflexes are symmetric and normal bilaterally.     EDSS equals 2(visual 2,  bladder and ambulation are 1)     DIAGNOSTIC DATA (LABS, IMAGING, TESTING) - I reviewed patient records, labs, notes, testing and imaging myself where available.  Lab Results  Component Value Date   WBC 6.0 01/14/2022   HGB 13.8 01/14/2022   HCT 40.8 01/14/2022   MCV 84 01/14/2022   PLT 256 01/14/2022      Component Value Date/Time   NA 138 01/14/2022 1202   K 3.8 01/14/2022 1202   CL 103 01/14/2022 1202   CO2 19 (L) 01/14/2022 1202   GLUCOSE 120 (H) 01/14/2022 1202   GLUCOSE 154 (H) 02/23/2017 0426   BUN 12 01/14/2022 1202   CREATININE 0.70 01/14/2022 1202   CALCIUM 10.0 01/14/2022 1202   PROT 7.7 01/14/2022 1202   ALBUMIN 4.4 01/14/2022 1202   AST 34 01/14/2022 1202   ALT 48 (H) 01/14/2022 1202   ALKPHOS 93 01/14/2022 1202   BILITOT 0.6 01/14/2022 1202   GFRNONAA >60 02/23/2017 0426   GFRAA >60 02/23/2017 0426   ___________________________________________________   Multiple  sclerosis ([place]) - Plan: CBC with Differential/Platelet, Comprehensive metabolic panel  High risk medication use - Plan: CBC with Differential/Platelet, Comprehensive metabolic panel  Paresthesia  Other fatigue  1.  She has tolerated Mavenclad well.  She has completed the second year of therapy and is 6 months into the second year.  We will check CBC with differential and liver function test. 2.   Stay active and exercise as tolerated.   3.   return in 6 months or sooner if there are new or worsening neurologic symptoms.     Kenna Seward A. Felecia Shelling, MD, PhD 02/16/8840, 6:60 PM Certified in Neurology, Clinical Neurophysiology, Sleep Medicine, Pain Medicine and Neuroimaging  [facility] [address], [place], [place] [address] [phone]

## 2022-05-20 LAB — COMPREHENSIVE METABOLIC PANEL
ALT: 43 IU/L — ABNORMAL HIGH (ref 0–32)
AST: 28 IU/L (ref 0–40)
Albumin/Globulin Ratio: 1.3 (ref 1.2–2.2)
Albumin: 4.2 g/dL (ref 3.9–4.9)
Alkaline Phosphatase: 91 IU/L (ref 44–121)
BUN/Creatinine Ratio: 11 (ref 9–23)
BUN: 8 mg/dL (ref 6–24)
Bilirubin Total: 0.5 mg/dL (ref 0.0–1.2)
CO2: 21 mmol/L (ref 20–29)
Calcium: 9.8 mg/dL (ref 8.7–10.2)
Chloride: 97 mmol/L (ref 96–106)
Creatinine, Ser: 0.72 mg/dL (ref 0.57–1.00)
Globulin, Total: 3.2 g/dL (ref 1.5–4.5)
Glucose: 120 mg/dL — ABNORMAL HIGH (ref 70–99)
Potassium: 3.7 mmol/L (ref 3.5–5.2)
Sodium: 137 mmol/L (ref 134–144)
Total Protein: 7.4 g/dL (ref 6.0–8.5)
eGFR: 102 mL/min/{1.73_m2} (ref >59)

## 2022-05-20 LAB — CBC WITH DIFFERENTIAL/PLATELET
Basophils Absolute: 0 10*3/uL (ref 0.0–0.2)
Basos: 0 %
EOS (ABSOLUTE): 0.2 10*3/uL (ref 0.0–0.4)
Eos: 3 %
Hematocrit: 41.6 % (ref 34.0–46.6)
Hemoglobin: 13.9 g/dL (ref 11.1–15.9)
Immature Grans (Abs): 0 10*3/uL (ref 0.0–0.1)
Immature Granulocytes: 1 %
Lymphocytes Absolute: 0.5 10*3/uL — ABNORMAL LOW (ref 0.7–3.1)
Lymphs: 8 %
MCH: 27 pg (ref 26.6–33.0)
MCHC: 33.4 g/dL (ref 31.5–35.7)
MCV: 81 fL (ref 79–97)
Monocytes Absolute: 0.4 10*3/uL (ref 0.1–0.9)
Monocytes: 6 %
Neutrophils Absolute: 4.7 10*3/uL (ref 1.4–7.0)
Neutrophils: 82 %
Platelets: 193 10*3/uL (ref 150–450)
RBC: 5.15 x10E6/uL (ref 3.77–5.28)
RDW: 14.3 % (ref 11.7–15.4)
WBC: 5.8 10*3/uL (ref 3.4–10.8)

## 2022-09-06 ENCOUNTER — Other Ambulatory Visit (HOSPITAL_BASED_OUTPATIENT_CLINIC_OR_DEPARTMENT_OTHER): Payer: Self-pay | Admitting: Obstetrics and Gynecology

## 2022-09-06 DIAGNOSIS — Z1231 Encounter for screening mammogram for malignant neoplasm of breast: Secondary | ICD-10-CM

## 2022-09-14 ENCOUNTER — Inpatient Hospital Stay (HOSPITAL_BASED_OUTPATIENT_CLINIC_OR_DEPARTMENT_OTHER): Admission: RE | Admit: 2022-09-14 | Payer: 59 | Source: Ambulatory Visit | Admitting: Radiology

## 2022-09-14 ENCOUNTER — Ambulatory Visit (HOSPITAL_BASED_OUTPATIENT_CLINIC_OR_DEPARTMENT_OTHER)
Admission: RE | Admit: 2022-09-14 | Discharge: 2022-09-14 | Disposition: A | Discharge status: Home / Self Care | Payer: 59 | Source: Ambulatory Visit | Attending: Obstetrics and Gynecology | Admitting: Obstetrics and Gynecology

## 2022-09-14 DIAGNOSIS — Z1231 Encounter for screening mammogram for malignant neoplasm of breast: Secondary | ICD-10-CM

## 2022-11-29 ENCOUNTER — Ambulatory Visit: Payer: 59 | Admitting: Neurology

## 2022-11-29 ENCOUNTER — Encounter: Payer: Self-pay | Admitting: Neurology

## 2022-11-29 VITALS — BP 143/99 | HR 85 | Ht 62.0 in | Wt 228.5 lb

## 2022-11-29 DIAGNOSIS — R5383 Other fatigue: Secondary | ICD-10-CM

## 2022-11-29 DIAGNOSIS — G35 Multiple sclerosis: Secondary | ICD-10-CM | POA: Diagnosis not present

## 2022-11-29 DIAGNOSIS — Z79899 Other long term (current) drug therapy: Secondary | ICD-10-CM

## 2022-11-29 DIAGNOSIS — R202 Paresthesia of skin: Secondary | ICD-10-CM | POA: Diagnosis not present

## 2022-11-29 NOTE — Progress Notes (Signed)
p  [facility]  PATIENT: Beverly Beltran DOB: [birth year]  REFERRING DOCTOR OR PCP:  Maurice Small SOURCE: Patient, ED and hospital notes in [place], labs and imaging reports in Epic, personal review of MRI images on PACS.  _________________________________   HISTORICAL  CHIEF COMPLAINT:  Chief Complaint  Patient presents with   Room 10    Pt is here Alone. Pt states that things have been going pretty good since last appointment. Pt states no muscle weakness. Pt states she doesn't have any tingling or burning sensation is her legs or feet.     HISTORY OF PRESENT ILLNESS:  Beverly Beltran is a 52 y.o. woman with relapsing remitting MS.  Update 01/14/2022 She had her first year dose Mavenclad January 29-Feb 2 and early March 2022.   Second year was 11/11/2021-11/15/2021 and 12/09/2021-12/13/2021.    She had no side effects while taking the medication or afterwards.  She had CBC / DIff in 8/2//3023 (lymphocytes normal at 0.5). and  AST = 28 (normal) and ALT = 43 (slightly elevated).       She feels her MS is stable.  Specifically she has no exacerbations or new symptoms.    Hr gait is unchanged.  She can walk a couple  miles at a moderate pace but can't keep up with others a fast pace. She could do a mile in 18-20 minutes.   She may lag slightly for longer distance - especially in the heat.   Balance is fine.     She uses the bannister on stairs . No falls.    She has mild finger numbness but nothing too bothersome.    It is worse when she writes more.    Bladder function is the same with minimal stress incontinence with cough - not bad enough to treat.  .   Vision is fine.     She has minimal fatigue.    Cognition and mood are fine.    She had cervical cancer stage FICO 1A1 and had a curative hysterectomy in 2018 thus she no longer has a cervix.    No skin lesions.    She had elevated HgbA1c and was able to lose 15 pounds with improvement.   She is told she is pre-diabetic  ad is not on a DM medication.    Vit D was low normal in 09/2021  MS history: Around 01/28/2016, she had the onset of slurred speech and also felt more fatigue. Additionally, there was a dysesthetic numbness going into the left arm towards the thumb. She notes that she had just moved into a new house and had done quite a bit of activity over the preceding couple of weeks when symptoms did not improve after couple days, she went to the emergency room. There, she was admitted. The initial CT scan was normal but MRI of the brain was consistent with MS. MRI of the spine did not show any MS lesions but did show significant degenerative changes at C5-C6 as well as T7-T8, T8-T9 and T9-T10..   She received 5 days of steroids and was discharged home.  By the third or fourth day of IV steroids, symptoms were much better.   She was discharged after 5 days of steroids.  She entered the ALKS-8700 evolve study (comparison with Tecfidera for 5 weeks) and the open label extension for the next 2 years.  Due to insurance issues, Vumerity was not available to her and she switched to Tecfidera in 2019.  MRI of the brain in 2020 showed 1 breakthrough lesion.  MRI of the brain in December 2021 showed a second breakthrough lesion.  After discussion of options, she opted to switch to [facility] December 2021  Imaging review:  MRI of the brain 02/03/2016 without contrast and 02/04/2016 with contrast show an enhancing large focus in the left frontal lobe, is also hyperintense on diffusion-weighted images. There are several other T2/FLAIR hyperintense foci in the periventricular, deep and juxtacortical white matter.   The MRI of the cervical spine 02/03/2016 showed normal spinal cord and shows degenerative changes at C5-C6 causing severe right and moderate left foraminal narrowing that could lead to right C6 nerve root compression there are milder degenerative changes at C3-C4 and C4-C5 with less potential for nerve root impingement.     MRI of the thoracic spine 02/03/2016 showed central disc extrusions at T7-T8, T8-T9 and T9-T10 deforming the spinal cord was in mild spinal stenosis of these levels. The spinal cord had normal signal.  MRI of the brain 04/22/2016, 06/09/2016, 05/11/2017 and 04/18/2018 with the drug study protocol showed no new lesions.  MRI of the brain 08/05/2019 shows multiple T2/FLAIR hyperintense foci in the periventricular, juxtacortical and deep white matter in a pattern and configuration consistent with chronic demyelinating plaque associated with multiple sclerosis.  None of these foci enhance or appear to be acute though one focus in the posterior left frontal lobe was not present on the MRI from 04/18/2018 and represents some disease activity.  MRI of the brain 09/20/2020 shows T2/FLAIR hyperintense foci in the cerebral hemispheres in a pattern and configuration consistent with chronic demyelinating plaque associated with multiple sclerosis. None of the foci were acute or enhance. However, one focus in the right frontal lobe seen on the current MRI was not present on the 08/05/2019 MRI and likely represents interval MS activity.  MRI of the brain 10/21/2021  and showed  Scattered T2/FLAIR hyperintense foci in the periventricular, juxtacortical and deep white matter of the hemispheres.  None of the foci appear to be acute.  They do not enhance.  Compared to the MRI from 09/18/2020, there are no new lesions.   No acute findings.  Normal enhancement pattern.   REVIEW OF SYSTEMS: Constitutional: No fevers, chills, sweats, or change in appetite   she notes fatigue, insomnia and daytime hypersomnia Eyes: No visual changes, double vision, eye pain Ear, nose and throat: No hearing loss, ear pain, nasal congestion, sore throat Cardiovascular: No chest pain, palpitations Respiratory:  No shortness of breath at rest or with exertion.   No wheezes.   She snores GastrointestinaI: No nausea, vomiting, diarrhea, abdominal pain,  fecal incontinence.  Some constipation Genitourinary:  No dysuria, urinary retention.  She has frequency and nocturia. Musculoskeletal:  No neck pain, back pain Integumentary: No rash, pruritus, skin lesions Neurological: as above Psychiatric: No depression at this time.  No anxiety now (had in past) Endocrine: No palpitations, diaphoresis, change in appetite, change in weigh or increased thirst Hematologic/Lymphatic:  No anemia, purpura, petechiae. Allergic/Immunologic: No itchy/runny eyes, nasal congestion, recent allergic reactions, rashes  ALLERGIES: No Known Allergies  HOME MEDICATIONS:  Current Outpatient Medications:    allopurinol (ZYLOPRIM) 100 MG tablet, Take 100 mg by mouth daily., Disp: , Rfl:    amLODIPine-Valsartan-HCTZ 10-320-25 MG TABS, Take 1 tablet by mouth daily., Disp: , Rfl:    cetirizine (ZYRTEC) 10 MG tablet, Take 10 mg by mouth at bedtime. , Disp: , Rfl:    cyclobenzaprine (FLEXERIL) 5 MG  tablet, Take 1 tablet (5 mg total) by mouth at bedtime., Disp: 90 tablet, Rfl: 4   fluticasone (FLONASE) 50 MCG/ACT nasal spray, Place 1 spray into both nostrils daily., Disp: , Rfl:    ibuprofen (ADVIL,MOTRIN) 600 MG tablet, Take 1 tablet (600 mg total) by mouth every 6 (six) hours as needed., Disp: 30 tablet, Rfl: 0   meloxicam (MOBIC) 15 MG tablet, Take 15 mg by mouth daily., Disp: , Rfl:    Multiple Vitamin (MULTI VITAMIN DAILY PO), Take by mouth., Disp: , Rfl:    pravastatin (PRAVACHOL) 20 MG tablet, Take 20 mg by mouth at bedtime. , Disp: , Rfl: 3   sertraline (ZOLOFT) 50 MG tablet, TAKE 1 TABLET (50 MG) BY MOUTH DAILY AT NIGHT., Disp: , Rfl: 3   Cladribine (MAVENCLAD, 10 TABS, PO), Take by mouth., Disp: , Rfl:    Lactobacillus (PROBIOTIC ACIDOPHILUS PO), Take 1 capsule by mouth at bedtime. 1 BILLION CFUs, Disp: , Rfl:    VITAMIN D PO, Take 5,000 Units by mouth daily., Disp: , Rfl:   PAST MEDICAL HISTORY: Past Medical History:  Diagnosis Date   Anxiety    Cancer ([place])  01/2017   cervical cancer    Depression    Diabetes ([place])    Genital warts    Hypercholesteremia    Hypertension    Multiple sclerosis ([place]) 01/2016   Multiple sclerosis ([place])    Numbness    occassional numbnes in in extremities   Vision abnormalities     PAST SURGICAL HISTORY: Past Surgical History:  Procedure Laterality Date   ABDOMINAL HYSTERECTOMY     CERVICAL BIOPSY  W/ LOOP ELECTRODE EXCISION     COLPOSCOPY     DILATION AND CURETTAGE OF UTERUS  1994   ROBOTIC ASSISTED TOTAL HYSTERECTOMY WITH BILATERAL SALPINGO OOPHERECTOMY Bilateral 02/22/2017   Procedure: XI ROBOTIC ASSISTED TOTAL HYSTERECTOMY WITH BILATERAL SALPINGECTOMY;  Surgeon: Everitt Amber, MD;  Location: [facility];  Service: Gynecology;  Laterality: Bilateral;    FAMILY HISTORY: Family History  Problem Relation Age of Onset   Diabetes Mellitus II Mother    COPD Father     SOCIAL HISTORY:  Social History   Socioeconomic History   Marital status: Married    Spouse name: Not on file   Number of children: Not on file   Years of education: Not on file   Highest education level: Not on file  Occupational History   Not on file  Tobacco Use   Smoking status: Never   Smokeless tobacco: Never  Vaping Use   Vaping Use: Never used  Substance and Sexual Activity   Alcohol use: No   Drug use: No   Sexual activity: Yes    Partners: Male    Birth control/protection: Surgical    Comment: hysterectomy   Other Topics Concern   Not on file  Social History Narrative   Not on file   Social Determinants of Health   Financial Resource Strain: Not on file  Food Insecurity: Not on file  Transportation Needs: Not on file  Physical Activity: Not on file  Stress: Not on file  Social Connections: Not on file  Intimate Partner Violence: Not on file     PHYSICAL EXAM  Vitals:   11/29/22 1057  BP: (!) 143/99  Pulse: 85  Weight: 228 lb 8 oz (103.6 kg)  Height: 5' 2"$  (1.575 m)    Body mass index is 41.79  kg/m.  VISUAL Acuity:  20/20 OS  20/25 OD  General: The patient is well-developed and well-nourished and in no acute distress   Neurologic Exam  Mental status: The patient is alert and oriented x 3 at the time of the examination. The patient has apparent normal recent and remote memory, with an apparently normal attention span and concentration ability.   Speech is normal.  Cranial nerves: Extraocular movements are full.   She has normal facial strength and sensation.  Trapezius strength is normal.. No obvious hearing deficits.  Motor:  Muscle bulk is normal.   Tone is normal. Strength is  5 / 5 in all 4 extremities.   Sensory: Sensory testing is intact to touch, temperature and vibration.   Coordination: Cerebellar testing reveals good finger-nose-finger and heel-to-shin bilaterally.  Gait and station: Station is normal.  The gait is normal.Tandem gait is mildly wide.   Romberg is negative..   Reflexes: Deep tendon reflexes are symmetric and normal bilaterally.     EDSS equals 1.5  (visual 1,  bladder  1, cerebellar 1and ambulation are 1)     DIAGNOSTIC DATA (LABS, IMAGING, TESTING) - I reviewed patient records, labs, notes, testing and imaging myself where available.  Lab Results  Component Value Date   WBC 5.8 05/19/2022   HGB 13.9 05/19/2022   HCT 41.6 05/19/2022   MCV 81 05/19/2022   PLT 193 05/19/2022      Component Value Date/Time   NA 137 05/19/2022 1156   K 3.7 05/19/2022 1156   CL 97 05/19/2022 1156   CO2 21 05/19/2022 1156   GLUCOSE 120 (H) 05/19/2022 1156   GLUCOSE 154 (H) 02/23/2017 0426   BUN 8 05/19/2022 1156   CREATININE 0.72 05/19/2022 1156   CALCIUM 9.8 05/19/2022 1156   PROT 7.4 05/19/2022 1156   ALBUMIN 4.2 05/19/2022 1156   AST 28 05/19/2022 1156   ALT 43 (H) 05/19/2022 1156   ALKPHOS 91 05/19/2022 1156   BILITOT 0.5 05/19/2022 1156   GFRNONAA >60 02/23/2017 0426   GFRAA >60 02/23/2017 0426    ___________________________________________________   Multiple sclerosis (HCC)  High risk medication use  Paresthesia  Other fatigue  1.  She has tolerated Mavenclad well.  She completed the second year of therapy in early 2023 and is 12 months into the second year.  We will check CBC with differential and liver function test.  Lymphocytes were 0.5 in August 2023.    Also check MRI brain to determine if any subclinical progression.  If this is occurring, we will need to consider beginning and other disease modifying therapy 2.   Stay active and exercise as tolerated.   3.   Advised to continue vitamin D supplements on top of the regular multivitamin  return in 12 months or sooner if there are new or worsening neurologic symptoms.  Will check annual MRI for several  years     Yolanda Dockendorf A. Felecia Shelling, MD, PhD [id number], [id number] AM Certified in Neurology, Clinical Neurophysiology, Sleep Medicine, Pain Medicine and Neuroimaging  [facility] [address] [place], [place] [address] [phone]

## 2022-11-30 ENCOUNTER — Other Ambulatory Visit: Payer: Self-pay | Admitting: Neurology

## 2022-11-30 DIAGNOSIS — G35 Multiple sclerosis: Secondary | ICD-10-CM

## 2022-11-30 LAB — COMPREHENSIVE METABOLIC PANEL
ALT: 43 IU/L — ABNORMAL HIGH (ref 0–32)
AST: 37 IU/L (ref 0–40)
Albumin/Globulin Ratio: 1.4 (ref 1.2–2.2)
Albumin: 4.3 g/dL (ref 3.8–4.9)
Alkaline Phosphatase: 86 IU/L (ref 44–121)
BUN/Creatinine Ratio: 16 (ref 9–23)
BUN: 10 mg/dL (ref 6–24)
Bilirubin Total: 0.3 mg/dL (ref 0.0–1.2)
CO2: 21 mmol/L (ref 20–29)
Calcium: 9.7 mg/dL (ref 8.7–10.2)
Chloride: 102 mmol/L (ref 96–106)
Creatinine, Ser: 0.64 mg/dL (ref 0.57–1.00)
Globulin, Total: 3.1 g/dL (ref 1.5–4.5)
Glucose: 122 mg/dL — ABNORMAL HIGH (ref 70–99)
Potassium: 4.2 mmol/L (ref 3.5–5.2)
Sodium: 140 mmol/L (ref 134–144)
Total Protein: 7.4 g/dL (ref 6.0–8.5)
eGFR: 107 mL/min/{1.73_m2} (ref >59)

## 2022-11-30 LAB — CBC WITH DIFFERENTIAL/PLATELET
Basophils Absolute: 0 10*3/uL (ref 0.0–0.2)
Basos: 1 %
EOS (ABSOLUTE): 0.2 10*3/uL (ref 0.0–0.4)
Eos: 4 %
Hematocrit: 39.8 % (ref 34.0–46.6)
Hemoglobin: 13.3 g/dL (ref 11.1–15.9)
Immature Grans (Abs): 0 10*3/uL (ref 0.0–0.1)
Immature Granulocytes: 0 %
Lymphocytes Absolute: 0.7 10*3/uL (ref 0.7–3.1)
Lymphs: 14 %
MCH: 27.8 pg (ref 26.6–33.0)
MCHC: 33.4 g/dL (ref 31.5–35.7)
MCV: 83 fL (ref 79–97)
Monocytes Absolute: 0.3 10*3/uL (ref 0.1–0.9)
Monocytes: 6 %
Neutrophils Absolute: 3.8 10*3/uL (ref 1.4–7.0)
Neutrophils: 75 %
Platelets: 217 10*3/uL (ref 150–450)
RBC: 4.78 x10E6/uL (ref 3.77–5.28)
RDW: 14.8 % (ref 11.7–15.4)
WBC: 5 10*3/uL (ref 3.4–10.8)

## 2022-12-02 ENCOUNTER — Telehealth: Payer: Self-pay | Admitting: Neurology

## 2022-12-02 NOTE — Telephone Encounter (Signed)
Pt scheduled for MR brain w/wo contrast at [facility] for 12/07/22 at 11:30am  [facility] case# [id number]

## 2022-12-07 ENCOUNTER — Ambulatory Visit: Payer: 59

## 2022-12-07 DIAGNOSIS — G35 Multiple sclerosis: Secondary | ICD-10-CM

## 2022-12-07 MED ORDER — GADOBENATE DIMEGLUMINE 529 MG/ML IV SOLN
20.0000 mL | Freq: Once | INTRAVENOUS | Status: AC | PRN
  Administered 2022-12-07: 20 mL via INTRAVENOUS

## 2023-06-04 ENCOUNTER — Other Ambulatory Visit: Payer: Self-pay | Admitting: Neurology

## 2023-06-07 NOTE — Telephone Encounter (Signed)
Rx refilled.

## 2023-10-04 ENCOUNTER — Other Ambulatory Visit: Payer: Self-pay | Admitting: Family Medicine

## 2023-10-04 DIAGNOSIS — E2839 Other primary ovarian failure: Secondary | ICD-10-CM

## 2023-10-04 DIAGNOSIS — R748 Abnormal levels of other serum enzymes: Secondary | ICD-10-CM

## 2023-10-06 ENCOUNTER — Ambulatory Visit
Admission: RE | Admit: 2023-10-06 | Discharge: 2023-10-06 | Disposition: A | Discharge status: Home / Self Care | Payer: 59 | Source: Ambulatory Visit | Attending: Family Medicine | Admitting: Family Medicine

## 2023-10-06 DIAGNOSIS — R748 Abnormal levels of other serum enzymes: Secondary | ICD-10-CM

## 2023-11-24 NOTE — Progress Notes (Signed)
 p  [facility]  PATIENT: Beverly Beltran DOB: [birth year]  REFERRING DOCTOR OR PCP:  Niels Roys SOURCE: Patient, ED and hospital notes in Epic, labs and imaging reports in Epic, personal review of MRI images on PACS.  _________________________________   HISTORICAL  CHIEF COMPLAINT:  Chief Complaint  Patient presents with   Follow-up    Pt in 10, here alone  Pt is here for yearly follow up on MS. Pt states she has been doing well, has no concerns.     HISTORY OF PRESENT ILLNESS:  Beverly Beltran is a 53 y.o. woman with relapsing remitting MS.  Update 11/25/2023:  She had her first year dose Mavenclad January 29-Feb 2 and early March 2022.   Second year was 11/11/2021-11/15/2021 and 12/09/2021-12/13/2021.    She had no side effects while taking the medication or afterwards.  She had CBC / DIff in 8/2//3023 (lymphocytes normal at 0.5). and  AST = 28 (normal) and ALT = 43 (slightly elevated).   Lymphocyte counts were up to 0.7 on 11/29/2022.  She feels her MS is stable.  Specifically she has no exacerbations or new symptoms.    Her gait is unchanged.   Occasional stumbles but no falls.     She can walk a couple  miles at a moderate pace but usually does not keep up with others (they also have longer legs)   She uses the bannister on stairs .  Strength is fine.  No spasticity. No major sensory issues   Bladder function is the same with minimal stress incontinence with cough/sneeze - not bad enough to treat.  .   Vision is fine.   She now has bifocals  She has minimal fatigue.    Cognition and mood are fine.    She had cervical cancer stage FICO 1A1 and had a curative hysterectomy in 2018 thus she no longer has a cervix.    No skin lesions.    She had elevated HgbA1c and was able to lose 15 pounds with improvement.   She is trying to lose more weight to avoid starting aa DM med  Vit D was low normal in 09/2021  MS history: Around 01/28/2016, she had the onset of  slurred speech and also felt more fatigue. Additionally, there was a dysesthetic numbness going into the left arm towards the thumb. She notes that she had just moved into a new house and had done quite a bit of activity over the preceding couple of weeks when symptoms did not improve after couple days, she went to the emergency room. There, she was admitted. The initial CT scan was normal but MRI of the brain was consistent with MS. MRI of the spine did not show any MS lesions but did show significant degenerative changes at C5-C6 as well as T7-T8, T8-T9 and T9-T10..   She received 5 days of steroids and was discharged home.  By the third or fourth day of IV steroids, symptoms were much better.   She was discharged after 5 days of steroids.  She entered the ALKS-8700 evolve study (comparison with Tecfidera  for 5 weeks) and the open label extension for the next 2 years.  Due to insurance issues, Vumerity was not available to her and she switched to Tecfidera  in 2019.  MRI of the brain in 2020 showed 1 breakthrough lesion.  MRI of the brain in December 2021 showed a second breakthrough lesion.  After discussion of options, she opted to switch to Mavenclad  December 2021  Imaging review:  MRI of the brain 02/03/2016 without contrast and 02/04/2016 with contrast show an enhancing large focus in the left frontal lobe, is also hyperintense on diffusion-weighted images. There are several other T2/FLAIR hyperintense foci in the periventricular, deep and juxtacortical white matter.   The MRI of the cervical spine 02/03/2016 showed normal spinal cord and shows degenerative changes at C5-C6 causing severe right and moderate left foraminal narrowing that could lead to right C6 nerve root compression there are milder degenerative changes at C3-C4 and C4-C5 with less potential for nerve root impingement.    MRI of the thoracic spine 02/03/2016 showed central disc extrusions at T7-T8, T8-T9 and T9-T10 deforming the spinal  cord was in mild spinal stenosis of these levels. The spinal cord had normal signal.  MRI of the brain 04/22/2016, 06/09/2016, 05/11/2017 and 04/18/2018 with the drug study protocol showed no new lesions.  MRI of the brain 08/05/2019 shows multiple T2/FLAIR hyperintense foci in the periventricular, juxtacortical and deep white matter in a pattern and configuration consistent with chronic demyelinating plaque associated with multiple sclerosis.  None of these foci enhance or appear to be acute though one focus in the posterior left frontal lobe was not present on the MRI from 04/18/2018 and represents some disease activity.  MRI of the brain 09/20/2020 shows T2/FLAIR hyperintense foci in the cerebral hemispheres in a pattern and configuration consistent with chronic demyelinating plaque associated with multiple sclerosis. None of the foci were acute or enhance. However, one focus in the right frontal lobe seen on the current MRI was not present on the 08/05/2019 MRI and likely represents interval MS activity.  MRI of the brain 10/21/2021  and showed  Scattered T2/FLAIR hyperintense foci in the periventricular, juxtacortical and deep white matter of the hemispheres.  None of the foci appear to be acute.  They do not enhance.  Compared to the MRI from 09/18/2020, there are no new lesions.   No acute findings.  Normal enhancement pattern.  MRI of the brain 12/07/2022 was unchanged compared to the MRI from 10/21/2021.   REVIEW OF SYSTEMS: Constitutional: No fevers, chills, sweats, or change in appetite   she notes fatigue, insomnia and daytime hypersomnia Eyes: No visual changes, double vision, eye pain Ear, nose and throat: No hearing loss, ear pain, nasal congestion, sore throat Cardiovascular: No chest pain, palpitations Respiratory:  No shortness of breath at rest or with exertion.   No wheezes.   She snores GastrointestinaI: No nausea, vomiting, diarrhea, abdominal pain, fecal incontinence.  Some  constipation Genitourinary:  No dysuria, urinary retention.  She has frequency and nocturia. Musculoskeletal:  No neck pain, back pain Integumentary: No rash, pruritus, skin lesions Neurological: as above Psychiatric: No depression at this time.  No anxiety now (had in past) Endocrine: No palpitations, diaphoresis, change in appetite, change in weigh or increased thirst Hematologic/Lymphatic:  No anemia, purpura, petechiae. Allergic/Immunologic: No itchy/runny eyes, nasal congestion, recent allergic reactions, rashes  ALLERGIES: No Known Allergies  HOME MEDICATIONS:  Current Outpatient Medications:    Wheat Dextrin (BENEFIBER DRINK MIX PO), , Disp: , Rfl:    allopurinol  (ZYLOPRIM ) 100 MG tablet, Take 100 mg by mouth daily., Disp: , Rfl:    amLODIPine-Valsartan -HCTZ 10-320-25 MG TABS, Take 1 tablet by mouth daily., Disp: , Rfl:    cetirizine (ZYRTEC) 10 MG tablet, Take 10 mg by mouth at bedtime. , Disp: , Rfl:    cyclobenzaprine  (FLEXERIL ) 5 MG tablet, TAKE 1 TABLET BY MOUTH EVERYDAY  AT BEDTIME, Disp: 90 tablet, Rfl: 4   fluticasone (FLONASE) 50 MCG/ACT nasal spray, Place 1 spray into both nostrils daily., Disp: , Rfl:    ibuprofen  (ADVIL ,MOTRIN ) 600 MG tablet, Take 1 tablet (600 mg total) by mouth every 6 (six) hours as needed., Disp: 30 tablet, Rfl: 0   meloxicam  (MOBIC ) 15 MG tablet, Take 15 mg by mouth daily., Disp: , Rfl:    Multiple Vitamin (MULTI VITAMIN DAILY PO), Take by mouth., Disp: , Rfl:    pravastatin  (PRAVACHOL ) 20 MG tablet, Take 20 mg by mouth at bedtime. , Disp: , Rfl: 3   sertraline  (ZOLOFT ) 50 MG tablet, TAKE 1 TABLET (50 MG) BY MOUTH DAILY AT NIGHT., Disp: , Rfl: 3  PAST MEDICAL HISTORY: Past Medical History:  Diagnosis Date   Anxiety    Cancer (HCC) 01/2017   cervical cancer    Depression    Diabetes (HCC)    Genital warts    Hypercholesteremia    Hypertension    Multiple sclerosis (HCC) 01/2016   Multiple sclerosis (HCC)    Numbness    occassional  numbnes in in extremities   Vision abnormalities     PAST SURGICAL HISTORY: Past Surgical History:  Procedure Laterality Date   ABDOMINAL HYSTERECTOMY     CERVICAL BIOPSY  W/ LOOP ELECTRODE EXCISION     COLPOSCOPY     DILATION AND CURETTAGE OF UTERUS  1994   ROBOTIC ASSISTED TOTAL HYSTERECTOMY WITH BILATERAL SALPINGO OOPHERECTOMY Bilateral 02/22/2017   Procedure: XI ROBOTIC ASSISTED TOTAL HYSTERECTOMY WITH BILATERAL SALPINGECTOMY;  Surgeon: Eloy Herring, MD;  Location: [facility];  Service: Gynecology;  Laterality: Bilateral;    FAMILY HISTORY: Family History  Problem Relation Age of Onset   Diabetes Mellitus II Mother    COPD Father     SOCIAL HISTORY:  Social History   Socioeconomic History   Marital status: Married    Spouse name: Not on file   Number of children: Not on file   Years of education: Not on file   Highest education level: Not on file  Occupational History   Not on file  Tobacco Use   Smoking status: Never   Smokeless tobacco: Never  Vaping Use   Vaping status: Never Used  Substance and Sexual Activity   Alcohol use: No   Drug use: No   Sexual activity: Yes    Partners: Male    Birth control/protection: Surgical    Comment: hysterectomy   Other Topics Concern   Not on file  Social History Narrative   Not on file   Social Drivers of Health   Financial Resource Strain: Not on file  Food Insecurity: Not on file  Transportation Needs: Not on file  Physical Activity: Not on file  Stress: Not on file  Social Connections: Not on file  Intimate Partner Violence: Not on file     PHYSICAL EXAM  Vitals:   11/25/23 0958  BP: 138/82  Pulse: 72  Weight: 226 lb (102.5 kg)  Height: 5' 3 (1.6 m)     Body mass index is 40.03 kg/m.  VISUAL Acuity:  20/20 OS  20/25 OD  General: The patient is well-developed and well-nourished and in no acute distress   Neurologic Exam  Mental status: The patient is alert and oriented x 3 at the time of the  examination. The patient has apparent normal recent and remote memory, with an apparently normal attention span and concentration ability.   Speech is normal.  Cranial nerves:  Extraocular movements are full.   She has normal facial strength and sensation.  Trapezius strength is normal.. No obvious hearing deficits.  Motor:  Muscle bulk is normal.   Tone is normal. Strength is  5 / 5 in all 4 extremities.   Sensory: Sensory testing is intact to touch, temperature and vibration.   Coordination: Cerebellar testing reveals good finger-nose-finger and heel-to-shin bilaterally.  Gait and station: Station is normal.  The gait is normal.  Tandem gait is mildly wide.  .   Romberg is negative..   Reflexes: Deep tendon reflexes are symmetric and normal bilaterally.          DIAGNOSTIC DATA (LABS, IMAGING, TESTING) - I reviewed patient records, labs, notes, testing and imaging myself where available.  Lab Results  Component Value Date   WBC 5.0 11/29/2022   HGB 13.3 11/29/2022   HCT 39.8 11/29/2022   MCV 83 11/29/2022   PLT 217 11/29/2022      Component Value Date/Time   NA 140 11/29/2022 1147   K 4.2 11/29/2022 1147   CL 102 11/29/2022 1147   CO2 21 11/29/2022 1147   GLUCOSE 122 (H) 11/29/2022 1147   GLUCOSE 154 (H) 02/23/2017 0426   BUN 10 11/29/2022 1147   CREATININE 0.64 11/29/2022 1147   CALCIUM 9.7 11/29/2022 1147   PROT 7.4 11/29/2022 1147   ALBUMIN 4.3 11/29/2022 1147   AST 37 11/29/2022 1147   ALT 43 (H) 11/29/2022 1147   ALKPHOS 86 11/29/2022 1147   BILITOT 0.3 11/29/2022 1147   GFRNONAA >60 02/23/2017 0426   GFRAA >60 02/23/2017 0426   ___________________________________________________   Multiple sclerosis (HCC) - Plan: CBC with Differential/Platelet, MR BRAIN W WO CONTRAST  High risk medication use - Plan: CBC with Differential/Platelet, MR BRAIN W WO CONTRAST  Low serum vitamin D  - Plan: VITAMIN D  25 Hydroxy (Vit-D Deficiency,  Fractures)  Paresthesia  Other fatigue  1.  She tolerated Mavenclad well and completed the second year of therapy in early 2023 We will check CBC with differential and liver function test.   Also check MRI brain to determine if any subclinical progression.  If this is occurring, we will need to consider beginning and other disease modifying therapy 2.   Stay active and exercise as tolerated.   3.   Advised to continue vitamin D  supplements on top of the regular   Check level today  return in 12 months or sooner if there are new or worsening neurologic symptoms.  Will check annual MRI for several  years     Kyllie Pettijohn A. Vear, MD, PhD 11/25/2023, 10:24 AM Certified in Neurology, Clinical Neurophysiology, Sleep Medicine, Pain Medicine and Neuroimaging  [facility] [address] [place], [place] [address] [phone]

## 2023-11-25 ENCOUNTER — Encounter: Payer: Self-pay | Admitting: Neurology

## 2023-11-25 ENCOUNTER — Ambulatory Visit (INDEPENDENT_AMBULATORY_CARE_PROVIDER_SITE_OTHER): Payer: 59 | Admitting: Neurology

## 2023-11-25 VITALS — BP 138/82 | HR 72 | Ht 63.0 in | Wt 226.0 lb

## 2023-11-25 DIAGNOSIS — R7989 Other specified abnormal findings of blood chemistry: Secondary | ICD-10-CM | POA: Diagnosis not present

## 2023-11-25 DIAGNOSIS — Z79899 Other long term (current) drug therapy: Secondary | ICD-10-CM | POA: Diagnosis not present

## 2023-11-25 DIAGNOSIS — G35 Multiple sclerosis: Secondary | ICD-10-CM

## 2023-11-25 DIAGNOSIS — R5383 Other fatigue: Secondary | ICD-10-CM

## 2023-11-25 DIAGNOSIS — R202 Paresthesia of skin: Secondary | ICD-10-CM | POA: Diagnosis not present

## 2023-11-26 LAB — VITAMIN D 25 HYDROXY (VIT D DEFICIENCY, FRACTURES): Vit D, 25-Hydroxy: 36.8 ng/mL (ref 30.0–100.0)

## 2023-11-26 LAB — CBC WITH DIFFERENTIAL/PLATELET
Basophils Absolute: 0 10*3/uL (ref 0.0–0.2)
Basos: 0 %
EOS (ABSOLUTE): 0.2 10*3/uL (ref 0.0–0.4)
Eos: 3 %
Hematocrit: 42.1 % (ref 34.0–46.6)
Hemoglobin: 13.7 g/dL (ref 11.1–15.9)
Immature Grans (Abs): 0 10*3/uL (ref 0.0–0.1)
Immature Granulocytes: 0 %
Lymphocytes Absolute: 1 10*3/uL (ref 0.7–3.1)
Lymphs: 23 %
MCH: 27.3 pg (ref 26.6–33.0)
MCHC: 32.5 g/dL (ref 31.5–35.7)
MCV: 84 fL (ref 79–97)
Monocytes Absolute: 0.3 10*3/uL (ref 0.1–0.9)
Monocytes: 7 %
Neutrophils Absolute: 3 10*3/uL (ref 1.4–7.0)
Neutrophils: 67 %
Platelets: 199 10*3/uL (ref 150–450)
RBC: 5.02 x10E6/uL (ref 3.77–5.28)
RDW: 14.4 % (ref 11.7–15.4)
WBC: 4.5 10*3/uL (ref 3.4–10.8)

## 2023-11-27 ENCOUNTER — Encounter: Payer: Self-pay | Admitting: Neurology

## 2023-11-28 ENCOUNTER — Ambulatory Visit: Payer: 59 | Admitting: Neurology

## 2023-11-28 LAB — VITAMIN D 25 HYDROXY (VIT D DEFICIENCY, FRACTURES)

## 2023-11-28 LAB — CBC WITH DIFFERENTIAL/PLATELET

## 2023-12-06 ENCOUNTER — Ambulatory Visit: Payer: 59

## 2023-12-06 DIAGNOSIS — G35 Multiple sclerosis: Secondary | ICD-10-CM

## 2023-12-06 DIAGNOSIS — Z79899 Other long term (current) drug therapy: Secondary | ICD-10-CM | POA: Diagnosis not present

## 2023-12-06 MED ORDER — GADOBENATE DIMEGLUMINE 529 MG/ML IV SOLN
20.0000 mL | Freq: Once | INTRAVENOUS | Status: AC | PRN
  Administered 2023-12-06: 20 mL via INTRAVENOUS

## 2023-12-07 ENCOUNTER — Encounter: Payer: Self-pay | Admitting: Neurology

## 2023-12-13 ENCOUNTER — Encounter (HOSPITAL_BASED_OUTPATIENT_CLINIC_OR_DEPARTMENT_OTHER): Payer: Self-pay | Admitting: Radiology

## 2023-12-13 ENCOUNTER — Ambulatory Visit (HOSPITAL_BASED_OUTPATIENT_CLINIC_OR_DEPARTMENT_OTHER)
Admission: RE | Admit: 2023-12-13 | Discharge: 2023-12-13 | Disposition: A | Discharge status: Home / Self Care | Payer: 59 | Source: Ambulatory Visit | Attending: Family Medicine | Admitting: Family Medicine

## 2023-12-13 DIAGNOSIS — Z1231 Encounter for screening mammogram for malignant neoplasm of breast: Secondary | ICD-10-CM | POA: Diagnosis present

## 2024-03-27 ENCOUNTER — Other Ambulatory Visit (HOSPITAL_BASED_OUTPATIENT_CLINIC_OR_DEPARTMENT_OTHER): Payer: Self-pay | Admitting: Family Medicine

## 2024-03-27 DIAGNOSIS — E785 Hyperlipidemia, unspecified: Secondary | ICD-10-CM

## 2024-03-29 ENCOUNTER — Other Ambulatory Visit (HOSPITAL_COMMUNITY)
Admission: RE | Admit: 2024-03-29 | Discharge: 2024-03-29 | Disposition: A | Discharge status: Home / Self Care | Source: Ambulatory Visit | Attending: Obstetrics and Gynecology | Admitting: Obstetrics and Gynecology

## 2024-03-29 ENCOUNTER — Encounter: Payer: Self-pay | Admitting: Obstetrics and Gynecology

## 2024-03-29 ENCOUNTER — Ambulatory Visit: Admitting: Obstetrics and Gynecology

## 2024-03-29 VITALS — BP 118/80 | HR 77 | Ht 62.75 in | Wt 224.0 lb

## 2024-03-29 DIAGNOSIS — Z1331 Encounter for screening for depression: Secondary | ICD-10-CM

## 2024-03-29 DIAGNOSIS — Z8541 Personal history of malignant neoplasm of cervix uteri: Secondary | ICD-10-CM

## 2024-03-29 DIAGNOSIS — Z01419 Encounter for gynecological examination (general) (routine) without abnormal findings: Secondary | ICD-10-CM | POA: Diagnosis present

## 2024-03-29 DIAGNOSIS — Z1211 Encounter for screening for malignant neoplasm of colon: Secondary | ICD-10-CM

## 2024-03-29 NOTE — Progress Notes (Signed)
 53 y.o. y.o. female here for annual exam. Patient's last menstrual period was 02/14/2017.   [id number] Married White or Caucasian Not Hispanic or Latino female here for annual exam.  H/O TLH/BS for cervical cancer with Dr Beverly Beltran. Final pathology was stage 1A1 cervical cancer. No vaginal bleeding. No dyspareunia.    H/O MS, stable, normal bowel and bladder function.   She went from prediabetes to diabetes back to prediabetes. She has lost 15-20 lbs since 12/23. She cut out soda and cut back on carbs and decreased her portion size.   Patient's last menstrual period was 02/14/2017.          Sexually active: Yes.    The current method of family planning is status post hysterectomy.    Exercinone sing: No.  None  Smoker:  no  Health Maintenance: Pap:   01/04/2019 Neg HR HPV Neg,             12/14/2017 WNL NEG HPV              12-08-16 ASCUS + HR HPV colposcopy CIN III LEEP  suspicious for invasive cervical cancer- sent to  GYN oncology - had hysterectomy  History of abnormal Pap:  yes MMG:  12/13/23 density B Bi-rads 1 neg  BMD:   baseline scheduled 06/07/24 Colonoscopy: none referral placed TDaP:  09/29/21  Gardasil: n/a Body mass index is 40 kg/m.     03/29/2024    1:26 PM  Depression screen PHQ 2/9  Decreased Interest 0  Down, Depressed, Hopeless 0  PHQ - 2 Score 0    Blood pressure 118/80, pulse 77, height 5' 2.75 (1.594 m), weight 224 lb (101.6 kg), last menstrual period 02/14/2017, SpO2 99%.     Component Value Date/Time   DIAGPAP (A) 01/18/2022 1426    - Atypical squamous cells of undetermined significance (ASC-US )   DIAGPAP  01/14/2021 1046    - Negative for Intraepithelial Lesions or Malignancy (NILM)   DIAGPAP - Benign reactive/reparative changes 01/14/2021 1046   HPVHIGH Negative 01/18/2022 1426   HPVHIGH Negative 01/14/2021 1046   HPVHIGH Negative 01/08/2020 1105   ADEQPAP Satisfactory for evaluation. 01/18/2022 1426   ADEQPAP  01/14/2021 1046    Satisfactory  for evaluation; transformation zone component PRESENT.   ADEQPAP Satisfactory for evaluation. 01/08/2020 1105    GYN HISTORY:    Component Value Date/Time   DIAGPAP (A) 01/18/2022 1426    - Atypical squamous cells of undetermined significance (ASC-US )   DIAGPAP  01/14/2021 1046    - Negative for Intraepithelial Lesions or Malignancy (NILM)   DIAGPAP - Benign reactive/reparative changes 01/14/2021 1046   HPVHIGH Negative 01/18/2022 1426   HPVHIGH Negative 01/14/2021 1046   HPVHIGH Negative 01/08/2020 1105   ADEQPAP Satisfactory for evaluation. 01/18/2022 1426   ADEQPAP  01/14/2021 1046    Satisfactory for evaluation; transformation zone component PRESENT.   ADEQPAP Satisfactory for evaluation. 01/08/2020 1105    OB History  Gravida Para Term Preterm AB Living  3 2 2  0 1 2  SAB IAB Ectopic Multiple Live Births  0 0 0 0 0    # Outcome Date GA Lbr Len/2nd Weight Sex Type Anes PTL Lv  3 AB           2 Term           1 Term             Past Medical History:  Diagnosis Date   Anxiety    Cancer (  HCC) 01/2017   cervical cancer    Depression    Diabetes (HCC)    Genital warts    Hypercholesteremia    Hypertension    Multiple sclerosis (HCC) 01/2016   Multiple sclerosis (HCC)    Numbness    occassional numbnes in in extremities   Vision abnormalities     Past Surgical History:  Procedure Laterality Date   ABDOMINAL HYSTERECTOMY     CERVICAL BIOPSY  W/ LOOP ELECTRODE EXCISION     COLPOSCOPY     DILATION AND CURETTAGE OF UTERUS  1994   ROBOTIC ASSISTED TOTAL HYSTERECTOMY WITH BILATERAL SALPINGO OOPHERECTOMY Bilateral 02/22/2017   Procedure: XI ROBOTIC ASSISTED TOTAL HYSTERECTOMY WITH BILATERAL SALPINGECTOMY;  Surgeon: Beverly Aschoff, MD;  Location: [facility];  Service: Gynecology;  Laterality: Bilateral;    Current Outpatient Medications on File Prior to Visit  Medication Sig Dispense Refill   allopurinol  (ZYLOPRIM ) 100 MG tablet Take 100 mg by mouth daily.     cetirizine  (ZYRTEC) 10 MG tablet Take 10 mg by mouth at bedtime.      cyclobenzaprine  (FLEXERIL ) 5 MG tablet TAKE 1 TABLET BY MOUTH EVERYDAY AT BEDTIME 90 tablet 4   fluticasone (FLONASE) 50 MCG/ACT nasal spray Place 1 spray into both nostrils daily.     ibuprofen  (ADVIL ,MOTRIN ) 600 MG tablet Take 1 tablet (600 mg total) by mouth every 6 (six) hours as needed. 30 tablet 0   Multiple Vitamin (MULTI VITAMIN DAILY PO) Take by mouth.     pravastatin  (PRAVACHOL ) 20 MG tablet Take 20 mg by mouth at bedtime.   3   sertraline  (ZOLOFT ) 50 MG tablet TAKE 1 TABLET (50 MG) BY MOUTH DAILY AT NIGHT.  3   SODIUM FLUORIDE 5000 SENSITIVE 1.1-5 % GEL SMARTSIG:3.4 Unit(s) 2-3 Times Daily     valsartan -hydrochlorothiazide  (DIOVAN -HCT) 320-12.5 MG tablet Take 1 tablet by mouth daily.     Wheat Dextrin (BENEFIBER DRINK MIX PO)      No current facility-administered medications on file prior to visit.    Social History   Socioeconomic History   Marital status: Married    Spouse name: Not on file   Number of children: Not on file   Years of education: Not on file   Highest education level: Not on file  Occupational History   Not on file  Tobacco Use   Smoking status: Never   Smokeless tobacco: Never  Vaping Use   Vaping status: Never Used  Substance and Sexual Activity   Alcohol use: No   Drug use: No   Sexual activity: Yes    Partners: Male    Birth control/protection: Surgical    Comment: hysterectomy   Other Topics Concern   Not on file  Social History Narrative   Not on file   Social Drivers of Health   Financial Resource Strain: Not on file  Food Insecurity: Not on file  Transportation Needs: Not on file  Physical Activity: Not on file  Stress: Not on file  Social Connections: Not on file  Intimate Partner Violence: Not on file    Family History  Problem Relation Age of Onset   Diabetes Mellitus II Mother    COPD Father      No Known Allergies    Patient's last menstrual period was  Patient's last menstrual period was 02/14/2017.Beverly Beltran             Review of Systems Alls systems reviewed and are negative.     Physical Exam Constitutional:  Appearance: Normal appearance.  Genitourinary:     No lesions in the vagina.     No vaginal discharge, bleeding or granulation tissue.     Anterior vaginal prolapse present.    Mild vaginal atrophy present.     Right Adnexa: absent.    Left Adnexa: absent.    Cervix is absent.     Uterus is absent.   Musculoskeletal:     Cervical back: Normal range of motion.   Neurological:     Mental Status: She is alert and oriented to person, place, and time.   Psychiatric:        Mood and Affect: Mood normal.        Behavior: Behavior normal.  Vitals and nursing note reviewed.       A:         Well Woman GYN exam                             P:        Pap smear collected today Encouraged annual mammogram screening Colon cancer screening referral placed today DXA Scheduled for August Labs and immunizations to do with PMD Discussed breast self exams Encouraged healthy lifestyle practices Encouraged Vit D and Calcium   No follow-ups on file.  Reinaldo Caras

## 2024-04-03 ENCOUNTER — Ambulatory Visit: Payer: Self-pay | Admitting: Obstetrics and Gynecology

## 2024-04-03 LAB — CYTOLOGY - PAP
Adequacy: ABSENT
Diagnosis: NEGATIVE

## 2024-04-13 ENCOUNTER — Ambulatory Visit (HOSPITAL_BASED_OUTPATIENT_CLINIC_OR_DEPARTMENT_OTHER)
Admission: RE | Admit: 2024-04-13 | Discharge: 2024-04-13 | Disposition: A | Discharge status: Home / Self Care | Payer: Self-pay | Source: Ambulatory Visit | Attending: Family Medicine | Admitting: Family Medicine

## 2024-04-13 DIAGNOSIS — E785 Hyperlipidemia, unspecified: Secondary | ICD-10-CM | POA: Insufficient documentation

## 2024-06-07 ENCOUNTER — Ambulatory Visit (HOSPITAL_BASED_OUTPATIENT_CLINIC_OR_DEPARTMENT_OTHER)
Admission: RE | Admit: 2024-06-07 | Discharge: 2024-06-07 | Disposition: A | Discharge status: Home / Self Care | Source: Ambulatory Visit | Attending: Family Medicine | Admitting: Family Medicine

## 2024-06-07 DIAGNOSIS — E2839 Other primary ovarian failure: Secondary | ICD-10-CM | POA: Insufficient documentation

## 2024-06-08 ENCOUNTER — Other Ambulatory Visit: Payer: 59

## 2024-06-12 ENCOUNTER — Ambulatory Visit: Payer: 59 | Admitting: Neurology

## 2024-06-12 ENCOUNTER — Encounter: Payer: Self-pay | Admitting: Neurology

## 2024-06-12 VITALS — BP 128/79 | HR 75 | Ht 62.5 in | Wt 225.0 lb

## 2024-06-12 DIAGNOSIS — R5383 Other fatigue: Secondary | ICD-10-CM

## 2024-06-12 DIAGNOSIS — R7989 Other specified abnormal findings of blood chemistry: Secondary | ICD-10-CM

## 2024-06-12 DIAGNOSIS — Z79899 Other long term (current) drug therapy: Secondary | ICD-10-CM

## 2024-06-12 DIAGNOSIS — R202 Paresthesia of skin: Secondary | ICD-10-CM

## 2024-06-12 DIAGNOSIS — G35 Multiple sclerosis: Secondary | ICD-10-CM | POA: Diagnosis not present

## 2024-06-12 MED ORDER — CYCLOBENZAPRINE HCL 5 MG PO TABS: ORAL_TABLET | ORAL | 4 refills | Status: AC

## 2024-06-12 NOTE — Progress Notes (Signed)
 p  [facility]  PATIENT: Beverly Beltran DOB: [birth year]  REFERRING DOCTOR OR PCP:  Niels Roys SOURCE: Patient, ED and hospital notes in Epic, labs and imaging reports in Epic, personal review of MRI images on PACS.  _________________________________   HISTORICAL  CHIEF COMPLAINT:  Chief Complaint  Patient presents with   Follow-up    Pt in room 10.alone. Here for MS follow up. Patient reports doing well, no falls, last eye exam was 12/28/23. No concerns.     HISTORY OF PRESENT ILLNESS:  Beverly Beltran is a 53 y.o. woman with relapsing remitting MS.  Update 8/26//2025:  She had her Mavenclad Jan/Feb 2022 and 2023.    She had no side effects while taking the medication or afterwards.  She had CBC / DIff in 8/2//3023 (lymphocytes normal at 0.5). and  AST = 28 (normal) and ALT = 43 (slightly elevated).   Lymphocyte counts were up to 0.7 on 11/29/2022.and 1.0 11/2023  She feels her MS is stable.  Specifically she has no exacerbations or new symptoms.    Her gait is unchanged.   Occasional stumbles but no falls.     She can walk a couple  miles at a moderate pace but usually does not keep up with others (they also have longer legs)   She will hold the banniste ron stairs and tries not to carry items on stairs.   Strength is fine.  No spasticity. No major sensory issues   Bladder function is stale.  She has minimal stress incontinence with cough/sneeze - not bad enough to treat.  .   Vision is fine.   She now has bifocals  She has minimal fatigue.    Cognition and mood are fine.  She has occasional word finding difficulty.      She had cervical cancer stage FICO 1A1 and had a curative hysterectomy in 2018 thus she no longer has a cervix.    No skin lesions.    She had elevated HgbA1c and was able to lose 15 pounds with improvement.   She is trying to lose more weight to avoid starting aa DM med  Vit D was low normal in 09/2021.   She takes a MVI daily.  Her bone  density came back fine.     MS history: Around 01/28/2016, she had the onset of slurred speech and also felt more fatigue. Additionally, there was a dysesthetic numbness going into the left arm towards the thumb. She notes that she had just moved into a new house and had done quite a bit of activity over the preceding couple of weeks when symptoms did not improve after couple days, she went to the emergency room. There, she was admitted. The initial CT scan was normal but MRI of the brain was consistent with MS. MRI of the spine did not show any MS lesions but did show significant degenerative changes at C5-C6 as well as T7-T8, T8-T9 and T9-T10..   She received 5 days of steroids and was discharged home.  By the third or fourth day of IV steroids, symptoms were much better.   She was discharged after 5 days of steroids.  She entered the ALKS-8700 evolve study (comparison with Tecfidera  for 5 weeks) and the open label extension for the next 2 years.  Due to insurance issues, Vumerity was not available to her and she switched to Tecfidera  in 2019.  MRI of the brain in 2020 showed 1 breakthrough lesion.  MRI of  the brain in December 2021 showed a second breakthrough lesion.  After discussion of options, she opted to switch to [facility] December 2021  Imaging review:  MRI of the brain 02/03/2016 without contrast and 02/04/2016 with contrast show an enhancing large focus in the left frontal lobe, is also hyperintense on diffusion-weighted images. There are several other T2/FLAIR hyperintense foci in the periventricular, deep and juxtacortical white matter.   The MRI of the cervical spine 02/03/2016 showed normal spinal cord and shows degenerative changes at C5-C6 causing severe right and moderate left foraminal narrowing that could lead to right C6 nerve root compression there are milder degenerative changes at C3-C4 and C4-C5 with less potential for nerve root impingement.    MRI of the thoracic spine 02/03/2016  showed central disc extrusions at T7-T8, T8-T9 and T9-T10 deforming the spinal cord was in mild spinal stenosis of these levels. The spinal cord had normal signal.  MRI of the brain 04/22/2016, 06/09/2016, 05/11/2017 and 04/18/2018 with the drug study protocol showed no new lesions.  MRI of the brain 08/05/2019 shows multiple T2/FLAIR hyperintense foci in the periventricular, juxtacortical and deep white matter in a pattern and configuration consistent with chronic demyelinating plaque associated with multiple sclerosis.  None of these foci enhance or appear to be acute though one focus in the posterior left frontal lobe was not present on the MRI from 04/18/2018 and represents some disease activity.  MRI of the brain 09/20/2020 shows T2/FLAIR hyperintense foci in the cerebral hemispheres in a pattern and configuration consistent with chronic demyelinating plaque associated with multiple sclerosis. None of the foci were acute or enhance. However, one focus in the right frontal lobe seen on the current MRI was not present on the 08/05/2019 MRI and likely represents interval MS activity.  MRI of the brain 10/21/2021  and showed  Scattered T2/FLAIR hyperintense foci in the periventricular, juxtacortical and deep white matter of the hemispheres.  None of the foci appear to be acute.  They do not enhance.  Compared to the MRI from 09/18/2020, there are no new lesions.   No acute findings.  Normal enhancement pattern.  MRI of the brain 12/07/2022 was unchanged compared to the MRI from 10/21/2021.  MRI 12/06/2023 unchanged compared to 2024 MRI brain   REVIEW OF SYSTEMS: Constitutional: No fevers, chills, sweats, or change in appetite   she notes fatigue, insomnia and daytime hypersomnia Eyes: No visual changes, double vision, eye pain Ear, nose and throat: No hearing loss, ear pain, nasal congestion, sore throat Cardiovascular: No chest pain, palpitations Respiratory:  No shortness of breath at rest or with exertion.    No wheezes.   She snores GastrointestinaI: No nausea, vomiting, diarrhea, abdominal pain, fecal incontinence.  Some constipation Genitourinary:  No dysuria, urinary retention.  She has frequency and nocturia. Musculoskeletal:  No neck pain, back pain Integumentary: No rash, pruritus, skin lesions Neurological: as above Psychiatric: No depression at this time.  No anxiety now (had in past) Endocrine: No palpitations, diaphoresis, change in appetite, change in weigh or increased thirst Hematologic/Lymphatic:  No anemia, purpura, petechiae. Allergic/Immunologic: No itchy/runny eyes, nasal congestion, recent allergic reactions, rashes  ALLERGIES: No Known Allergies  HOME MEDICATIONS:  Current Outpatient Medications:    allopurinol  (ZYLOPRIM ) 100 MG tablet, Take 100 mg by mouth daily., Disp: , Rfl:    cetirizine (ZYRTEC) 10 MG tablet, Take 10 mg by mouth at bedtime. , Disp: , Rfl:    fluticasone (FLONASE) 50 MCG/ACT nasal spray, Place 1 spray into  both nostrils daily., Disp: , Rfl:    ibuprofen  (ADVIL ,MOTRIN ) 600 MG tablet, Take 1 tablet (600 mg total) by mouth every 6 (six) hours as needed., Disp: 30 tablet, Rfl: 0   Multiple Vitamin (MULTI VITAMIN DAILY PO), Take by mouth., Disp: , Rfl:    rosuvastatin (CRESTOR) 20 MG tablet, Take 20 mg by mouth daily., Disp: , Rfl:    sertraline  (ZOLOFT ) 50 MG tablet, TAKE 1 TABLET (50 MG) BY MOUTH DAILY AT NIGHT., Disp: , Rfl: 3   SODIUM FLUORIDE 5000 SENSITIVE 1.1-5 % GEL, SMARTSIG:3.4 Unit(s) 2-3 Times Daily, Disp: , Rfl:    valsartan -hydrochlorothiazide  (DIOVAN -HCT) 320-12.5 MG tablet, Take 1 tablet by mouth daily., Disp: , Rfl:    Wheat Dextrin (BENEFIBER DRINK MIX PO), , Disp: , Rfl:    cyclobenzaprine  (FLEXERIL ) 5 MG tablet, One po every day prn, Disp: 90 tablet, Rfl: 4   pravastatin  (PRAVACHOL ) 20 MG tablet, Take 20 mg by mouth at bedtime.  (Patient not taking: Reported on 06/12/2024), Disp: , Rfl: 3  PAST MEDICAL HISTORY: Past Medical History:   Diagnosis Date   Anxiety    Cancer (HCC) 01/2017   cervical cancer    Depression    Diabetes (HCC)    Genital warts    Hypercholesteremia    Hypertension    Multiple sclerosis (HCC) 01/2016   Multiple sclerosis (HCC)    Numbness    occassional numbnes in in extremities   Vision abnormalities     PAST SURGICAL HISTORY: Past Surgical History:  Procedure Laterality Date   ABDOMINAL HYSTERECTOMY     CERVICAL BIOPSY  W/ LOOP ELECTRODE EXCISION     COLPOSCOPY     DILATION AND CURETTAGE OF UTERUS  1994   ROBOTIC ASSISTED TOTAL HYSTERECTOMY WITH BILATERAL SALPINGO OOPHERECTOMY Bilateral 02/22/2017   Procedure: XI ROBOTIC ASSISTED TOTAL HYSTERECTOMY WITH BILATERAL SALPINGECTOMY;  Surgeon: Eloy Herring, MD;  Location: [facility];  Service: Gynecology;  Laterality: Bilateral;    FAMILY HISTORY: Family History  Problem Relation Age of Onset   Diabetes Mellitus II Mother    COPD Father     SOCIAL HISTORY:  Social History   Socioeconomic History   Marital status: Married    Spouse name: Not on file   Number of children: Not on file   Years of education: Not on file   Highest education level: Not on file  Occupational History   Not on file  Tobacco Use   Smoking status: Never   Smokeless tobacco: Never  Vaping Use   Vaping status: Never Used  Substance and Sexual Activity   Alcohol use: No   Drug use: No   Sexual activity: Yes    Partners: Male    Birth control/protection: Surgical    Comment: hysterectomy   Other Topics Concern   Not on file  Social History Narrative   Not on file   Social Drivers of Health   Financial Resource Strain: Not on file  Food Insecurity: Not on file  Transportation Needs: Not on file  Physical Activity: Not on file  Stress: Not on file  Social Connections: Not on file  Intimate Partner Violence: Not on file     PHYSICAL EXAM  Vitals:   06/12/24 1600  BP: 128/79  Pulse: 75  Weight: 225 lb (102.1 kg)  Height: 5' 2.5 (1.588 m)       Body mass index is 40.5 kg/m.  VISUAL Acuity:  20/20 OS  20/25 OD  General: The patient is well-developed and  well-nourished and in no acute distress   Neurologic Exam  Mental status: The patient is alert and oriented x 3 at the time of the examination. The patient has apparent normal recent and remote memory, with an apparently normal attention span and concentration ability.   Speech is normal.  Cranial nerves: Extraocular movements are full.   She has normal facial strength and sensation.  Trapezius strength is normal.. No obvious hearing deficits.  Motor:  Muscle bulk is normal.   Tone is normal. Strength is  5 / 5 in all 4 extremities.   Sensory: Sensory testing is intact to touch, temperature and vibration.   Coordination: Cerebellar testing reveals good finger-nose-finger and heel-to-shin bilaterally.  Gait and station: Station is normal.  The gait is normal.  Tandem gait is mildly wide.  SABRA  No Romberg sign.   Reflexes: Deep tendon reflexes are symmetric and normal bilaterally.          DIAGNOSTIC DATA (LABS, IMAGING, TESTING) - I reviewed patient records, labs, notes, testing and imaging myself where available.  Lab Results  Component Value Date   WBC 4.5 11/25/2023   HGB 13.7 11/25/2023   HCT 42.1 11/25/2023   MCV 84 11/25/2023   PLT 199 11/25/2023      Component Value Date/Time   NA 140 11/29/2022 1147   K 4.2 11/29/2022 1147   CL 102 11/29/2022 1147   CO2 21 11/29/2022 1147   GLUCOSE 122 (H) 11/29/2022 1147   GLUCOSE 154 (H) 02/23/2017 0426   BUN 10 11/29/2022 1147   CREATININE 0.64 11/29/2022 1147   CALCIUM 9.7 11/29/2022 1147   PROT 7.4 11/29/2022 1147   ALBUMIN 4.3 11/29/2022 1147   AST 37 11/29/2022 1147   ALT 43 (H) 11/29/2022 1147   ALKPHOS 86 11/29/2022 1147   BILITOT 0.3 11/29/2022 1147   GFRNONAA >60 02/23/2017 0426   GFRAA >60 02/23/2017 0426   ___________________________________________________   Multiple sclerosis  (HCC)  High risk medication use  Low serum vitamin D   Paresthesia  Other fatigue  1.  She completed the first year of Mavenclad in 2022 and second year in 2023.  Last MRI was stable.  In 2026, we will check MRI brain to determine if any subclinical progression.  If this is occurring, we will need to consider beginning and other disease modifying therapy 2.   Stay active and exercise as tolerated.   3.   return in 12 months or sooner if there are new or worsening neurologic symptoms.  Will check MRI after that visit.         Mylo Choi A. Vear, MD, PhD 06/12/2024, 4:16 PM Certified in Neurology, Clinical Neurophysiology, Sleep Medicine, Pain Medicine and Neuroimaging  [facility] [address] [place], [place] [address] [phone]

## 2025-04-01 ENCOUNTER — Ambulatory Visit: Admitting: Obstetrics and Gynecology

## 2025-06-12 ENCOUNTER — Ambulatory Visit: Admitting: Neurology
